# Patient Record
Sex: Male | Born: 1970 | Race: White | Hispanic: No | Marital: Married | Smoking: Former smoker
Health system: Southern US, Community
[De-identification: ages and names within clinical notes are randomized; demographics above are authoritative.]

## PROBLEM LIST (undated history)

## (undated) DIAGNOSIS — K219 Gastro-esophageal reflux disease without esophagitis: Secondary | ICD-10-CM

## (undated) DIAGNOSIS — G4733 Obstructive sleep apnea (adult) (pediatric): Secondary | ICD-10-CM

## (undated) DIAGNOSIS — M459 Ankylosing spondylitis of unspecified sites in spine: Secondary | ICD-10-CM

## (undated) DIAGNOSIS — M1990 Unspecified osteoarthritis, unspecified site: Secondary | ICD-10-CM

## (undated) DIAGNOSIS — F1910 Other psychoactive substance abuse, uncomplicated: Secondary | ICD-10-CM

## (undated) DIAGNOSIS — M542 Cervicalgia: Secondary | ICD-10-CM

## (undated) DIAGNOSIS — K7030 Alcoholic cirrhosis of liver without ascites: Secondary | ICD-10-CM

## (undated) DIAGNOSIS — M4810 Ankylosing hyperostosis [Forestier], site unspecified: Secondary | ICD-10-CM

## (undated) DIAGNOSIS — N200 Calculus of kidney: Secondary | ICD-10-CM

## (undated) DIAGNOSIS — E785 Hyperlipidemia, unspecified: Secondary | ICD-10-CM

## (undated) DIAGNOSIS — R011 Cardiac murmur, unspecified: Secondary | ICD-10-CM

## (undated) DIAGNOSIS — G4730 Sleep apnea, unspecified: Secondary | ICD-10-CM

## (undated) DIAGNOSIS — F329 Major depressive disorder, single episode, unspecified: Secondary | ICD-10-CM

## (undated) DIAGNOSIS — Z87442 Personal history of urinary calculi: Secondary | ICD-10-CM

## (undated) DIAGNOSIS — J302 Other seasonal allergic rhinitis: Secondary | ICD-10-CM

## (undated) DIAGNOSIS — U071 COVID-19: Secondary | ICD-10-CM

## (undated) DIAGNOSIS — R51 Headache: Secondary | ICD-10-CM

## (undated) DIAGNOSIS — G8929 Other chronic pain: Secondary | ICD-10-CM

## (undated) DIAGNOSIS — F419 Anxiety disorder, unspecified: Secondary | ICD-10-CM

## (undated) DIAGNOSIS — K769 Liver disease, unspecified: Secondary | ICD-10-CM

## (undated) DIAGNOSIS — T7840XA Allergy, unspecified, initial encounter: Secondary | ICD-10-CM

## (undated) DIAGNOSIS — M549 Dorsalgia, unspecified: Secondary | ICD-10-CM

## (undated) HISTORY — DX: Hyperlipidemia, unspecified: E78.5

## (undated) HISTORY — DX: Personal history of urinary calculi: Z87.442

## (undated) HISTORY — DX: Unspecified osteoarthritis, unspecified site: M19.90

## (undated) HISTORY — DX: Other seasonal allergic rhinitis: J30.2

## (undated) HISTORY — DX: COVID-19: U07.1

## (undated) HISTORY — DX: Liver disease, unspecified: K76.9

## (undated) HISTORY — DX: Dorsalgia, unspecified: M54.9

## (undated) HISTORY — PX: UPPER GASTROINTESTINAL ENDOSCOPY: SHX188

## (undated) HISTORY — DX: Alcoholic cirrhosis of liver without ascites: K70.30

## (undated) HISTORY — PX: ROTATOR CUFF REPAIR: SHX139

## (undated) HISTORY — DX: Headache: R51

## (undated) HISTORY — DX: Gastro-esophageal reflux disease without esophagitis: K21.9

## (undated) HISTORY — DX: Allergy, unspecified, initial encounter: T78.40XA

## (undated) HISTORY — DX: Obstructive sleep apnea (adult) (pediatric): G47.33

## (undated) HISTORY — DX: Calculus of kidney: N20.0

## (undated) HISTORY — DX: Cervicalgia: M54.2

## (undated) HISTORY — PX: COLONOSCOPY: SHX174

## (undated) HISTORY — DX: Other chronic pain: G89.29

## (undated) HISTORY — DX: Cardiac murmur, unspecified: R01.1

## (undated) HISTORY — DX: Other psychoactive substance abuse, uncomplicated: F19.10

## (undated) HISTORY — DX: Anxiety disorder, unspecified: F41.9

## (undated) HISTORY — DX: Major depressive disorder, single episode, unspecified: F32.9

## (undated) HISTORY — DX: Ankylosing hyperostosis (forestier), site unspecified: M48.10

## (undated) HISTORY — DX: Sleep apnea, unspecified: G47.30

## (undated) HISTORY — DX: Ankylosing spondylitis of unspecified sites in spine: M45.9

---

## 2001-02-26 ENCOUNTER — Encounter: Admission: RE | Admit: 2001-02-26 | Discharge: 2001-02-26 | Payer: Self-pay | Admitting: Family Medicine

## 2001-02-26 ENCOUNTER — Encounter: Payer: Self-pay | Admitting: Family Medicine

## 2004-10-09 ENCOUNTER — Emergency Department (HOSPITAL_COMMUNITY): Admission: EM | Admit: 2004-10-09 | Discharge: 2004-10-09 | Payer: Self-pay | Admitting: Emergency Medicine

## 2007-04-06 ENCOUNTER — Ambulatory Visit: Payer: Self-pay | Admitting: Internal Medicine

## 2007-09-21 ENCOUNTER — Ambulatory Visit: Payer: Self-pay | Admitting: Internal Medicine

## 2008-05-30 ENCOUNTER — Encounter: Payer: Self-pay | Admitting: Family Medicine

## 2008-06-22 ENCOUNTER — Ambulatory Visit: Payer: Self-pay | Admitting: Family Medicine

## 2008-06-25 DIAGNOSIS — Z87442 Personal history of urinary calculi: Secondary | ICD-10-CM | POA: Insufficient documentation

## 2008-06-25 DIAGNOSIS — J309 Allergic rhinitis, unspecified: Secondary | ICD-10-CM | POA: Insufficient documentation

## 2008-06-25 DIAGNOSIS — F411 Generalized anxiety disorder: Secondary | ICD-10-CM | POA: Insufficient documentation

## 2008-06-25 DIAGNOSIS — F329 Major depressive disorder, single episode, unspecified: Secondary | ICD-10-CM | POA: Insufficient documentation

## 2008-06-25 DIAGNOSIS — R519 Headache, unspecified: Secondary | ICD-10-CM | POA: Insufficient documentation

## 2008-06-25 DIAGNOSIS — R51 Headache: Secondary | ICD-10-CM | POA: Insufficient documentation

## 2008-06-25 DIAGNOSIS — F3289 Other specified depressive episodes: Secondary | ICD-10-CM | POA: Insufficient documentation

## 2008-06-25 DIAGNOSIS — K219 Gastro-esophageal reflux disease without esophagitis: Secondary | ICD-10-CM | POA: Insufficient documentation

## 2008-06-28 ENCOUNTER — Ambulatory Visit: Payer: Self-pay | Admitting: Family Medicine

## 2008-06-28 DIAGNOSIS — R5381 Other malaise: Secondary | ICD-10-CM | POA: Insufficient documentation

## 2008-06-28 DIAGNOSIS — R5383 Other fatigue: Secondary | ICD-10-CM

## 2008-07-03 ENCOUNTER — Encounter (INDEPENDENT_AMBULATORY_CARE_PROVIDER_SITE_OTHER): Payer: Self-pay | Admitting: *Deleted

## 2008-07-03 LAB — CONVERTED CEMR LAB
ALT: 55 units/L — ABNORMAL HIGH (ref 0–53)
AST: 34 units/L (ref 0–37)
Albumin: 4.7 g/dL (ref 3.5–5.2)
Alkaline Phosphatase: 33 units/L — ABNORMAL LOW (ref 39–117)
BUN: 11 mg/dL (ref 6–23)
Basophils Absolute: 0 10*3/uL (ref 0.0–0.1)
Basophils Relative: 0.4 % (ref 0.0–3.0)
Bilirubin, Direct: 0.1 mg/dL (ref 0.0–0.3)
CO2: 31 meq/L (ref 19–32)
Calcium: 9.5 mg/dL (ref 8.4–10.5)
Chloride: 107 meq/L (ref 96–112)
Cholesterol: 227 mg/dL — ABNORMAL HIGH (ref 0–200)
Creatinine, Ser: 0.9 mg/dL (ref 0.4–1.5)
Direct LDL: 171.6 mg/dL
Eosinophils Absolute: 0.3 10*3/uL (ref 0.0–0.7)
Eosinophils Relative: 5.2 % — ABNORMAL HIGH (ref 0.0–5.0)
GFR calc non Af Amer: 100.33 mL/min (ref >60)
Glucose, Bld: 95 mg/dL (ref 70–99)
HCT: 43.6 % (ref 39.0–52.0)
HDL: 45.2 mg/dL (ref >39.00)
Hemoglobin: 15.2 g/dL (ref 13.0–17.0)
Lymphocytes Relative: 30.4 % (ref 12.0–46.0)
Lymphs Abs: 1.5 10*3/uL (ref 0.7–4.0)
MCHC: 34.8 g/dL (ref 30.0–36.0)
MCV: 92.4 fL (ref 78.0–100.0)
Monocytes Absolute: 0.5 10*3/uL (ref 0.1–1.0)
Monocytes Relative: 10.3 % (ref 3.0–12.0)
Neutro Abs: 2.7 10*3/uL (ref 1.4–7.7)
Neutrophils Relative %: 53.7 % (ref 43.0–77.0)
Platelets: 208 10*3/uL (ref 150.0–400.0)
Potassium: 4.3 meq/L (ref 3.5–5.1)
RBC: 4.72 M/uL (ref 4.22–5.81)
RDW: 11.7 % (ref 11.5–14.6)
Sodium: 141 meq/L (ref 135–145)
Total Bilirubin: 0.9 mg/dL (ref 0.3–1.2)
Total CHOL/HDL Ratio: 5
Total Protein: 7.3 g/dL (ref 6.0–8.3)
Triglycerides: 92 mg/dL (ref 0.0–149.0)
VLDL: 18.4 mg/dL (ref 0.0–40.0)
WBC: 5 10*3/uL (ref 4.5–10.5)

## 2008-08-09 ENCOUNTER — Telehealth: Payer: Self-pay | Admitting: Family Medicine

## 2008-08-09 DIAGNOSIS — M79609 Pain in unspecified limb: Secondary | ICD-10-CM | POA: Insufficient documentation

## 2008-08-22 ENCOUNTER — Encounter: Payer: Self-pay | Admitting: Family Medicine

## 2008-09-05 ENCOUNTER — Ambulatory Visit: Payer: Self-pay | Admitting: Family Medicine

## 2008-09-05 DIAGNOSIS — J069 Acute upper respiratory infection, unspecified: Secondary | ICD-10-CM | POA: Insufficient documentation

## 2008-09-05 DIAGNOSIS — M542 Cervicalgia: Secondary | ICD-10-CM | POA: Insufficient documentation

## 2008-09-05 DIAGNOSIS — R21 Rash and other nonspecific skin eruption: Secondary | ICD-10-CM | POA: Insufficient documentation

## 2009-02-02 ENCOUNTER — Ambulatory Visit: Payer: Self-pay | Admitting: Family Medicine

## 2009-05-23 ENCOUNTER — Ambulatory Visit: Payer: Self-pay | Admitting: Family Medicine

## 2009-06-25 ENCOUNTER — Ambulatory Visit: Payer: Self-pay | Admitting: Family Medicine

## 2009-06-25 DIAGNOSIS — G4730 Sleep apnea, unspecified: Secondary | ICD-10-CM | POA: Insufficient documentation

## 2009-11-21 ENCOUNTER — Telehealth: Payer: Self-pay | Admitting: Family Medicine

## 2009-12-18 ENCOUNTER — Encounter: Payer: Self-pay | Admitting: Family Medicine

## 2009-12-28 ENCOUNTER — Telehealth: Payer: Self-pay | Admitting: Family Medicine

## 2010-01-17 ENCOUNTER — Ambulatory Visit: Payer: Self-pay | Admitting: Family Medicine

## 2010-03-19 NOTE — Assessment & Plan Note (Signed)
Summary: CHEST CONGESTION, COUGHING, SNEEZING, RUNNY NOSE / LFW   Vital Signs:  Patient profile:   40 year old male Height:      70.5 inches Weight:      208.25 pounds BMI:     29.56 Temp:     98.4 degrees F oral Pulse rate:   76 / minute Pulse rhythm:   regular BP sitting:   128 / 82  (left arm) Cuff size:   large  Vitals Entered By: Lewanda Rife LPN (February 02, 2009 2:12 PM)  CC:  chest congestion, sneezing, productive cough, and drainage in throat.  History of Present Illness: Here for chest and head congestion--onset x 5d--increased nasal congestion yesterday, no fever or chills, cough started keeping awake last night --has been using mucinex, IBP 800 two times a day as needed   Allergies: 1)  ! Omnicef  Review of Systems      See HPI  Physical Exam  General:  alert, well-developed, well-nourished, and well-hydrated.   Ears:  TMs retracted with increased fluid Nose:  no airflow obstruction, mucosal erythema, and mucosal edema.  sinuses neg Mouth:  no exudates and pharyngeal erythema.   Lungs:  moist cough only, no crackles and no wheezes.   Neurologic:  alert & oriented X3 and gait normal.   Cervical Nodes:  no anterior cervical adenopathy and no posterior cervical adenopathy.   Psych:  normally interactive and subdued.     Impression & Recommendations:  Problem # 1:  URI (ICD-465.9) continue comfort care measures: increase po fluids, rest, tylenol or IBP as needed delsym in daytime hycodan in evening see back if worsens His updated medication list for this problem includes:    Mucinex Dm Maximum Strength 60-1200 Mg Xr12h-tab (Dextromethorphan-guaifenesin) ..... Otc as directed.    Ibuprofen 200 Mg Tabs (Ibuprofen) ..... Otc as directed.  Complete Medication List: 1)  Flonase 50 Mcg/act Susp (Fluticasone propionate) .... Two sprays each nostril daily 2)  Cyclobenzaprine Hcl 10 Mg Tabs (Cyclobenzaprine hcl) .... 1/2 - 1  by mouth 3 times daily as needed for  back pain 3)  Hycodan Syrup  .Marland Kitchen.. 1 - 2 tsp by mouth at bedtime as needed cough 4)  Sertraline Hcl 50 Mg Tabs (Sertraline hcl) .... One by mouth daily 5)  Mucinex Dm Maximum Strength 60-1200 Mg Xr12h-tab (Dextromethorphan-guaifenesin) .... Otc as directed. 6)  Ibuprofen 200 Mg Tabs (Ibuprofen) .... Otc as directed. 7)  Hycodan  .Marland Kitchen.. 1-2 tsp at bedtime for cough Prescriptions: HYCODAN 1-2 tsp at bedtime for cough  #136ml x -   Entered and Authorized by:   Gildardo Griffes FNP   Signed by:   Gildardo Griffes FNP on 02/02/2009   Method used:   Print then Give to Patient   RxID:   [id number]   Current Allergies (reviewed today): ! OMNICEF

## 2010-03-19 NOTE — Progress Notes (Signed)
Summary: diclofenac  Phone Note Refill Request Message from:  [employer] on December 28, 2009 10:45 AM  Refills Requested: Medication #1:  Duckifebac Sodium EC 75   Supply Requested: 1 month   Last Refilled: 03/04/2009   Notes: Not [facility] list [facility] [phone]   Method Requested: Electronic Initial call taken by: Benny Lennert CMA Duncan Dull),  December 28, 2009 10:47 AM  Follow-up for Phone Call        voltaren 75 mg, 1 by mouth two times a day, #60, 1 refill  send in [facility] MD  December 28, 2009 11:16 AM   Additional Follow-up for Phone Call Additional follow up Details #1::        Rx called to pharmacy Additional Follow-up by: Benny Lennert CMA Duncan Dull),  December 28, 2009 11:24 AM     Prior Medications: FLONASE 50 MCG/ACT SUSP (FLUTICASONE PROPIONATE) two sprays each nostril daily CYCLOBENZAPRINE HCL 10 MG  TABS (CYCLOBENZAPRINE HCL) 1/2 - 1  by mouth 3 times daily as needed for back pain SERTRALINE HCL 50 MG TABS (SERTRALINE HCL) 2 tabs by mouth daily Current Allergies: ! OMNICEF

## 2010-03-19 NOTE — Assessment & Plan Note (Signed)
Summary: CONGESTION,ST/CLE   Vital Signs:  Patient profile:   40 year old male Weight:      203.50 pounds O2 Sat:      99 % on Room air Temp:     98.9 degrees F oral Pulse rate:   100 / minute Pulse rhythm:   regular Resp:     24 per minute BP sitting:   140 / 100  (left arm) Cuff size:   large  Vitals Entered By: Sydell Axon LPN (January 17, 2010 3:46 PM)  O2 Flow:  Room air CC: Top of mouth is sore, productive cough, head congestion and feels bad   History of Present Illness: Sxs began Sun evening. He then developed pain in the left side nof the roof of hius mouth yesterday. He has not had fever but has had chills. He is either burning up or ice cold. he has headache begiining Tues bitemporally/frontally, better today (last dose of Aleve yest afternoon), rhinitis that is clear, no ear pain, ST which is now gone except for the roof of his mouth, cough productive of cloudy material. He denies V, some mild nausea. He has taken Aleve for two days, Mucinex since Tues, last night got Mucinex DM for dry cough.  Problems Prior to Update: 1)  Sleep Apnea  (ICD-780.57) 2)  Neck Pain  (ICD-723.1) 3)  Rash-nonvesicular  (ICD-782.1) 4)  Uri  (ICD-465.9) 5)  Foot Pain  (ICD-729.5) 6)  Other Malaise and Fatigue  (ICD-780.79) 7)  Screening For Diabetes Mellitus  (ICD-V77.1) 8)  Screening For Lipoid Disorders  (ICD-V77.91) 9)  Nephrolithiasis, Hx of  (ICD-V13.01) 10)  Headache  (ICD-784.0) 11)  Gerd  (ICD-530.81) 12)  Depression  (ICD-311) 13)  Anxiety  (ICD-300.00) 14)  Allergic Rhinitis  (ICD-477.9) 15)  Health Maintenance Exam  (ICD-V70.0)  Medications Prior to Update: 1)  Flonase 50 Mcg/act Susp (Fluticasone Propionate) .... Two Sprays Each Nostril Daily 2)  Cyclobenzaprine Hcl 10 Mg  Tabs (Cyclobenzaprine Hcl) .... 1/2 - 1  By Mouth 3 Times Daily As Needed For Back Pain 3)  Sertraline Hcl 50 Mg Tabs (Sertraline Hcl) .... 2 Tabs By Mouth Daily  Current Medications  (verified): 1)  Flonase 50 Mcg/act Susp (Fluticasone Propionate) .... Two Sprays Each Nostril Daily 2)  Cyclobenzaprine Hcl 10 Mg  Tabs (Cyclobenzaprine Hcl) .... 1/2 - 1  By Mouth 3 Times Daily As Needed For Back Pain 3)  Sertraline Hcl 50 Mg Tabs (Sertraline Hcl) .... 2 Tabs By Mouth Daily 4)  C-Pap Machine .... Use At Nighjt 5)  Norco 5-325 Mg Tabs (Hydrocodone-Acetaminophen) .... Take One By Mouth Three Times A Day As Needed 6)  Kenalog 40 Mg/ml Susp (Triamcinolone Acetonide)  Allergies: 1)  ! Omnicef  Physical Exam  General:  Well-developed,well-nourished,in no acute distress; alert,appropriate and cooperative throughout examination Head:  Normocephalic and atraumatic without obvious abnormalities. No apparent alopecia or balding. Sinuses NT. Eyes:  Conjunctiva clear bilaterally.  Ears:  External ear exam shows no significant lesions or deformities.  Otoscopic examination reveals clear canals, tympanic membranes are intact bilaterally without bulging, retraction, inflammation or discharge. Hearing is grossly normal bilaterally. Nose:  External nasal examination shows no deformity or inflammation. Nasal mucosa are pink and moist without lesions or exudates. Mouth:  No PND or pharyngeal exudates. Left side of posterioir portion of hard palate eryhtem with early formation of apthous ulcer. Neck:  No deformities, masses, or tenderness noted. Lungs:  Normal respiratory effort, chest expands symmetrically. Lungs are clear  to auscultation, no crackles or wheezes. Heart:  Normal rate and regular rhythm. S1 and S2 normal without gallop, murmur, click, rub or other extra sounds. Cervical Nodes:  No lymphadenopathy noted   Impression & Recommendations:  Problem # 1:  URI (ICD-465.9) Assessment Deteriorated Recuurent, actually looks like developing flu.  See instructions. He has had this long enough that I do not think antiviral would help.   Complete Medication List: 1)  Flonase 50  Mcg/act Susp (Fluticasone propionate) .... Two sprays each nostril daily 2)  Cyclobenzaprine Hcl 10 Mg Tabs (Cyclobenzaprine hcl) .... 1/2 - 1  by mouth 3 times daily as needed for back pain 3)  Sertraline Hcl 50 Mg Tabs (Sertraline hcl) .... 2 tabs by mouth daily 4)  C-pap Machine  .... Use at nighjt 5)  Norco 5-325 Mg Tabs (Hydrocodone-acetaminophen) .... Take one by mouth three times a day as needed 6)  Kenalog 40 Mg/ml Susp (Triamcinolone acetonide)  Patient Instructions: 1)  Take Guaifenesin by [facility] or [facility] and getting MUCOUS RELIEF EXPECTORANT (400mg ), take 11/2 tabs by mouth AM and NOON. 2)  Drink lots of fluids anytime taking Guaifenesin.  3)  Aleve 2 tabs by mouth after brfst and supper. 4)  Kenalog in orabase to roof of mouth. 5)  Delsym for cough at night as needed.   Orders Added: 1)  Est. Patient Level III [65784]    Current Allergies (reviewed today): ! OMNICEF

## 2010-03-19 NOTE — Progress Notes (Signed)
Summary: cyclobenzaprine  Phone Note Refill Request Message from:  Fax from Pharmacy on November 21, 2009 10:00 AM  Refills Requested: Medication #1:  CYCLOBENZAPRINE HCL 10 MG  TABS 1/2 - 1  by mouth 3 times daily as needed for back pain   Last Refilled: 07/02/2009 Refill request from [facility] [address] [phone]  Initial call taken by: Melody Comas,  November 21, 2009 10:01 AM    Prescriptions: CYCLOBENZAPRINE HCL 10 MG  TABS (CYCLOBENZAPRINE HCL) 1/2 - 1  by mouth 3 times daily as needed for back pain  #40 x 2   Entered and Authorized by:   Hannah Beat MD   Signed by:   Hannah Beat MD on 11/21/2009   Method used:   [facility]  [address] [phone]       [address]       [place], [place]  [address]       Ph: [phone] or [phone]       Fax: [phone]   RxID:   [id number]

## 2010-03-19 NOTE — Letter (Signed)
Summary: Generic Letter  [facility] at [facility]  [address] [place], [place] [address]   Phone: [phone]  Fax: [phone]    07/03/2008     HENDRY SPEAS [address] [place], [place]  [address]     Dear Mr. WAMPOLE,     [place] labs look OK, cholesterol panel is high.  Cholesterol Panel Total Cholesterol: 227 LDL (Bad cholesterol): 171 HDL (Good cholesterol): 45 Triglyerides: 92  With your age and risk factors, reasonable to start with aggressive diet and exercise changes. Add Fish oil 2 grams a day to diet. Work on your diet: Try to eat minimal fatty foods, plenty of fiber, fruit, and vegetables. Exercise is incredibly important to heart health: Try to exercise for at least 30 minutes 5-6 times a week.   No diabetes, liver, or kidney problems, no anemia.  Take care.       Sincerely,   Hannah Beat MD

## 2010-03-19 NOTE — Consult Note (Signed)
Summary: [facility]   Imported By: Lanelle Bal 09/05/2008 09:06:20  _____________________________________________________________________  External Attachment:    Type:   Image     Comment:   External Document

## 2010-03-19 NOTE — Letter (Signed)
Summary: [facility]/Notes from 07/09/06-05/30/08/Immunizat  [facility]/Notes from 07/09/06-05/30/08/Immunizations/Dr. Raupp   Imported By: Eleonore Chiquito 07/05/2008 16:28:02  _____________________________________________________________________  External Attachment:    Type:   Image     Comment:   External Document

## 2010-03-19 NOTE — Assessment & Plan Note (Signed)
Summary: NEW PT TO EST/CLE   Vital Signs:  Patient profile:   40 year old male Height:      70.5 inches Weight:      187.25 pounds BMI:     26.58 Temp:     98.5 degrees F oral Pulse rate:   80 / minute Pulse rhythm:   regular BP sitting:   128 / 80  (left arm) Cuff size:   large  Vitals Entered By: Lewanda Rife (Jun 22, 2008 11:01 AM)  History of Present Illness: chief complaint: new patient  40 year old male: the patient is here for a complete physical examination. And well maintenance exam.  Anxiety, has been on Zoloft, for a lont time and it changed his life. Was off for four or four years, tried cymbalta, lexapro. All had bad side effects.  now, he is able to tolerate taking Zoloft 100 mg daily, he is having some problems with prolonged intercourse and occasional lengthening of time to organism. To him the benefit has outweighed the risk.  Otherwise we're here to do a full complete examination, and we'll review his preventative services needed, and reviewed his past medical history, family history, social history surgical history, and doing a complete physical examination.  He thinks he has had Td in the past 2-3 years from an accident, but not sure of date. We will obtain records from the patient's prior physicians.   Preventive Screening-Counseling & Management     Alcohol drinks/day: <1     Alcohol type: beer     >5/day in last 3 mos: no     Alcohol Counseling: not indicated; use of alcohol is not excessive or problematic     Smoking Status: quit     Smoking Cessation Counseling: no     Year Quit: 2002     Pack years: 7     Cans of tobacco/week: 0     Tobacco Counseling: to remain off tobacco products     Diet Comments: suboptimal     Diet Counseling: to improve diet; diet is suboptimal     Does Patient Exercise: no     Exercise Counseling: to improve exercise regimen     Hepatitis Risk: no risk noted     Hepatitis Risk Counseling: not indicated-no hepatitis risk  noted     HIV Risk: no risk noted     HIV Risk Counseling: not indicated-no HIV risk noted     STD Risk: no risk noted     STD Risk Counseling: not indicated-no STD risk noted     Contraception Counseling: not indicated; no questions/concerns expressed     Violence in the Home: no risk noted     Sexual Abuse: no     Fall Risk Counseling: not indicated; no significant falls noted      Sexual History:  currently monogamous.        Drug Use:  never and no.        Blood Transfusions:  no.    Allergies (verified): 1)  ! Omnicef (Cefdinir)  Past History:  Past Medical History:    Allergic rhinitis    Anxiety    Depression    GERD    Headache    Nephrolithiasis, hx of    Chronic neck and back pain  Past Surgical History:    no  Family History:    Alcoholism, Drug Addiction: 0    Colon CA: 0    Ovarian/Uterine CA: 0  Breast CA: 0    Lung CA: 0    Prostate CA: 0    CAD: P    CVA: GP    Sudden death < 50: 0    DM: P    Mental Illness: 0     HTN: +  Social History:    Psychiatric nurse, [employer]    Quit smoking 2002, about 7 pack years    Alcohol use-yes    Drug use-no    Regular exercise-no    Drug Use:  never, no    Does Patient Exercise:  no    Smoking Status:  quit    Hepatitis Risk:  no risk noted    HIV Risk:  no risk noted    STD Risk:  no risk noted    Sexual History:  currently monogamous    Blood Transfusions:  no  Review of Systems  General: Denies fever, chills, sweats, anorexia, fatigue, weakness, malaise Eyes: Denies blurring, vision loss ENT: Denies earache, nasal congestion, nosebleeds, sore throat, and hoarseness.  Cardiovascular: Denies chest pains, palpitations, syncope, dyspnea on exertion,  Respiratory: Denies cough, dyspnea at rest, excessive sputum,wheeezing GI: Denies nausea, vomiting, diarrhea, constipation, change in bowel habits, abdominal pain, melena, hematochezia GU: Denies dysuria, hematuria, discharge, urinary  frequency, urinary hesitancy, nocturia, incontinence, genital sores, decreased libido - DOES HAVE SOME ANORGASMIA WITH THE ANTIDEPRESSANT USE Musculoskeletal: Denies back pain, joint pain Derm: Denies rash, itching Neuro: Denies  paresthesias, frequent falls, frequent headaches, and difficulty walking.  Psych: Denies depression, anxiety Endocrine: Denies cold intolerance, heat intolerance, polydipsia, polyphagia, polyuria, and unusual weight change.  Heme: Denies enlarged lymph nodes Allergy: No hayfever   Otherwise, the pertinent positives and negatives are listed above and in the HPI, otherwise a full review of systems has been reviewed and is negative unless noted positive.    Impression & Recommendations:  Problem # 1:  HEALTH MAINTENANCE EXAM (ICD-V70.0) Assessment New The patient's preventative maintenance and recommended screening tests for an annual wellness exam were reviewed in full today, and the patient was brought up to date and current.  Reviewed the [employer], Prevention tab. Up to Date.  Counselled on the importance of diet, exercise, and its role in overall health and mortality. The patient's FH and SH was reviewed, including their home life, tobacco status, and drug and alcohol status.  Also given refills on Flonase and Zoloft.  follow-up in 1 year.   Complete Medication List: 1)  Sertraline Hcl 100 Mg Tabs (Sertraline hcl) .Marland Kitchen.. 1 by mouth daily 2)  Flonase 50 Mcg/act Susp (Fluticasone propionate) .... Two sprays each nostril daily  Patient Instructions: 1)  Please make a future lab appointment: Stop at the front desk on your way out to set it up. You can come in that day directly and will not have to see the doctor. 2)  future fasting lipid panel 3)  v77.91  4)  BMP: v77.1 5)  Hepatic Function Panel: v58.69 6)  CBC with diff: 780.79 7)  TSH: 780.79  8)  follow-up as needed Prescriptions: FLONASE 50 MCG/ACT SUSP (FLUTICASONE PROPIONATE) two sprays each  nostril daily  #3 x 3   Entered and Authorized by:   Hannah Beat MD   Signed by:   Hannah Beat MD on 06/22/2008   Method used:   Print then Give to Patient   RxID:   [id number] SERTRALINE HCL 100 MG TABS (SERTRALINE HCL) 1 by mouth daily  #90 x 3   Entered  and Authorized by:   Hannah Beat MD   Signed by:   Hannah Beat MD on 06/22/2008   Method used:   Print then Give to Patient   RxID:   [id number]     Current Allergies (reviewed today): ! OMNICEF (CEFDINIR)  Review of  Systems General: denies fatigue, malaise, fever, weight loss Eyes: denies blurring, diplopia, irritation, discharge Ear/Nose/Throat: denies ear pain or discharge, nasal obstruction or discharge, sore throat Cardiovascular: denies chest pain, palpitations, paroxysmal nocturnal dyspnea, orthopnea, edema Respiratory: denies coughing, wheezing, dyspnea, hemoptysis Gastrointestinal: denies abdominal pain, dysphagia, nausea, vomiting, diarrhea, constipation Genitourinary: denies hematuria, frequency, urgency, dysuria, discharge, O/W ANORGASMIA AS PER HPI Musculoskeletal: denies back pain, joint swelling, joint stiffness, joint pain Skin: denies rashes, itching, lumps, sores, lesions, color change Neurologic: denies syncope, seizures, transient paralysis, weakness, paresthesias Psychiatric: denies depression, anxiety, mental disturbance, difficulty sleeping, suicidal ideation, hallucinations, paranoia Endocrine: denies polyuria, polydipsia, polyphagia, weight change, heat or cold intolerance Heme/Lymphatic: denies easy or excessive bruising, history of blood transfusions, anemia, bleeding disorders, adenopathy, chills, sweats Allergic/Immunologic: HAS SOME ALLERGIES, RELATIVELY STABLE WITH FLONASE AND OTC MEDS, denies frequent UTIs; denies HIV high risk behaviors   Physical Exam General Appearance: well developed, well nourished, no acute distress Eyes: conjunctiva and lids normal, PERRLA,  EOMI, fundi WNL Ears, Nose, Mouth, Throat: TM clear, nares clear, oral exam WNL Neck: supple, no lymphadenopathy, no thyromegaly, no JVD Respiratory: clear to auscultation and percussion, respiratory effort normal Cardiovascular: regular rate and rhythm, S1-S2, no murmur, rub or gallop, no bruits, peripheral pulses normal and symmetric, no cyanosis, clubbing, edema or varicosities Chest: no scars, masses, tenderness; no asymmetry, skin changes, nipple discharge, no gynecomastia   Gastrointestinal: soft, non-tender; no hepatosplenomegaly, masses; active bowel sounds all quadrants, guaiac negative stool; no masses, tenderness, hemorrhoids  Genitourinary: deferred Lymphatic: no cervical, axillary or inguinal adenopathy Musculoskeletal: gait normal, muscle tone and strength WNL, no joint swelling, effusions, discoloration, crepitus  Skin: clear, good turgor, color WNL, no rashes, lesions, or ulcerations Neurologic: normal mental status, normal reflexes, normal strength, sensation, and motion Psychiatric: alert; oriented to person, place and time Other Exam:

## 2010-03-19 NOTE — Assessment & Plan Note (Signed)
Summary: FEVER   Vital Signs:  Patient profile:   40 year old male Height:      70.5 inches Weight:      201.6 pounds BMI:     29.56 Temp:     98.0 degrees F oral Pulse rate:   76 / minute Pulse rhythm:   regular BP sitting:   110 / 70  (left arm) Cuff size:   large  Vitals Entered By: Benny Lennert CMA Duncan Dull) (May 23, 2009 3:38 PM)  History of Present Illness: Chief complaint fever,extreme fatigue and vomited once  40 year old male:  Feeling some extreme fatigue, aches, pains.  Threw up on saturday night.   Temperature has been as high as 100. Woke up this morning, felt like there was something in his left ear.   No congestion. A little cough, but has had some allergies.   Temperatire and felt fatigued.  Came on and has been feeling really tired.   has had a lot of muscle aches on the left side.    Acute Visit History:      The patient complains of earache, fever, headache, musculoskeletal symptoms, nausea, and vomiting.  These symptoms began 5 days ago.  He denies chest pain, cough, diarrhea, eye symptoms, genitourinary symptoms, nasal discharge, rash, sinus problems, and sore throat.        The earache is located on the left side.        Urine output has been normal.  He is tolerating clear liquids.        Allergies: 1)  ! Omnicef  Past History:  Past medical, surgical, family and social histories (including risk factors) reviewed, and no changes noted (except as noted below).  Past Medical History: Reviewed history from 06/22/2008 and no changes required. Allergic rhinitis Anxiety Depression GERD Headache Nephrolithiasis, hx of Chronic neck and back pain  Past Surgical History: Reviewed history from 06/22/2008 and no changes required. no  Family History: Reviewed history from 06/22/2008 and no changes required. Alcoholism, Drug Addiction: 0 Colon CA: 0 Ovarian/Uterine CA: 0 Breast CA: 0 Lung CA: 0 Prostate CA: 0 CAD: P CVA: GP Sudden death  < 50: 0 DM: P Mental Illness: 0  HTN: +  Social History: Reviewed history from 06/22/2008 and no changes required. Psychiatric nurse, [employer] Quit smoking 2002, about 7 pack years Alcohol use-yes Drug use-no Regular exercise-no  Review of Systems       REVIEW OF SYSTEMS GEN: Acute illness details above. CV: No chest pain or SOB GI: as above Otherwise, pertinent positives and negatives are noted in the HPI.   Physical Exam  Additional Exam:  Gen: WDWN, NAD; A & O x3, cooperative. Pleasant.Globally Non-toxic HEENT: Normocephalic and atraumatic. Throat clear, w/o exudate, R TM clear, L TM - good landmarks, No fluid present. no rhinnorhea. No frontal or maxillary sinus T. MMM NECK: Anterior cervical  LAD is present CV: RRR, No M/G/R, cap refill <2 sec PULM: Breathing comfortably in no respiratory distress. no wheezing, crackles, rhonchi ABD: S,NT,ND,+BS. No HSM. No rebound. EXT: No c/c/e PSYCH: Friendly, good eye contact MSK: Nml gait    Impression & Recommendations:  Problem # 1:  VIRAL INFECTION-UNSPEC (ICD-079.99) Assessment New H1N1 flu? arthralgias predominant, some fever, nausea will treat for tick borne illness with doxy - spring in [place]  The following medications were removed from the medication list:    Mucinex Dm Maximum Strength 60-1200 Mg Xr12h-tab (Dextromethorphan-guaifenesin) ..... Otc as directed.  Ibuprofen 200 Mg Tabs (Ibuprofen) ..... Otc as directed.  Problem # 2:  FEVER (ICD-780.60) Assessment: New  Complete Medication List: 1)  Flonase 50 Mcg/act Susp (Fluticasone propionate) .... Two sprays each nostril daily 2)  Cyclobenzaprine Hcl 10 Mg Tabs (Cyclobenzaprine hcl) .... 1/2 - 1  by mouth 3 times daily as needed for back pain 3)  Sertraline Hcl 50 Mg Tabs (Sertraline hcl) .... 2 tabs by mouth daily 4)  Doxycycline Hyclate 100 Mg Caps (Doxycycline hyclate) .... Take 1 tab twice a day Prescriptions: SERTRALINE HCL 50 MG TABS  (SERTRALINE HCL) 2 tabs by mouth daily  #60 x 3   Entered and Authorized by:   Hannah Beat MD   Signed by:   Hannah Beat MD on 05/23/2009   Method used:   Electronically to        [facility]  [address] [phone]* (retail)       [address]       [place], [place]  [address]       Ph: [phone] or [phone]       Fax: [phone]   RxID:   [id number] DOXYCYCLINE HYCLATE 100 MG CAPS (DOXYCYCLINE HYCLATE) Take 1 tab twice a day  #20 x 0   Entered and Authorized by:   Hannah Beat MD   Signed by:   Hannah Beat MD on 05/23/2009   Method used:   Electronically to        [facility]  [address] [phone]* (retail)       [address]       [place], [place]  [address]       Ph: [phone] or [phone]       Fax: [phone]   RxID:   [id number]   Current Allergies (reviewed today): ! OMNICEF

## 2010-03-19 NOTE — Assessment & Plan Note (Signed)
Summary: NECK PAIN,ST,COUGH,BITES ON LEG/CLE   Vital Signs:  Patient profile:   40 year old male Height:      70.5 inches Weight:      195.4 pounds BMI:     27.74 Temp:     98.0 degrees F oral Pulse rate:   80 / minute Pulse rhythm:   regular BP sitting:   140 / 90  (left arm) Cuff size:   regular  Vitals Entered By: Benny Lennert CMA (September 05, 2008 12:21 PM)  History of Present Illness: Chief complaint neck pain times 2 weeks/sorethroat and cough since saturday/ bites on legs for 1 and half weeks  40 year old male:  Started to have a sore throat on saturday. Cough and mucinex DM also has some nasal congestion. He does not have any known sick exposure. He is able to eat and drink, though diminished somewhat. No nausea, vomiting, diarrhea, and rashes are detailed below. Has had some headaches and back pain and some pain   rash: Intermittent very small papules will ask, subsequently these become highly pruritic and the patient will scratch them to the point of bleeding. No known exposure, though the patient is frequently outside gardening.  Neck pain: Patient does have intermittent neck pain 3-4 times a year, he has been trying some heat, some stretching and modalities he has a home. No radiculopathy or weakness.  Allergies: 1)  ! Omnicef (Cefdinir)  Past History:  Past medical, surgical, family and social histories (including risk factors) reviewed, and no changes noted (except as noted below).  Past Medical History: Reviewed history from 06/22/2008 and no changes required. Allergic rhinitis Anxiety Depression GERD Headache Nephrolithiasis, hx of Chronic neck and back pain  Past Surgical History: Reviewed history from 06/22/2008 and no changes required. no  Family History: Reviewed history from 06/22/2008 and no changes required. Alcoholism, Drug Addiction: 0 Colon CA: 0 Ovarian/Uterine CA: 0 Breast CA: 0 Lung CA: 0 Prostate CA: 0 CAD: P CVA:  GP Sudden death < 50: 0 DM: P Mental Illness: 0  HTN: +  Social History: Reviewed history from 06/22/2008 and no changes required. 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 ENT:  Complains of nasal congestion. CV:  Denies chest pain or discomfort. Resp:  Complains of cough; denies shortness of breath, sputum productive, and wheezing. GI:  Denies nausea and vomiting. GU:  Denies dysuria. MS:  Complains of stiffness. Derm:  Complains of rash.  Physical Exam  General:  Well-developed,well-nourished,in no acute distress; alert,appropriate and cooperative throughout examination Head:  Normocephalic and atraumatic without obvious abnormalities. No apparent alopecia or balding. Eyes:  vision grossly intact.   Ears:  External ear exam shows no significant lesions or deformities.  Otoscopic examination reveals clear canals, tympanic membranes are intact bilaterally without bulging, retraction, inflammation or discharge. Hearing is grossly normal bilaterally. Nose:  no external deformity.   Mouth:  Oral mucosa and oropharynx without lesions or exudates.  Teeth in good repair. Neck:  No deformities, masses, or tenderness noted. Lungs:  Normal respiratory effort, chest expands symmetrically. Lungs are clear to auscultation, no crackles or wheezes. Heart:  Normal rate and regular rhythm. S1 and S2 normal without gallop, murmur, click, rub or other extra sounds. Abdomen:  Bowel sounds positive,abdomen soft and non-tender without masses, organomegaly or hernias noted. Msk:  mild limitation of cervical neck range of motion with terminal lateral flexion, flexion, extension, and rotational motions. Spurling's test is negative. Nontender along cervical spine itself. Mildly tender along the paraspinous musculature in upper neck and  trapezius. Extremities:  No clubbing, cyanosis, edema, or deformity noted with normal full range of motion of all joints.   Neurologic:  alert & oriented X3, sensation intact to light touch, and gait normal.   Skin:  very small papules with evidence of excoriation present on the bilateral lower extremities including tops of feet, also present on the distal forearms. There is no evidence of burrowing or evidence in the interdigital spaces. Cervical Nodes:  No lymphadenopathy noted Psych:  Cognition and judgment appear intact. Alert and cooperative with normal attention span and concentration. No apparent delusions, illusions, hallucinations   Impression & Recommendations:  Problem # 1:  URI (ICD-465.9) Assessment New Supportive care Hycodan as needed at night Nontoxic and AF  His updated medication list for this problem includes:    Diclofenac Sodium 75 Mg Tbec (Diclofenac sodium) .Marland Kitchen... 1 by mouth bid. take with food  Problem # 2:  RASH-NONVESICULAR (ICD-782.1) Assessment: New appears most look like small insect bites, such as fleas or possibly chigger bites.  TAC to bites, [place] with going outside  His updated medication list for this problem includes:    Triamcinolone Acetonide 0.1 % Crea (Triamcinolone acetonide) .Marland Kitchen... Apply bid to affected area  Problem # 3:  NECK PAIN (ICD-723.1) Assessment: New Heat, ROM, NSAIDS, muscle relaxants  no concern for disk herniation  His updated medication list for this problem includes:    Diclofenac Sodium 75 Mg Tbec (Diclofenac sodium) .Marland Kitchen... 1 by mouth bid. take with food    Cyclobenzaprine Hcl 10 Mg Tabs (Cyclobenzaprine hcl) .Marland Kitchen... 1/2 - 1  by mouth 3 times daily as needed for back pain  Complete Medication List: 1)  Sertraline Hcl 100 Mg Tabs (Sertraline hcl) .Marland Kitchen.. 1 by mouth daily 2)  Flonase 50 Mcg/act Susp (Fluticasone propionate) .... Two sprays each nostril daily 3)  Triamcinolone Acetonide 0.1 % Crea (Triamcinolone acetonide) ....  Apply bid to affected area 4)  Diclofenac Sodium 75 Mg Tbec (Diclofenac sodium) .Marland Kitchen.. 1 by mouth bid. take with food 5)  Cyclobenzaprine Hcl 10 Mg Tabs (Cyclobenzaprine hcl) .... 1/2 - 1  by mouth 3 times daily as needed for back pain 6)  Hycodan Syrup  .Marland Kitchen.. 1 - 2 tsp by mouth at bedtime as needed cough Prescriptions: HYCODAN SYRUP 1 - 2 tsp by mouth at bedtime as needed cough  #8 oz x 0   Entered and Authorized by:   Hannah Beat MD   Signed by:   Hannah Beat MD on 09/05/2008   Method used:   Print then Give to Patient   RxID:   [id number] CYCLOBENZAPRINE HCL 10 MG  TABS (CYCLOBENZAPRINE HCL) 1/2 - 1  by mouth 3 times daily as needed for back pain  #40 x 2   Entered and Authorized by:   Hannah Beat MD   Signed by:   Hannah Beat MD on 09/05/2008   Method used:   Print then Give to Patient   RxID:   [id number] DICLOFENAC SODIUM 75 MG TBEC (DICLOFENAC SODIUM) 1 by mouth bid. take with food  #60 x 3   Entered and Authorized by:   Hannah Beat MD   Signed by:   Hannah Beat MD on 09/05/2008   Method used:   Print then Give to Patient  RxID:   [id number] TRIAMCINOLONE ACETONIDE 0.1 % CREA (TRIAMCINOLONE ACETONIDE) apply bid to affected area  #1 pound jar x 0   Entered and Authorized by:   Hannah Beat MD   Signed by:   Hannah Beat MD on 09/05/2008   Method used:   Print then Give to Patient   RxID:   [id number]   Current Allergies (reviewed today): ! OMNICEF (CEFDINIR)

## 2010-03-19 NOTE — Progress Notes (Signed)
Summary: wants podiatry referral  Phone Note Call from Patient Call back at [phone]   Caller: Patient Call For: Hannah Beat MD Summary of Call: pt c/o R foot pain he wants referral to podiatrist. Initial call taken by: Liane Comber,  August 09, 2008 1:09 PM  Follow-up for Phone Call        ok Follow-up by: Hannah Beat MD,  August 09, 2008 1:37 PM  New Problems: FOOT PAIN (ICD-729.5)   New Problems: FOOT PAIN (ICD-729.5)

## 2010-03-19 NOTE — Assessment & Plan Note (Signed)
Summary: snoring issues   Vital Signs:  Patient profile:   40 year old male Height:      70.5 inches Weight:      203.2 pounds BMI:     28.85 Temp:     98.1 degrees F oral Pulse rate:   76 / minute Pulse rhythm:   regular BP sitting:   110 / 80  (left arm) Cuff size:   regular  Vitals Entered By: Benny Lennert CMA Duncan Dull) (Jun 25, 2009 12:28 PM)  CC:  evaluation of snoring for initial evaluation.  History of Present Illness: Chief complaint snoring issues  39 year old male:  Sleeping 10 hours if he can on average sleeps 7 or 8         This is a 39 year old male who presents for consultation at the request of his referring provider for evaluation of snoring for initial evaluation.  The patient presents with snoring, sleep apnea, daytime fatigue, post nasal drainage, runny nose, and stuffiness, but has no history of night time gasping, choking at night, periods of breath holding, sore throat, hoarseness, voice change/problems, hearing loss, and dizziness.  The patient has a positive history of allergies.  Previous evaluation and treatment has included antihistamines:, decongestants:, OTC nasal sprays, prescription nasal sprays:, and nasal corticosteroids.  Complications and adverse impact from the snoring include excessive daytime fatigue and partner unable to sleep in same room.    Allergies: 1)  ! Omnicef  Past History:  Past medical, surgical, family and social histories (including risk factors) reviewed, and no changes noted (except as noted below).  Past Medical History: Reviewed history from 06/22/2008 and no changes required. Allergic rhinitis Anxiety Depression GERD Headache Nephrolithiasis, hx of Chronic neck and back pain  Past Surgical History: Reviewed history from 06/22/2008 and no changes required. no  Family History: Reviewed history from 06/22/2008 and no changes required. Alcoholism, Drug Addiction: 0 Colon CA: 0 Ovarian/Uterine CA: 0 Breast  CA: 0 Lung CA: 0 Prostate CA: 0 CAD: P CVA: GP Sudden death < 50: 0 DM: P Mental Illness: 0  HTN: +  Social History: Reviewed history from 06/22/2008 and no changes required. Psychiatric nurse, [employer] Quit smoking 2002, about 7 pack years Alcohol use-yes Drug use-no Regular exercise-no  Review of Systems      See HPI  Physical Exam  General:  GEN: Well-developed,well-nourished,in no acute distress; alert,appropriate and cooperative throughout examination HEENT: Normocephalic and atraumatic without obvious abnormalities. No apparent alopecia or balding. Ears, externally no deformities PULM: Breathing comfortably in no respiratory distress EXT: No clubbing, cyanosis, or edema PSYCH: Normally interactive. Cooperative during the interview. Pleasant. Friendly and conversant. Not anxious or depressed appearing. Normal, full affect.    Impression & Recommendations:  Problem # 1:  SLEEP APNEA (ICD-780.57) sleep med consult at [facility] per request  Orders: Sleep Disorder Referral (Sleep Disorder)  Complete Medication List: 1)  Flonase 50 Mcg/act Susp (Fluticasone propionate) .... Two sprays each nostril daily 2)  Cyclobenzaprine Hcl 10 Mg Tabs (Cyclobenzaprine hcl) .... 1/2 - 1  by mouth 3 times daily as needed for back pain 3)  Sertraline Hcl 50 Mg Tabs (Sertraline hcl) .... 2 tabs by mouth daily  Patient Instructions: 1)  Referral Appointment Information 2)  Day/Date: 3)  Time: 4)  Place/MD: 5)  Address: 6)  Phone/Fax: 7)  Patient given appointment information. Information/Orders faxed/mailed.   Current Allergies (reviewed today): ! OMNICEF

## 2010-03-19 NOTE — Letter (Signed)
Summary: [facility]  [facility]   Imported By: Lanelle Bal 12/31/2009 11:25:23  _____________________________________________________________________  External Attachment:    Type:   Image     Comment:   External Document

## 2010-06-24 ENCOUNTER — Encounter: Payer: Self-pay | Admitting: Family Medicine

## 2010-06-24 ENCOUNTER — Ambulatory Visit (INDEPENDENT_AMBULATORY_CARE_PROVIDER_SITE_OTHER): Payer: Managed Care, Other (non HMO) | Admitting: Family Medicine

## 2010-06-24 ENCOUNTER — Ambulatory Visit (INDEPENDENT_AMBULATORY_CARE_PROVIDER_SITE_OTHER)
Admission: RE | Admit: 2010-06-24 | Discharge: 2010-06-24 | Disposition: A | Discharge status: Home / Self Care | Payer: Managed Care, Other (non HMO) | Source: Ambulatory Visit | Attending: Family Medicine | Admitting: Family Medicine

## 2010-06-24 VITALS — BP 122/80 | HR 84 | Temp 98.2°F | Wt 202.1 lb

## 2010-06-24 DIAGNOSIS — M25519 Pain in unspecified shoulder: Secondary | ICD-10-CM

## 2010-06-24 NOTE — Patient Instructions (Signed)
Wear the sling, take the voltaren with food and I'll send a note to Dr. Patsy Lager about getting you a follow up appointment.  Take care.
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
## 2010-06-24 NOTE — Assessment & Plan Note (Signed)
Likely AC strain/mild shoulder separation.  Most of his pain is on [facility] testing and I don't think this is a cuff syndrome.  Sling, voltaren, and we'll arrange fu with his PMD with sports med. D/w pt.  He understood.

## 2010-07-04 ENCOUNTER — Encounter: Payer: Self-pay | Admitting: Family Medicine

## 2010-07-04 ENCOUNTER — Ambulatory Visit (INDEPENDENT_AMBULATORY_CARE_PROVIDER_SITE_OTHER): Payer: Managed Care, Other (non HMO) | Admitting: Family Medicine

## 2010-07-04 VITALS — BP 110/80 | HR 81 | Temp 98.2°F | Ht 71.0 in | Wt 204.8 lb

## 2010-07-04 DIAGNOSIS — S43429A Sprain of unspecified rotator cuff capsule, initial encounter: Secondary | ICD-10-CM

## 2010-07-04 DIAGNOSIS — M25519 Pain in unspecified shoulder: Secondary | ICD-10-CM

## 2010-07-04 DIAGNOSIS — IMO0002 Reserved for concepts with insufficient information to code with codable children: Secondary | ICD-10-CM

## 2010-07-04 DIAGNOSIS — S43109A Unspecified dislocation of unspecified acromioclavicular joint, initial encounter: Secondary | ICD-10-CM
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
## 2010-07-10 ENCOUNTER — Other Ambulatory Visit: Payer: Self-pay | Admitting: Family Medicine

## 2010-09-25 ENCOUNTER — Other Ambulatory Visit: Payer: Self-pay | Admitting: Family Medicine

## 2010-10-09 ENCOUNTER — Other Ambulatory Visit: Payer: Self-pay | Admitting: *Deleted

## 2010-10-09 MED ORDER — CYCLOBENZAPRINE HCL 10 MG PO TABS: 10.0000 mg | ORAL_TABLET | Freq: Three times a day (TID) | ORAL | Status: DC | PRN

## 2010-10-09 NOTE — Telephone Encounter (Signed)
Ok to refill. 0 refills

## 2010-10-10 MED ORDER — CYCLOBENZAPRINE HCL 10 MG PO TABS: 10.0000 mg | ORAL_TABLET | Freq: Three times a day (TID) | ORAL | Status: DC | PRN

## 2010-10-10 NOTE — Telephone Encounter (Signed)
rx printed on and faxed to pharmacy

## 2010-11-25 ENCOUNTER — Other Ambulatory Visit: Payer: Self-pay | Admitting: *Deleted

## 2010-11-25 MED ORDER — FLUTICASONE PROPIONATE 50 MCG/ACT NA SUSP: 2.0000 | Freq: Every day | NASAL | Status: DC

## 2010-12-03 ENCOUNTER — Ambulatory Visit (INDEPENDENT_AMBULATORY_CARE_PROVIDER_SITE_OTHER): Payer: Managed Care, Other (non HMO) | Admitting: Family Medicine

## 2010-12-03 ENCOUNTER — Encounter: Payer: Self-pay | Admitting: Family Medicine

## 2010-12-03 VITALS — BP 130/80 | HR 81 | Temp 98.0°F | Ht 70.0 in | Wt 208.8 lb

## 2010-12-03 DIAGNOSIS — N62 Hypertrophy of breast: Secondary | ICD-10-CM

## 2010-12-03 LAB — TSH: TSH: 2.15 u[IU]/mL (ref 0.35–5.50)

## 2010-12-03 NOTE — Patient Instructions (Signed)
Alleve 2 tabs by mouth two tmes a day over the counter: Take at least for 2 - 3 weeks. This is equal to a prescripton strength dose (GENERIC CHEAPER EQUIVALENT IS NAPROXEN SODIUM)

## 2010-12-03 NOTE — Progress Notes (Signed)
  Subjective:    Patient ID: Kyle Mercer, male    DOB: 1970-05-26, 40 y.o.   MRN: [id number]  HPI  ZAIDEN LUDLUM, a 40 y.o. male presents today in the office for the following:    Very nice gentleman who has had pain in his left breast directly behind his nipple and in the caudal aspect of the area alone. This has never happened before. He has no family history of breast cancer that he can remember including no male breast cancer and no close relatives with that history. Has not had anything come from his nipple. No clear history of trauma.   2 months --- pain in his left breast and right behind the nipple. Thought coul dhave maybe hit it, and now can feel a little lump.   The PMH, PSH, Social History, Family History, Medications, and allergies have been reviewed in [facility], and have been updated if relevant.   Review of Systems ROS: GEN: No acute illnesses, no fevers, chills. GI: No n/v/d, eating normally Pulm: No SOB Interactive and getting along well at home.  Otherwise, ROS is as per the HPI.     Objective:   Physical Exam   Physical Exam  Blood pressure 130/80, pulse 81, temperature 98 F (36.7 C), temperature source Oral, height 5\' 10"  (1.778 m), weight 208 lb 12.8 oz (94.711 kg), SpO2 98.00%.  GEN: WDWN, NAD, Non-toxic, A & O x 3 HEENT: Atraumatic, Normocephalic. Neck supple. No masses, No LAD. Ears and Nose: No external deformity. CV: RRR, No M/G/R. No JVD. No thrill. No extra heart sounds. Breast: Right breast exam normal. Left breast with a nodule approximately the size of the centimeter that is in the caudal aspect of the areola PULM: CTA B, no wheezes, crackles, rhonchi. No retractions. No resp. distress. No accessory muscle use. EXTR: No c/c/e NEURO Normal gait.  PSYCH: Normally interactive. Conversant. Not depressed or anxious appearing.  Calm demeanor.        Assessment & Plan:   1. Gynecomastia  TSH, LH, Testosterone, free, total, Estradiol    Basic  endocrine workup. If this is all normal, nothing that we can follow it. NSAIDs for the next 2-3 weeks. If it enlarges at all, then a basic radiological evaluation seemed appropriate.

## 2010-12-04 ENCOUNTER — Ambulatory Visit: Payer: Managed Care, Other (non HMO)

## 2010-12-04 DIAGNOSIS — N62 Hypertrophy of breast: Secondary | ICD-10-CM

## 2010-12-04 LAB — TESTOSTERONE, FREE, TOTAL, SHBG
Sex Hormone Binding: 34 nmol/L (ref 13–71)
Testosterone, Free: 87.2 pg/mL (ref 47.0–244.0)
Testosterone-% Free: 2 % (ref 1.6–2.9)
Testosterone: 432.15 ng/dL (ref 250–890)

## 2010-12-04 LAB — ESTRADIOL: Estradiol: 21.6 pg/mL

## 2010-12-06 LAB — LUTEINIZING HORMONE: LH: 1.9 m[IU]/mL (ref 1.5–9.3)

## 2010-12-17 ENCOUNTER — Telehealth: Payer: Self-pay | Admitting: Family Medicine

## 2010-12-17 NOTE — Telephone Encounter (Signed)
Noted order placed

## 2010-12-17 NOTE — Telephone Encounter (Signed)
Patient called back yesterday to follow up from a week and half ago. Patient had lump in chest and verbalized that he understands the results back indicate no issues; however, he is still worried and would like to proceed with next step and requesting an ultrasound or imaging at [facility]. Patient call back is [phone].

## 2010-12-17 NOTE — Telephone Encounter (Signed)
Patient advised.

## 2010-12-17 NOTE — Progress Notes (Signed)
Addended by: Hannah Beat on: 12/17/2010 12:11 PM   Modules accepted: Orders

## 2010-12-18 ENCOUNTER — Telehealth: Payer: Self-pay

## 2010-12-18 DIAGNOSIS — N6320 Unspecified lump in the left breast, unspecified quadrant: Secondary | ICD-10-CM

## 2010-12-18 NOTE — Telephone Encounter (Signed)
Cara with [facility] [place] [facility] said they have order for [place] of left breast but also needs order for diagnostic bilateral mammogram. Charlynne Pander can be reached at [phone].

## 2010-12-18 NOTE — Telephone Encounter (Signed)
done

## 2011-01-02 ENCOUNTER — Other Ambulatory Visit: Payer: Managed Care, Other (non HMO)

## 2011-01-06 ENCOUNTER — Encounter: Payer: Self-pay | Admitting: *Deleted

## 2011-01-06 ENCOUNTER — Ambulatory Visit
Admission: RE | Admit: 2011-01-06 | Discharge: 2011-01-06 | Disposition: A | Discharge status: Home / Self Care | Payer: Managed Care, Other (non HMO) | Source: Ambulatory Visit | Attending: Family Medicine | Admitting: Family Medicine

## 2011-01-06 DIAGNOSIS — N62 Hypertrophy of breast: Secondary | ICD-10-CM

## 2011-01-06 DIAGNOSIS — N6320 Unspecified lump in the left breast, unspecified quadrant: Secondary | ICD-10-CM

## 2011-04-07 ENCOUNTER — Other Ambulatory Visit: Payer: Self-pay | Admitting: Family Medicine

## 2011-04-26 ENCOUNTER — Ambulatory Visit: Payer: Self-pay | Admitting: Internal Medicine

## 2011-09-15 ENCOUNTER — Encounter: Payer: Self-pay | Admitting: Family Medicine

## 2011-09-15 ENCOUNTER — Ambulatory Visit (INDEPENDENT_AMBULATORY_CARE_PROVIDER_SITE_OTHER): Payer: Managed Care, Other (non HMO) | Admitting: Family Medicine

## 2011-09-15 VITALS — BP 118/78 | HR 92 | Temp 99.7°F | Wt 214.8 lb

## 2011-09-15 DIAGNOSIS — J111 Influenza due to unidentified influenza virus with other respiratory manifestations: Secondary | ICD-10-CM

## 2011-09-15 DIAGNOSIS — R6889 Other general symptoms and signs: Secondary | ICD-10-CM

## 2011-09-15 MED ORDER — HYDROCODONE-HOMATROPINE 5-1.5 MG/5ML PO SYRP: ORAL_SOLUTION | ORAL | Status: AC

## 2011-09-15 NOTE — Progress Notes (Signed)
   Nature conservation officer at [facility] [address] [place] [address] Phone: [phone] Fax: [phone]  Date:  09/15/2011   Name:  Kyle Mercer   DOB:  Aug 12, 1970   MRN:  [id number]  PCP:  Hannah Beat, MD    Chief Complaint: Cough   History of Present Illness:  Kyle Mercer is a 41 y.o. very pleasant male patient who presents with the following:  Woke up and temp was 102.1  Ibuprofen and robitussin. No diarrhea, no vomitting.  Hurts with breathing. About Thursday was feling weird and like scalded a little bit. Feels kind of weird. Taste buds are gone. Some headache.   Achy and hurting all over.  MAXIMUM TEMPERATURE is 102.1 Fahrenheit.  Is significant coughing, no rashes. No nausea vomiting or diarrhea. No recent tick bites. He did have a recent travel on a cruise ship that that he reasonably and recently got back  Past Medical History, Surgical History, Social History, Family History, Problem List, Medications, and Allergies have been reviewed and updated if relevant.  Current Outpatient Prescriptions on File Prior to Visit  Medication Sig Dispense Refill  . cyclobenzaprine (FLEXERIL) 10 MG tablet Take 1 tablet (10 mg total) by mouth 3 (three) times daily as needed for muscle spasms. Take 1/2- 1 by mouth 3 times daily as needed for back pain  40 tablet  0  . fluticasone (FLONASE) 50 MCG/ACT nasal spray PLACE 2 SPRAYS INTO THE NOSE DAILY.  48 g  3  . NON FORMULARY C-PAP machine, use at night       . DISCONTD: sertraline (ZOLOFT) 50 MG tablet TAKE 2 TABLETS BY MOUTH DAILY  60 tablet  3    Review of Systems: ROS: GEN: Acute illness details above GI: Tolerating PO intake GU: maintaining adequate hydration and urination Pulm: No SOB Interactive and getting along well at home.  Otherwise, ROS is as per the HPI.   Physical Examination: Filed Vitals:   09/15/11 1024  BP: 118/78  Pulse: 92  Temp: 99.7 F (37.6 C)   Filed Vitals:   09/15/11 1024    Weight: 214 lb 12 oz (97.41 kg)   There is no height on file to calculate BMI. Ideal Body Weight:     Gen: WDWN, NAD; A & O x3, cooperative. Pleasant.Globally Non-toxic HEENT: Normocephalic and atraumatic. Throat clear, w/o exudate, R TM clear, L TM - good landmarks, No fluid present. rhinnorhea. No frontal or maxillary sinus T. MMM NECK: Anterior cervical  LAD is present CV: RRR, No M/G/R, cap refill <2 sec PULM: Breathing comfortably in no respiratory distress. no wheezing, crackles, rhonchi ABD: S,NT,ND,+BS. No HSM. No rebound. EXT: No c/c/e PSYCH: Friendly, good eye contact MSK: Nml gait   Assessment and Plan: 1. Flu-like symptoms     Flulike illness. Supportive care. Hycodan when necessary at night. Continue with dextrohydromorphan or Robitussin during the day.  Hannah Beat, MD

## 2011-12-05 ENCOUNTER — Other Ambulatory Visit: Payer: Self-pay | Admitting: Family Medicine

## 2011-12-08 NOTE — Telephone Encounter (Signed)
Received refill request electronically. Request does not match med sheet. Is it okay to refill as requested?

## 2011-12-08 NOTE — Telephone Encounter (Signed)
This appears to be dose per chart. Reviewed.   Hannah Beat, MD 12/08/2011, 6:13 PM

## 2012-03-16 ENCOUNTER — Telehealth: Payer: Self-pay | Admitting: Family Medicine

## 2012-03-16 NOTE — Telephone Encounter (Signed)
Pt requesting diamox rx for altitude sickness for future skiing trip in [place].  He is leaving 03/25/12 and would like to start taking rx a few days before his trip.

## 2012-03-17 MED ORDER — ACETAZOLAMIDE 125 MG PO TABS: 125.0000 mg | ORAL_TABLET | Freq: Two times a day (BID) | ORAL | Status: DC

## 2012-03-17 NOTE — Telephone Encounter (Signed)
Done--let him know

## 2012-03-17 NOTE — Telephone Encounter (Signed)
Patient advised.

## 2012-04-10 ENCOUNTER — Other Ambulatory Visit: Payer: Self-pay | Admitting: Family Medicine

## 2012-04-12 NOTE — Telephone Encounter (Signed)
Not seen for cpx in over 1 year

## 2012-04-12 NOTE — Telephone Encounter (Signed)
Refilled, but set up 30 min cpx

## 2012-04-12 NOTE — Telephone Encounter (Signed)
Patient advised and appt schduled

## 2012-04-26 ENCOUNTER — Encounter: Payer: Managed Care, Other (non HMO) | Admitting: Family Medicine

## 2012-08-23 ENCOUNTER — Encounter: Payer: Self-pay | Admitting: Family Medicine

## 2012-08-23 ENCOUNTER — Ambulatory Visit (INDEPENDENT_AMBULATORY_CARE_PROVIDER_SITE_OTHER): Payer: Managed Care, Other (non HMO) | Admitting: Family Medicine

## 2012-08-23 VITALS — BP 130/90 | HR 92 | Temp 98.7°F | Ht 70.0 in | Wt 213.5 lb

## 2012-08-23 DIAGNOSIS — B349 Viral infection, unspecified: Secondary | ICD-10-CM

## 2012-08-23 DIAGNOSIS — H109 Unspecified conjunctivitis: Secondary | ICD-10-CM

## 2012-08-23 DIAGNOSIS — J029 Acute pharyngitis, unspecified: Secondary | ICD-10-CM

## 2012-08-23 DIAGNOSIS — B9789 Other viral agents as the cause of diseases classified elsewhere: Secondary | ICD-10-CM

## 2012-08-23 LAB — POCT RAPID STREP A (OFFICE): Rapid Strep A Screen: NEGATIVE

## 2012-08-23 MED ORDER — HYDROCODONE-HOMATROPINE 5-1.5 MG/5ML PO SYRP: ORAL_SOLUTION | ORAL | Status: DC

## 2012-08-23 MED ORDER — POLYMYXIN B-TRIMETHOPRIM 10000-0.1 UNIT/ML-% OP SOLN: 1.0000 [drp] | OPHTHALMIC | Status: DC

## 2012-08-23 NOTE — Progress Notes (Signed)
[facility] at [facility] [address] [place] [address] Phone: [phone] Fax: [phone]  Date:  08/23/2012   Name:  Kyle Mercer   DOB:  [birth year]   MRN:  [id number] Gender: male Age: 42 y.o.  Primary Physician:  Hannah Beat, MD  Evaluating MD: Hannah Beat, MD   Chief Complaint: Fever, Sore Throat and Nasal Congestion   History of Present Illness:  Kyle Mercer is a 42 y.o. pleasant patient who presents with the following:  Fever Saturday monring, and kept her granddaughter. GD has had fever, and will sometimes be burning up and then freezing to death. Most of head, shoulders, eyes are all blood shot and crusty. Drainage and throat sore. Was using some nyquil and advil in the morning. He generally is feeling terrible, and this morning his eyes have a calm quite reddened and crusty.  He feels very poorly overall and has had a temperature max of approximately 102.  B conj.    Patient Active Problem List   Diagnosis Date Noted  . SLEEP APNEA 06/25/2009  . OTHER MALAISE AND FATIGUE 06/28/2008  . ANXIETY 06/25/2008  . DEPRESSION 06/25/2008  . ALLERGIC RHINITIS 06/25/2008  . GERD 06/25/2008  . NEPHROLITHIASIS, HX OF 06/25/2008    Past Medical History  Diagnosis Date  . Allergic rhinitis   . Anxiety   . Depression   . GERD (gastroesophageal reflux disease)   . Headache(784.0)   . History of nephrolithiasis   . Chronic neck pain   . Chronic back pain     No past surgical history on file.  History   Social History  . Marital Status: Married    Spouse Name: N/A    Number of Children: N/A  . Years of Education: N/A   Occupational History  . SOFTWARE DEVELOPER     [employer]   Social History Main Topics  . Smoking status: Former Smoker -- 7 years    Quit date: 02/18/2000  . Smokeless tobacco: Former Neurosurgeon  . Alcohol Use: Yes  . Drug Use: No  . Sexually Active: Not on file   Other Topics Concern  . Not on  file   Social History Narrative   Regular exercise- no    Family History  Problem Relation Age of Onset  . Heart disease Other     CAD    Allergies  Allergen Reactions  . Cefdinir     REACTION: cramping in stomach, vomiting and diarrhea    Medication list has been reviewed and updated.  Outpatient Prescriptions Prior to Visit  Medication Sig Dispense Refill  . acetaZOLAMIDE (DIAMOX) 125 MG tablet Take 1 tablet (125 mg total) by mouth 2 (two) times daily. Start 1 day before altitude gain  60 tablet  0  . cyclobenzaprine (FLEXERIL) 10 MG tablet Take 1 tablet (10 mg total) by mouth 3 (three) times daily as needed for muscle spasms. Take 1/2- 1 by mouth 3 times daily as needed for back pain  40 tablet  0  . fluticasone (FLONASE) 50 MCG/ACT nasal spray PLACE 2 SPRAYS INTO THE NOSE DAILY.  16 g  2  . NON FORMULARY C-PAP machine, use at night       . sertraline (ZOLOFT) 50 MG tablet TAKE 2 TABLETS BY MOUTH DAILY  60 tablet  3  . dextromethorphan 15 MG/5ML syrup Take 10 mLs by mouth 4 (four) times daily as needed.      Marland Kitchen guaifenesin (HUMIBID E)  400 MG TABS Take 400 mg by mouth as needed.      Marland Kitchen ibuprofen (ADVIL,MOTRIN) 200 MG tablet Take 200 mg by mouth as needed.      . sertraline (ZOLOFT) 50 MG tablet        No facility-administered medications prior to visit.    Review of Systems:  ROS: GEN: Acute illness details above GI: Tolerating PO intake GU: maintaining adequate hydration and urination Pulm: No SOB Interactive and getting along well at home.  Otherwise, ROS is as per the HPI.   Physical Examination: BP 130/90  Pulse 92  Temp(Src) 98.7 F (37.1 C) (Oral)  Ht 5\' 10"  (1.778 m)  Wt 213 lb 8 oz (96.843 kg)  BMI 30.63 kg/m2  SpO2 97%  Ideal Body Weight: Weight in (lb) to have BMI = 25: 173.9   Gen: WDWN, NAD; A & O x3, cooperative. Pleasant.Globally Non-toxic HEENT: Normocephalic and atraumatic. Eyes are very reddened and have some evidence of crusting. Throat  clear, w/o exudate, R TM clear, L TM - good landmarks, No fluid present. rhinnorhea. No frontal or maxillary sinus T. MMM NECK: Anterior cervical  LAD is present CV: RRR, No M/G/R, cap refill <2 sec PULM: Breathing comfortably in no respiratory distress. no wheezing, crackles, rhonchi ABD: S,NT,ND,+BS. No HSM. No rebound. EXT: No c/c/e PSYCH: Friendly, good eye contact MSK: Nml gait   Assessment and Plan:  Viral syndrome  Sore throat - Plan: POCT rapid strep A  Conjunctivitis unspecified  Most likely viral syndrome with associated conjunctivitis. We will go ahead and treat the patient's conjunctivitis given how severe it looks. He is going to call if it continues to worsen over the next 2 or 3 days.    Results for orders placed in visit on 08/23/12  POCT RAPID STREP A (OFFICE)      Result Value Range   Rapid Strep A Screen Negative  Negative     Orders Today:  Orders Placed This Encounter  Procedures  . POCT rapid strep A    Updated Medication List: (Includes new medications, updates to list, dose adjustments) Meds ordered this encounter  Medications  . HYDROcodone-homatropine (HYCODAN) 5-1.5 MG/5ML syrup    Sig: 1 tsp po at night before bed prn cough    Dispense:  240 mL    Refill:  0  . trimethoprim-polymyxin b (POLYTRIM) ophthalmic solution    Sig: Place 1 drop into both eyes every 4 (four) hours.    Dispense:  10 mL    Refill:  0    Medications Discontinued: Medications Discontinued During This Encounter  Medication Reason  . dextromethorphan 15 MG/5ML syrup Error  . guaifenesin (HUMIBID E) 400 MG TABS Error  . ibuprofen (ADVIL,MOTRIN) 200 MG tablet Error  . sertraline (ZOLOFT) 50 MG tablet Error      Signed, Monish Haliburton T. Donyea Beverlin, MD 08/23/2012 11:16 AM

## 2012-10-23 ENCOUNTER — Other Ambulatory Visit: Payer: Self-pay | Admitting: Family Medicine

## 2012-11-17 ENCOUNTER — Telehealth: Payer: Self-pay | Admitting: Family Medicine

## 2012-11-17 NOTE — Telephone Encounter (Signed)
Yes, 30 min

## 2012-11-17 NOTE — Telephone Encounter (Signed)
Patient wants to know if he can come in sooner for his physical.  Your first available is 01/10/13.  Patient needs the physical before 12/01/12 for his insurance.  Can patient be seen before 12/01/12?

## 2012-11-18 ENCOUNTER — Other Ambulatory Visit (INDEPENDENT_AMBULATORY_CARE_PROVIDER_SITE_OTHER): Payer: Managed Care, Other (non HMO)

## 2012-11-18 DIAGNOSIS — Z13220 Encounter for screening for lipoid disorders: Secondary | ICD-10-CM

## 2012-11-18 DIAGNOSIS — R5381 Other malaise: Secondary | ICD-10-CM

## 2012-11-18 LAB — HEPATIC FUNCTION PANEL
ALT: 52 U/L (ref 0–53)
AST: 37 U/L (ref 0–37)
Albumin: 4.4 g/dL (ref 3.5–5.2)
Alkaline Phosphatase: 39 U/L (ref 39–117)
Bilirubin, Direct: 0.1 mg/dL (ref 0.0–0.3)
Total Bilirubin: 0.9 mg/dL (ref 0.3–1.2)
Total Protein: 7.3 g/dL (ref 6.0–8.3)

## 2012-11-18 LAB — CBC WITH DIFFERENTIAL/PLATELET
Basophils Absolute: 0 10*3/uL (ref 0.0–0.1)
Basophils Relative: 0.7 % (ref 0.0–3.0)
Eosinophils Absolute: 0.1 10*3/uL (ref 0.0–0.7)
Eosinophils Relative: 2.3 % (ref 0.0–5.0)
HCT: 42.7 % (ref 39.0–52.0)
Hemoglobin: 14.9 g/dL (ref 13.0–17.0)
Lymphocytes Relative: 33.2 % (ref 12.0–46.0)
Lymphs Abs: 1.6 10*3/uL (ref 0.7–4.0)
MCHC: 35 g/dL (ref 30.0–36.0)
MCV: 91.4 fl (ref 78.0–100.0)
Monocytes Absolute: 0.4 10*3/uL (ref 0.1–1.0)
Monocytes Relative: 8.8 % (ref 3.0–12.0)
Neutro Abs: 2.7 10*3/uL (ref 1.4–7.7)
Neutrophils Relative %: 55 % (ref 43.0–77.0)
Platelets: 205 10*3/uL (ref 150.0–400.0)
RBC: 4.67 Mil/uL (ref 4.22–5.81)
RDW: 12.4 % (ref 11.5–14.6)
WBC: 4.9 10*3/uL (ref 4.5–10.5)

## 2012-11-18 LAB — BASIC METABOLIC PANEL
BUN: 16 mg/dL (ref 6–23)
CO2: 26 mEq/L (ref 19–32)
Calcium: 9.5 mg/dL (ref 8.4–10.5)
Chloride: 102 mEq/L (ref 96–112)
Creatinine, Ser: 1.1 mg/dL (ref 0.4–1.5)
GFR: 77.84 mL/min (ref >60.00)
Glucose, Bld: 93 mg/dL (ref 70–99)
Potassium: 4.2 mEq/L (ref 3.5–5.1)
Sodium: 135 mEq/L (ref 135–145)

## 2012-11-18 LAB — LIPID PANEL
Cholesterol: 212 mg/dL — ABNORMAL HIGH (ref 0–200)
HDL: 40.5 mg/dL (ref >39.00)
Total CHOL/HDL Ratio: 5
Triglycerides: 96 mg/dL (ref 0.0–149.0)
VLDL: 19.2 mg/dL (ref 0.0–40.0)

## 2012-11-18 LAB — LDL CHOLESTEROL, DIRECT: Direct LDL: 163.7 mg/dL

## 2012-11-22 ENCOUNTER — Encounter: Payer: Self-pay | Admitting: Family Medicine

## 2012-11-22 ENCOUNTER — Ambulatory Visit (INDEPENDENT_AMBULATORY_CARE_PROVIDER_SITE_OTHER): Payer: Managed Care, Other (non HMO) | Admitting: Family Medicine

## 2012-11-22 VITALS — BP 120/80 | HR 69 | Temp 98.2°F | Ht 70.0 in | Wt 201.8 lb

## 2012-11-22 DIAGNOSIS — Z23 Encounter for immunization: Secondary | ICD-10-CM

## 2012-11-22 DIAGNOSIS — Z0000 Encounter for general adult medical examination without abnormal findings: Secondary | ICD-10-CM

## 2012-11-22 NOTE — Progress Notes (Signed)
[facility] at [facility] [address] [place] [address] Phone: [phone] Fax: [phone]  Date:  11/22/2012   Name:  Kyle Mercer   DOB:  06-17-1970   MRN:  [id number] Gender: male Age: 42 y.o.  Primary Physician:  Hannah Beat, MD   Chief Complaint: Annual Exam   History of Present Illness:  HANZ WINTERHALTER is a 42 y.o. pleasant patient who presents with the following:  CPX:  Doing a weight loss challenge for work and has dropped about 14 pounds and exercising three days a week.   Preventative Health Maintenance Visit:  Health Maintenance Summary Reviewed and updated, unless pt declines services.  Tobacco History Reviewed. Alcohol: No concerns, no excessive use Exercise Habits: Some activity, rec at least 30 mins 5 times a week STD concerns: no risk or activity to increase risk Drug Use: None Encouraged self-testicular check  Health Maintenance  Topic Date Due  . Influenza Vaccine  09/17/2013  . Tetanus/tdap  11/23/2022    Labs reviewed with the patient.  Results for orders placed in visit on 11/18/12  LIPID PANEL      Result Value Range   Cholesterol 212 (*) 0 - 200 mg/dL   Triglycerides 86.5  0.0 - 149.0 mg/dL   HDL 78.46  >96.29 mg/dL   VLDL 52.8  0.0 - 41.3 mg/dL   Total CHOL/HDL Ratio 5    HEPATIC FUNCTION PANEL      Result Value Range   Total Bilirubin 0.9  0.3 - 1.2 mg/dL   Bilirubin, Direct 0.1  0.0 - 0.3 mg/dL   Alkaline Phosphatase 39  39 - 117 U/L   AST 37  0 - 37 U/L   ALT 52  0 - 53 U/L   Total Protein 7.3  6.0 - 8.3 g/dL   Albumin 4.4  3.5 - 5.2 g/dL  BASIC METABOLIC PANEL      Result Value Range   Sodium 135  135 - 145 mEq/L   Potassium 4.2  3.5 - 5.1 mEq/L   Chloride 102  96 - 112 mEq/L   CO2 26  19 - 32 mEq/L   Glucose, Bld 93  70 - 99 mg/dL   BUN 16  6 - 23 mg/dL   Creatinine, Ser 1.1  0.4 - 1.5 mg/dL   Calcium 9.5  8.4 - 24.4 mg/dL   GFR 01.02  >72.53 mL/min  CBC WITH DIFFERENTIAL      Result Value  Range   WBC 4.9  4.5 - 10.5 K/uL   RBC 4.67  4.22 - 5.81 Mil/uL   Hemoglobin 14.9  13.0 - 17.0 g/dL   HCT 66.4  40.3 - 47.4 %   MCV 91.4  78.0 - 100.0 fl   MCHC 35.0  30.0 - 36.0 g/dL   RDW 25.9  56.3 - 87.5 %   Platelets 205.0  150.0 - 400.0 K/uL   Neutrophils Relative % 55.0  43.0 - 77.0 %   Lymphocytes Relative 33.2  12.0 - 46.0 %   Monocytes Relative 8.8  3.0 - 12.0 %   Eosinophils Relative 2.3  0.0 - 5.0 %   Basophils Relative 0.7  0.0 - 3.0 %   Neutro Abs 2.7  1.4 - 7.7 K/uL   Lymphs Abs 1.6  0.7 - 4.0 K/uL   Monocytes Absolute 0.4  0.1 - 1.0 K/uL   Eosinophils Absolute 0.1  0.0 - 0.7 K/uL   Basophils Absolute 0.0  0.0 - 0.1  K/uL  LDL CHOLESTEROL, DIRECT      Result Value Range   Direct LDL 163.7       Patient Active Problem List   Diagnosis Date Noted  . SLEEP APNEA 06/25/2009  . OTHER MALAISE AND FATIGUE 06/28/2008  . ANXIETY 06/25/2008  . DEPRESSION 06/25/2008  . ALLERGIC RHINITIS 06/25/2008  . GERD 06/25/2008  . NEPHROLITHIASIS, HX OF 06/25/2008    Past Medical History  Diagnosis Date  . Allergic rhinitis   . Anxiety   . Depression   . GERD (gastroesophageal reflux disease)   . Headache(784.0)   . History of nephrolithiasis   . Chronic neck pain   . Chronic back pain     No past surgical history on file.  History   Social History  . Marital Status: Married    Spouse Name: N/A    Number of Children: N/A  . Years of Education: N/A   Occupational History  . SOFTWARE DEVELOPER     [employer]   Social History Main Topics  . Smoking status: Former Smoker -- 7 years    Quit date: 02/18/2000  . Smokeless tobacco: Former Neurosurgeon  . Alcohol Use: Yes  . Drug Use: No  . Sexual Activity: Not on file   Other Topics Concern  . Not on file   Social History Narrative   Regular exercise- no    Family History  Problem Relation Age of Onset  . Heart disease Other     CAD    Allergies  Allergen Reactions  . Cefdinir     REACTION:  cramping in stomach, vomiting and diarrhea    Medication list has been reviewed and updated.  Outpatient Prescriptions Prior to Visit  Medication Sig Dispense Refill  . acetaZOLAMIDE (DIAMOX) 125 MG tablet Take 1 tablet (125 mg total) by mouth 2 (two) times daily. Start 1 day before altitude gain  60 tablet  0  . cyclobenzaprine (FLEXERIL) 10 MG tablet Take 1 tablet (10 mg total) by mouth 3 (three) times daily as needed for muscle spasms. Take 1/2- 1 by mouth 3 times daily as needed for back pain  40 tablet  0  . fluticasone (FLONASE) 50 MCG/ACT nasal spray PLACE 2 SPRAYS INTO THE NOSE ONCE DAILY.  16 g  11  . NON FORMULARY C-PAP machine, use at night       . sertraline (ZOLOFT) 50 MG tablet TAKE 2 TABLETS BY MOUTH DAILY  60 tablet  3  . HYDROcodone-homatropine (HYCODAN) 5-1.5 MG/5ML syrup 1 tsp po at night before bed prn cough  240 mL  0  . trimethoprim-polymyxin b (POLYTRIM) ophthalmic solution Place 1 drop into both eyes every 4 (four) hours.  10 mL  0   No facility-administered medications prior to visit.    Review of Systems:   General: Denies fever, chills, sweats. No significant weight loss. Eyes: Denies blurring,significant itching ENT: Denies earache, sore throat, and hoarseness. Cardiovascular: Denies chest pains, palpitations, dyspnea on exertion Respiratory: Denies cough, dyspnea at rest,wheeezing Breast: no concerns about lumps GI: Denies nausea, vomiting, diarrhea, constipation, change in bowel habits, abdominal pain, melena, hematochezia GU: Denies penile discharge, ED, urinary flow / outflow problems. No STD concerns. Musculoskeletal: Denies back pain, joint pain Derm: Denies rash, itching Neuro: Denies  paresthesias, frequent falls, frequent headaches Psych: Denies depression, anxiety Endocrine: Denies cold intolerance, heat intolerance, polydipsia Heme: Denies enlarged lymph nodes Allergy: No hayfever  Physical Examination: BP 120/80  Pulse 69  Temp(Src) 98.2  F (36.8 C) (Oral)  Ht 5\' 10"  (1.778 m)  Wt 201 lb 12 oz (91.513 kg)  BMI 28.95 kg/m2  Ideal Body Weight: Weight in (lb) to have BMI = 25: 173.9   Wt Readings from Last 3 Encounters:  11/22/12 201 lb 12 oz (91.513 kg)  08/23/12 213 lb 8 oz (96.843 kg)  09/15/11 214 lb 12 oz (97.41 kg)    GEN: well developed, well nourished, no acute distress Eyes: conjunctiva and lids normal, PERRLA, EOMI ENT: TM clear, nares clear, oral exam WNL Neck: supple, no lymphadenopathy, no thyromegaly, no JVD Pulm: clear to auscultation and percussion, respiratory effort normal CV: regular rate and rhythm, S1-S2, no murmur, rub or gallop, no bruits, peripheral pulses normal and symmetric, no cyanosis, clubbing, edema or varicosities Chest: no scars, masses GI: soft, non-tender; no hepatosplenomegaly, masses; active bowel sounds all quadrants GU: no hernia, testicular mass, penile discharge, or prostate enlargement Lymph: no cervical, axillary or inguinal adenopathy MSK: gait normal, muscle tone and strength WNL, no joint swelling, effusions, discoloration, crepitus  SKIN: clear, good turgor, color WNL, no rashes, lesions, or ulcerations Neuro: normal mental status, normal strength, sensation, and motion Psych: alert; oriented to person, place and time, normally interactive and not anxious or depressed in appearance.  Assessment and Plan:  Routine general medical examination at a health care facility  Need for prophylactic vaccination and inoculation against influenza - Plan: Flu Vaccine QUAD 36+ mos PF IM (Fluarix)  Need for prophylactic vaccination with combined diphtheria-tetanus-pertussis (DTP) vaccine - Plan: Tdap vaccine greater than or equal to 7yo IM  The patient's preventative maintenance and recommended screening tests for an annual wellness exam were reviewed in full today. Brought up to date unless services declined.  Counselled on the importance of diet, exercise, and its role in overall  health and mortality. The patient's FH and SH was reviewed, including their home life, tobacco status, and drug and alcohol status.   Doing well  Orders Today:  Orders Placed This Encounter  Procedures  . Flu Vaccine QUAD 36+ mos PF IM (Fluarix)  . Tdap vaccine greater than or equal to 7yo IM    Updated Medication List: (Includes new medications, updates to list, dose adjustments) No orders of the defined types were placed in this encounter.    Medications Discontinued: Medications Discontinued During This Encounter  Medication Reason  . HYDROcodone-homatropine (HYCODAN) 5-1.5 MG/5ML syrup Completed Course  . trimethoprim-polymyxin b (POLYTRIM) ophthalmic solution Completed Course      Signed,  Karleen Hampshire T. Roc Streett, MD

## 2013-03-06 ENCOUNTER — Other Ambulatory Visit: Payer: Self-pay | Admitting: Family Medicine

## 2013-03-06 NOTE — Telephone Encounter (Signed)
Last office visit 11/22/2012. Ok to refill?

## 2013-12-02 ENCOUNTER — Other Ambulatory Visit: Payer: Self-pay

## 2013-12-04 ENCOUNTER — Other Ambulatory Visit: Payer: Self-pay | Admitting: Family Medicine

## 2014-02-07 ENCOUNTER — Other Ambulatory Visit: Payer: Self-pay | Admitting: Family Medicine

## 2014-03-20 ENCOUNTER — Other Ambulatory Visit: Payer: Self-pay | Admitting: Family Medicine

## 2014-03-21 NOTE — Telephone Encounter (Signed)
Last office visit 11/22/2012.  Not on current medication list.  Refill?

## 2014-03-21 NOTE — Telephone Encounter (Signed)
He has not been on this in >> 1 year. Can you see what is going on with him?

## 2014-03-21 NOTE — Telephone Encounter (Signed)
Pt left v/m returning call and pt requesting cb from [facility] about zoloft not on pts med list.

## 2014-03-21 NOTE — Telephone Encounter (Signed)
Spoke with [employer].  He states nothing is going on. He states this is just for maintenance.   He has been taking this medication for 5 years since Dr. Lorelei Pont started him on it.   Ok to refill?

## 2014-03-21 NOTE — Telephone Encounter (Signed)
Left message for Mr. Schrader to call the office and schedule office visit with Dr. Lorelei Pont.

## 2014-04-09 ENCOUNTER — Other Ambulatory Visit: Payer: Self-pay | Admitting: Family Medicine

## 2014-04-09 NOTE — Telephone Encounter (Signed)
Last office visit 11/22/2012.  Ok to refill?

## 2014-04-17 ENCOUNTER — Other Ambulatory Visit: Payer: Self-pay | Admitting: Family Medicine

## 2014-04-17 NOTE — Telephone Encounter (Signed)
Ok to refill 30, 2 refills

## 2014-04-17 NOTE — Telephone Encounter (Signed)
Last office visit 11/22/2012.  Last refilled 03/06/2013 for #30 with no refills.  Ok to refill?

## 2015-03-28 ENCOUNTER — Other Ambulatory Visit: Payer: Self-pay | Admitting: Family Medicine

## 2015-04-01 ENCOUNTER — Other Ambulatory Visit: Payer: Self-pay | Admitting: Family Medicine

## 2015-04-05 ENCOUNTER — Other Ambulatory Visit: Payer: Self-pay

## 2015-04-05 MED ORDER — FLUTICASONE PROPIONATE 50 MCG/ACT NA SUSP: NASAL | Status: DC

## 2015-04-05 NOTE — Telephone Encounter (Signed)
Pt left v/m requesting refill flonase to [facility]; pt has scheduled appt 04/23/15. Notified pt refill done until seen by v/m per DPR.last annual exam 11/22/2012.

## 2015-04-23 ENCOUNTER — Ambulatory Visit (INDEPENDENT_AMBULATORY_CARE_PROVIDER_SITE_OTHER): Payer: Managed Care, Other (non HMO) | Admitting: Family Medicine

## 2015-04-23 ENCOUNTER — Encounter: Payer: Self-pay | Admitting: Family Medicine

## 2015-04-23 VITALS — BP 120/84 | HR 91 | Temp 98.5°F | Ht 69.75 in | Wt 217.8 lb

## 2015-04-23 DIAGNOSIS — F411 Generalized anxiety disorder: Secondary | ICD-10-CM | POA: Diagnosis not present

## 2015-04-23 DIAGNOSIS — J301 Allergic rhinitis due to pollen: Secondary | ICD-10-CM

## 2015-04-23 MED ORDER — FLUTICASONE PROPIONATE 50 MCG/ACT NA SUSP: NASAL | Status: DC

## 2015-04-23 MED ORDER — SERTRALINE HCL 50 MG PO TABS: 100.0000 mg | ORAL_TABLET | Freq: Every day | ORAL | Status: DC

## 2015-04-23 NOTE — Progress Notes (Addendum)
Dr. Frederico Hamman T. Christyan Reger, MD, [facility] [place], [address] Phone: [phone] Fax: [phone]  04/23/2015  Patient: Kyle Mercer, MRN: [id number], DOB: 15-Mar-1970, 45 y.o.  Primary Physician:  Owens Loffler, MD   Chief Complaint  Patient presents with  . Medication Refill   Subjective:   Kyle Mercer is a 45 y.o. very pleasant male patient who presents with the following:  Tore R RTC, end of March, surgery. [facility].   Med refill  Refill SSRI and flonase for allergies, increased recently  Past Medical History, Surgical History, Social History, Family History, Problem List, Medications, and Allergies have been reviewed and updated if relevant.  Patient Active Problem List   Diagnosis Date Noted  . SLEEP APNEA 06/25/2009  . OTHER MALAISE AND FATIGUE 06/28/2008  . ANXIETY 06/25/2008  . DEPRESSION 06/25/2008  . ALLERGIC RHINITIS 06/25/2008  . GERD 06/25/2008  . NEPHROLITHIASIS, HX OF 06/25/2008    Past Medical History  Diagnosis Date  . Allergic rhinitis   . Anxiety   . Depression   . GERD (gastroesophageal reflux disease)   . Headache(784.0)   . History of nephrolithiasis   . Chronic neck pain   . Chronic back pain     No past surgical history on file.  Social History   Social History  . Marital Status: Married    Spouse Name: N/A  . Number of Children: N/A  . Years of Education: N/A   Occupational History  . [employer]   Social History Main Topics  . Smoking status: Former Smoker -- 7 years    Quit date: 02/18/2000  . Smokeless tobacco: Former Systems developer  . Alcohol Use: Yes  . Drug Use: No  . Sexual Activity: Not on file   Other Topics Concern  . Not on file   Social History Narrative   Regular exercise- no    Family History  Problem Relation Age of Onset  . Heart disease Other     CAD    Allergies  Allergen Reactions  . Cefdinir      REACTION: cramping in stomach, vomiting and diarrhea    Medication list reviewed and updated in full in [place].  GEN: No fevers, chills. Nontoxic. Primarily MSK c/o today. MSK: Detailed in the HPI GI: tolerating PO intake without difficulty Neuro: No numbness, parasthesias, or tingling associated. Otherwise the pertinent positives of the ROS are noted above.   Objective:   BP 120/84 mmHg  Pulse 91  Temp(Src) 98.5 F (36.9 C) (Oral)  Ht 5' 9.75" (1.772 m)  Wt 217 lb 12 oz (98.771 kg)  BMI 31.46 kg/m2   GEN: WDWN, NAD, Non-toxic, A & O x 3 HEENT: Atraumatic, Normocephalic. Neck supple. No masses, No LAD. Ears and Nose: No external deformity. CV: RRR, No M/G/R. No JVD. No thrill. No extra heart sounds. PULM: CTA B, no wheezes, crackles, rhonchi. No retractions. No resp. distress. No accessory muscle use. EXTR: No c/c/e NEURO Normal gait.  PSYCH: Normally interactive. Conversant. Not depressed or anxious appearing.  Calm demeanor.    Radiology: No results found.  Assessment and Plan:    Allergic rhinitis due to pollen  Anxiety state  stable  Follow-up: prn  New Prescriptions   No medications on file   Modified Medications   Modified Medication Previous Medication   FLUTICASONE (FLONASE) 50 MCG/ACT NASAL SPRAY fluticasone (FLONASE) 50 MCG/ACT  nasal spray      PLACE 2 SPRAYS INTO THE NOSE ONCE DAILY.    PLACE 2 SPRAYS INTO THE NOSE ONCE DAILY.   SERTRALINE (ZOLOFT) 50 MG TABLET sertraline (ZOLOFT) 50 MG tablet      Take 2 tablets (100 mg total) by mouth daily.    TAKE 2 TABLETS BY MOUTH DAILY   No orders of the defined types were placed in this encounter.    Signed,  Maud Deed. Grove Defina, MD   Patient's Medications  New Prescriptions   No medications on file  Previous Medications   CYCLOBENZAPRINE (FLEXERIL) 10 MG TABLET    Take 1 tablet (10 mg total) by mouth 3 (three) times daily as needed for muscle spasms. Take 1/2- 1 by mouth 3 times  daily as needed for back pain   NAPROXEN (NAPROSYN) 500 MG TABLET    TAKE 1 TABLET(S) TWICE A DAY BY ORAL ROUTE AFTER MEALS FOR 30 DAYS.   NON FORMULARY    C-PAP machine, use at night    TRAMADOL (ULTRAM) 50 MG TABLET    TAKE 1 TABLET BY MOUTH AT BEDTIME AS NEEDED FOR PAIN  Modified Medications   Modified Medication Previous Medication   FLUTICASONE (FLONASE) 50 MCG/ACT NASAL SPRAY fluticasone (FLONASE) 50 MCG/ACT nasal spray      PLACE 2 SPRAYS INTO THE NOSE ONCE DAILY.    PLACE 2 SPRAYS INTO THE NOSE ONCE DAILY.   SERTRALINE (ZOLOFT) 50 MG TABLET sertraline (ZOLOFT) 50 MG tablet      Take 2 tablets (100 mg total) by mouth daily.    TAKE 2 TABLETS BY MOUTH DAILY  Discontinued Medications   ACETAZOLAMIDE (DIAMOX) 250 MG TABLET    TAKE 1/2 TABLET BY MOUTH 2 TIMES DAILY. START 1 DAY BEFORE ALTITUDE GAIN

## 2015-04-23 NOTE — Progress Notes (Signed)
Pre visit review using our clinic review tool, if applicable. No additional management support is needed unless otherwise documented below in the visit note. 

## 2016-01-29 ENCOUNTER — Encounter: Payer: Self-pay | Admitting: Internal Medicine

## 2016-01-29 ENCOUNTER — Ambulatory Visit (INDEPENDENT_AMBULATORY_CARE_PROVIDER_SITE_OTHER): Payer: Managed Care, Other (non HMO) | Admitting: Internal Medicine

## 2016-01-29 VITALS — BP 134/90 | HR 76 | Temp 98.2°F | Wt 225.0 lb

## 2016-01-29 DIAGNOSIS — M546 Pain in thoracic spine: Secondary | ICD-10-CM

## 2016-01-29 DIAGNOSIS — H9191 Unspecified hearing loss, right ear: Secondary | ICD-10-CM | POA: Diagnosis not present

## 2016-01-29 DIAGNOSIS — F5102 Adjustment insomnia: Secondary | ICD-10-CM

## 2016-01-29 MED ORDER — CYCLOBENZAPRINE HCL 10 MG PO TABS: 10.0000 mg | ORAL_TABLET | Freq: Three times a day (TID) | ORAL | 0 refills | Status: DC | PRN

## 2016-01-29 NOTE — Patient Instructions (Signed)
Back Exercises Introduction If you have pain in your back, do these exercises 2-3 times each day or as told by your doctor. When the pain goes away, do the exercises once each day, but repeat the steps more times for each exercise (do more repetitions). If you do not have pain in your back, do these exercises once each day or as told by your doctor. Exercises Single Knee to Chest  Do these steps 3-5 times in a row for each leg: 1. Lie on your back on a firm bed or the floor with your legs stretched out. 2. Bring one knee to your chest. 3. Hold your knee to your chest by grabbing your knee or thigh. 4. Pull on your knee until you feel a gentle stretch in your lower back. 5. Keep doing the stretch for 10-30 seconds. 6. Slowly let go of your leg and straighten it. Pelvic Tilt  Do these steps 5-10 times in a row: 1. Lie on your back on a firm bed or the floor with your legs stretched out. 2. Bend your knees so they point up to the ceiling. Your feet should be flat on the floor. 3. Tighten your lower belly (abdomen) muscles to press your lower back against the floor. This will make your tailbone point up to the ceiling instead of pointing down to your feet or the floor. 4. Stay in this position for 5-10 seconds while you gently tighten your muscles and breathe evenly. Cat-Cow  Do these steps until your lower back bends more easily: 1. Get on your hands and knees on a firm surface. Keep your hands under your shoulders, and keep your knees under your hips. You may put padding under your knees. 2. Let your head hang down, and make your tailbone point down to the floor so your lower back is round like the back of a cat. 3. Stay in this position for 5 seconds. 4. Slowly lift your head and make your tailbone point up to the ceiling so your back hangs low (sags) like the back of a cow. 5. Stay in this position for 5 seconds. Press-Ups  Do these steps 5-10 times in a row: 1. Lie on your belly  (face-down) on the floor. 2. Place your hands near your head, about shoulder-width apart. 3. While you keep your back relaxed and keep your hips on the floor, slowly straighten your arms to raise the top half of your body and lift your shoulders. Do not use your back muscles. To make yourself more comfortable, you may change where you place your hands. 4. Stay in this position for 5 seconds. 5. Slowly return to lying flat on the floor. Bridges  Do these steps 10 times in a row: 1. Lie on your back on a firm surface. 2. Bend your knees so they point up to the ceiling. Your feet should be flat on the floor. 3. Tighten your butt muscles and lift your butt off of the floor until your waist is almost as high as your knees. If you do not feel the muscles working in your butt and the back of your thighs, slide your feet 1-2 inches farther away from your butt. 4. Stay in this position for 3-5 seconds. 5. Slowly lower your butt to the floor, and let your butt muscles relax. If this exercise is too easy, try doing it with your arms crossed over your chest. Belly Crunches  Do these steps 5-10 times in a row: 1. Lie   on your back on a firm bed or the floor with your legs stretched out. 2. Bend your knees so they point up to the ceiling. Your feet should be flat on the floor. 3. Cross your arms over your chest. 4. Tip your chin a little bit toward your chest but do not bend your neck. 5. Tighten your belly muscles and slowly raise your chest just enough to lift your shoulder blades a tiny bit off of the floor. 6. Slowly lower your chest and your head to the floor. Back Lifts  Do these steps 5-10 times in a row: 1. Lie on your belly (face-down) with your arms at your sides, and rest your forehead on the floor. 2. Tighten the muscles in your legs and your butt. 3. Slowly lift your chest off of the floor while you keep your hips on the floor. Keep the back of your head in line with the curve in your back.  Look at the floor while you do this. 4. Stay in this position for 3-5 seconds. 5. Slowly lower your chest and your face to the floor. Contact a doctor if:  Your back pain gets a lot worse when you do an exercise.  Your back pain does not lessen 2 hours after you exercise. If you have any of these problems, stop doing the exercises. Do not do them again unless your doctor says it is okay. Get help right away if:  You have sudden, very bad back pain. If this happens, stop doing the exercises. Do not do them again unless your doctor says it is okay. This information is not intended to replace advice given to you by your health care provider. Make sure you discuss any questions you have with your health care provider. Document Released: 03/08/2010 Document Revised: 07/12/2015 Document Reviewed: 03/30/2014  2017 Elsevier  

## 2016-01-29 NOTE — Progress Notes (Signed)
Subjective:    Patient ID: Kyle Mercer, male    DOB: [birth year], 45 y.o.   MRN: [id number]  HPI  Pt presents to the clinic today with c/o back pain. This started 3 days ago. The pain is located beneath his shoulder blades on both sides. He describes the pain as sharp and stabbing. The pain does not radiate. It is worse with certain movements. It also hurts if he takes a deep breath but he denies cough or shortness of breath. He denies any injury to the area, but reports he was travelling most of the day Friday. He has tried Ibuprofen, Aleve, ice and heat with minimal relief.  He also reports decreased hearing out of his right ear. He noticed this over the last few months. He denies any ringing in the ear. He is requesting referral to an audiologist today.  He also reports difficulty falling asleep and staying asleep. This has been going on for a number of years, but seems worse in the last few months. He thinks it is because he travels with work all the time. He does not snore that he is aware of and he does not feel fatigued during the day time (although he does have a history of OSA). He has tried Benadryl OTC with good relief, but does not want to become dependent on Benadryl.  Review of Systems      Past Medical History:  Diagnosis Date  . Allergic rhinitis   . Anxiety   . Chronic back pain   . Chronic neck pain   . Depression   . GERD (gastroesophageal reflux disease)   . Headache(784.0)   . History of nephrolithiasis     Current Outpatient Prescriptions  Medication Sig Dispense Refill  . fluticasone (FLONASE) 50 MCG/ACT nasal spray PLACE 2 SPRAYS INTO THE NOSE ONCE DAILY. 16 g 11  . naproxen (NAPROSYN) 500 MG tablet TAKE 1 TABLET(S) TWICE A DAY BY ORAL ROUTE AFTER MEALS FOR 30 DAYS.  2  . NON FORMULARY C-PAP machine, use at night     . sertraline (ZOLOFT) 50 MG tablet Take 2 tablets (100 mg total) by mouth daily. 60 tablet 3   No current facility-administered medications  for this visit.     Allergies  Allergen Reactions  . Cefdinir     REACTION: cramping in stomach, vomiting and diarrhea    Family History  Problem Relation Age of Onset  . Heart disease Other     CAD    Social History   Social History  . Marital status: Married    Spouse name: N/A  . Number of children: N/A  . Years of education: N/A   Occupational History  . [employer]   Social History Main Topics  . Smoking status: Former Smoker    Years: 7.00    Quit date: 02/18/2000  . Smokeless tobacco: Former Systems developer  . Alcohol use Yes  . Drug use: No  . Sexual activity: Not on file   Other Topics Concern  . Not on file   Social History Narrative   Regular exercise- no     Constitutional: Denies fever, malaise, fatigue, headache or abrupt weight changes.  HEENT: Pt reports decreased hearing. Denies eye pain, eye redness, ear pain, ringing in the ears, wax buildup, runny nose, nasal congestion, bloody nose, or sore throat. Respiratory: Denies difficulty breathing, shortness of breath, cough or sputum production.   Cardiovascular: Denies chest pain,  chest tightness, palpitations or swelling in the hands or feet.  Gastrointestinal: Denies abdominal pain, bloating, constipation, diarrhea or blood in the stool.  GU: Denies urgency, frequency, pain with urination, burning sensation, blood in urine, odor or discharge. Musculoskeletal: Pt reports back pain. Denies decrease in range of motion, difficulty with gait or joint pain and swelling.  Skin: Denies redness, rashes, lesions or ulcercations.  Neurological: Pt reports insomnia. Denies dizziness, difficulty with memory, difficulty with speech or problems with balance and coordination.   No other specific complaints in a complete review of systems (except as listed in HPI above).  Objective:   Physical Exam  BP 134/90   Pulse 76   Temp 98.2 F (36.8 C) (Oral)   Wt 225 lb  (102.1 kg)   SpO2 98%   BMI 32.52 kg/m  Wt Readings from Last 3 Encounters:  01/29/16 225 lb (102.1 kg)  04/23/15 217 lb 12 oz (98.8 kg)  11/22/12 201 lb 12 oz (91.5 kg)    General: Appears his stated age, well developed, well nourished in NAD. Skin: Warm, dry and intact. No rashesnoted. HEENT: Ears: Tm's gray and intact, normal light reflex;  Musculoskeletal: Pain with flexion, extension and lateral bending. Normal rotation. No bony tenderness noted over the spine. Pain with palpation in the subscapular region, R>L.  Neurological: Alert and oriented.    BMET    Component Value Date/Time   NA 135 11/18/2012 0929   K 4.2 11/18/2012 0929   CL 102 11/18/2012 0929   CO2 26 11/18/2012 0929   GLUCOSE 93 11/18/2012 0929   BUN 16 11/18/2012 0929   CREATININE 1.1 11/18/2012 0929   CALCIUM 9.5 11/18/2012 0929   GFRNONAA 100.33 06/28/2008 0917    Lipid Panel     Component Value Date/Time   CHOL 212 (H) 11/18/2012 0929   TRIG 96.0 11/18/2012 0929   HDL 40.50 11/18/2012 0929   CHOLHDL 5 11/18/2012 0929   VLDL 19.2 11/18/2012 0929    CBC    Component Value Date/Time   WBC 4.9 11/18/2012 0929   RBC 4.67 11/18/2012 0929   HGB 14.9 11/18/2012 0929   HCT 42.7 11/18/2012 0929   PLT 205.0 11/18/2012 0929   MCV 91.4 11/18/2012 0929   MCHC 35.0 11/18/2012 0929   RDW 12.4 11/18/2012 0929   LYMPHSABS 1.6 11/18/2012 0929   MONOABS 0.4 11/18/2012 0929   EOSABS 0.1 11/18/2012 0929   BASOSABS 0.0 11/18/2012 0929    Hgb A1C No results found for: HGBA1C          Assessment & Plan:   Back pain:  Seems muscular in origin Continue Ibuprofen/Aleve for inflammation Stretching exercises given eRx for Flexeril 10 mg TID, sedation caution given  Decreased hearing, right ear:  Exam benign Make nurse visit for hearing screen  Insomnia:  Try Melatonin OTC Start with 3 mg, go up to 5 mg after 2 weeks if no improvement  RTC as needed or if symptoms persist or worsen Talar Fraley,  [place], NP

## 2016-02-05 ENCOUNTER — Ambulatory Visit (INDEPENDENT_AMBULATORY_CARE_PROVIDER_SITE_OTHER): Payer: Managed Care, Other (non HMO) | Admitting: *Deleted

## 2016-02-05 DIAGNOSIS — H9191 Unspecified hearing loss, right ear: Secondary | ICD-10-CM | POA: Diagnosis not present

## 2016-02-05 NOTE — Progress Notes (Signed)
Patient in for hearing screen due to decreased hearing in right ear. Results in chart for review.

## 2016-06-29 ENCOUNTER — Other Ambulatory Visit: Payer: Self-pay | Admitting: Family Medicine

## 2016-07-02 ENCOUNTER — Telehealth: Payer: Self-pay | Admitting: General Practice

## 2016-07-03 NOTE — Telephone Encounter (Signed)
error 

## 2016-07-26 ENCOUNTER — Other Ambulatory Visit: Payer: Self-pay | Admitting: Family Medicine

## 2016-09-26 ENCOUNTER — Other Ambulatory Visit: Payer: Self-pay | Admitting: Family Medicine

## 2016-10-25 ENCOUNTER — Other Ambulatory Visit: Payer: Self-pay | Admitting: Family Medicine

## 2016-11-03 ENCOUNTER — Other Ambulatory Visit: Payer: Self-pay | Admitting: Family Medicine

## 2016-11-03 ENCOUNTER — Other Ambulatory Visit (INDEPENDENT_AMBULATORY_CARE_PROVIDER_SITE_OTHER): Payer: Managed Care, Other (non HMO)

## 2016-11-03 DIAGNOSIS — Z125 Encounter for screening for malignant neoplasm of prostate: Secondary | ICD-10-CM

## 2016-11-03 DIAGNOSIS — Z0000 Encounter for general adult medical examination without abnormal findings: Secondary | ICD-10-CM | POA: Diagnosis not present

## 2016-11-03 LAB — CBC WITH DIFFERENTIAL/PLATELET
Basophils Absolute: 0.1 10*3/uL (ref 0.0–0.1)
Basophils Relative: 1.2 % (ref 0.0–3.0)
Eosinophils Absolute: 0.2 10*3/uL (ref 0.0–0.7)
Eosinophils Relative: 2.9 % (ref 0.0–5.0)
HCT: 49.9 % (ref 39.0–52.0)
Hemoglobin: 16.9 g/dL (ref 13.0–17.0)
Lymphocytes Relative: 40.7 % (ref 12.0–46.0)
Lymphs Abs: 2.3 10*3/uL (ref 0.7–4.0)
MCHC: 33.8 g/dL (ref 30.0–36.0)
MCV: 98.5 fl (ref 78.0–100.0)
Monocytes Absolute: 0.6 10*3/uL (ref 0.1–1.0)
Monocytes Relative: 10.2 % (ref 3.0–12.0)
Neutro Abs: 2.6 10*3/uL (ref 1.4–7.7)
Neutrophils Relative %: 45 % (ref 43.0–77.0)
Platelets: 188 10*3/uL (ref 150.0–400.0)
RBC: 5.07 Mil/uL (ref 4.22–5.81)
RDW: 13.3 % (ref 11.5–15.5)
WBC: 5.7 10*3/uL (ref 4.0–10.5)

## 2016-11-03 LAB — BASIC METABOLIC PANEL
BUN: 11 mg/dL (ref 6–23)
CO2: 28 mEq/L (ref 19–32)
Calcium: 9.4 mg/dL (ref 8.4–10.5)
Chloride: 103 mEq/L (ref 96–112)
Creatinine, Ser: 0.82 mg/dL (ref 0.40–1.50)
GFR: 107.3 mL/min (ref >60.00)
Glucose, Bld: 107 mg/dL — ABNORMAL HIGH (ref 70–99)
Potassium: 4.3 mEq/L (ref 3.5–5.1)
Sodium: 139 mEq/L (ref 135–145)

## 2016-11-03 LAB — LIPID PANEL
Cholesterol: 275 mg/dL — ABNORMAL HIGH (ref 0–200)
HDL: 60.2 mg/dL (ref >39.00)
LDL Cholesterol: 179 mg/dL — ABNORMAL HIGH (ref 0–99)
NonHDL: 214.66
Total CHOL/HDL Ratio: 5
Triglycerides: 176 mg/dL — ABNORMAL HIGH (ref 0.0–149.0)
VLDL: 35.2 mg/dL (ref 0.0–40.0)

## 2016-11-03 LAB — HEPATIC FUNCTION PANEL
ALT: 69 U/L — ABNORMAL HIGH (ref 0–53)
AST: 67 U/L — ABNORMAL HIGH (ref 0–37)
Albumin: 4.3 g/dL (ref 3.5–5.2)
Alkaline Phosphatase: 65 U/L (ref 39–117)
Bilirubin, Direct: 0.1 mg/dL (ref 0.0–0.3)
Total Bilirubin: 0.5 mg/dL (ref 0.2–1.2)
Total Protein: 7.5 g/dL (ref 6.0–8.3)

## 2016-11-03 LAB — PSA: PSA: 0.51 ng/mL (ref 0.10–4.00)

## 2016-11-10 ENCOUNTER — Encounter: Payer: Self-pay | Admitting: Family Medicine

## 2016-11-10 ENCOUNTER — Ambulatory Visit (INDEPENDENT_AMBULATORY_CARE_PROVIDER_SITE_OTHER): Payer: Managed Care, Other (non HMO) | Admitting: Family Medicine

## 2016-11-10 VITALS — BP 124/86 | HR 84 | Temp 98.6°F | Ht 69.75 in | Wt 214.8 lb

## 2016-11-10 DIAGNOSIS — Z23 Encounter for immunization: Secondary | ICD-10-CM

## 2016-11-10 DIAGNOSIS — R7989 Other specified abnormal findings of blood chemistry: Secondary | ICD-10-CM

## 2016-11-10 DIAGNOSIS — R945 Abnormal results of liver function studies: Secondary | ICD-10-CM

## 2016-11-10 DIAGNOSIS — Z0000 Encounter for general adult medical examination without abnormal findings: Secondary | ICD-10-CM

## 2016-11-10 NOTE — Patient Instructions (Signed)
Insomnia:  Melatonin up to 10 mg can be taken 1 hour before sleep very safely every day  Antihistamines: 2 tabs of Benadryl is OK Or can also take 2 Dramamine (get the older version that can cause drowsiness) Or Unisom (doxylamine)

## 2016-11-10 NOTE — Progress Notes (Signed)
Dr. Frederico Hamman T. Piedad Standiford, MD, [facility] [place], [address] Phone: [phone] Fax: [phone]  11/10/2016  Patient: Kyle Mercer, MRN: [id number], DOB: [birth year], 46 y.o.  Primary Physician:  Owens Loffler, MD   Chief Complaint  Patient presents with  . Annual Exam   Subjective:   Kyle Mercer is a 46 y.o. pleasant patient who presents with the following:  Preventative Health Maintenance Visit:  Health Maintenance Summary Reviewed and updated, unless pt declines services.  Tobacco History Reviewed. Alcohol: 4 glasses of wine a night Exercise Habits: Some activity, rec at least 30 mins 5 times a week STD concerns: no risk or activity to increase risk Drug Use: None Encouraged self-testicular check  Health Maintenance  Topic Date Due  . HIV Screening  05/26/1985  . TETANUS/TDAP  11/23/2022  . INFLUENZA VACCINE  Completed   Immunization History  Administered Date(s) Administered  . Influenza,inj,Quad PF,6+ Mos 11/22/2012, 11/10/2016  . Influenza-Unspecified 02/18/2015  . Tdap 11/22/2012   Patient Active Problem List   Diagnosis Date Noted  . SLEEP APNEA 06/25/2009  . OTHER MALAISE AND FATIGUE 06/28/2008  . Anxiety state 06/25/2008  . DEPRESSION 06/25/2008  . Allergic rhinitis 06/25/2008  . GERD 06/25/2008  . NEPHROLITHIASIS, HX OF 06/25/2008   Past Medical History:  Diagnosis Date  . Allergic rhinitis   . Anxiety   . Chronic back pain   . Chronic neck pain   . Depression   . GERD (gastroesophageal reflux disease)   . Headache(784.0)   . History of nephrolithiasis    No past surgical history on file. Social History   Social History  . Marital status: Married    Spouse name: N/A  . Number of children: N/A  . Years of education: N/A   Occupational History  . [employer]   Social History Main Topics  . Smoking status: Former  Smoker    Years: 7.00    Quit date: 02/18/2000  . Smokeless tobacco: Former Systems developer  . Alcohol use Yes  . Drug use: No  . Sexual activity: Not on file   Other Topics Concern  . Not on file   Social History Narrative   Regular exercise- no   Family History  Problem Relation Age of Onset  . Heart disease Other        CAD   Allergies  Allergen Reactions  . Cefdinir     REACTION: cramping in stomach, vomiting and diarrhea    Medication list has been reviewed and updated.   General: Denies fever, chills, sweats. No significant weight loss. Eyes: Denies blurring,significant itching ENT: Denies earache, sore throat, and hoarseness. Cardiovascular: Denies chest pains, palpitations, dyspnea on exertion Respiratory: Denies cough, dyspnea at rest,wheeezing Breast: no concerns about lumps GI: Denies nausea, vomiting, diarrhea, constipation, change in bowel habits, abdominal pain, melena, hematochezia GU: Denies penile discharge, ED, urinary flow / outflow problems. No STD concerns. Musculoskeletal: Denies back pain, joint pain Derm: Denies rash, itching Neuro: Denies  paresthesias, frequent falls, frequent headaches Psych: Denies depression, anxiety Endocrine: Denies cold intolerance, heat intolerance, polydipsia Heme: Denies enlarged lymph nodes Allergy: No hayfever  Objective:   BP 124/86   Pulse 84   Temp 98.6 F (37 C) (Oral)   Ht 5' 9.75" (1.772 m)   Wt 214 lb 12 oz (97.4 kg)   BMI 31.03 kg/m  Ideal Body Weight: Weight in (lb)  to have BMI = 25: 172.6  No exam data present  GEN: well developed, well nourished, no acute distress Eyes: conjunctiva and lids normal, PERRLA, EOMI ENT: TM clear, nares clear, oral exam WNL Neck: supple, no lymphadenopathy, no thyromegaly, no JVD Pulm: clear to auscultation and percussion, respiratory effort normal CV: regular rate and rhythm, S1-S2, no murmur, rub or gallop, no bruits, peripheral pulses normal and symmetric, no cyanosis,  clubbing, edema or varicosities GI: soft, non-tender; no hepatosplenomegaly, masses; active bowel sounds all quadrants GU: no hernia, testicular mass, penile discharge Lymph: no cervical, axillary or inguinal adenopathy MSK: gait normal, muscle tone and strength WNL, no joint swelling, effusions, discoloration, crepitus  SKIN: clear, good turgor, color WNL, no rashes, lesions, or ulcerations Neuro: normal mental status, normal strength, sensation, and motion Psych: alert; oriented to person, place and time, normally interactive and not anxious or depressed in appearance. All labs reviewed with patient.  Lipids:    Component Value Date/Time   CHOL 275 (H) 11/03/2016 1001   TRIG 176.0 (H) 11/03/2016 1001   HDL 60.20 11/03/2016 1001   LDLDIRECT 163.7 11/18/2012 0929   VLDL 35.2 11/03/2016 1001   CHOLHDL 5 11/03/2016 1001   CBC: CBC Latest Ref Rng & Units 11/03/2016 11/18/2012 06/28/2008  WBC 4.0 - 10.5 K/uL 5.7 4.9 5.0  Hemoglobin 13.0 - 17.0 g/dL 16.9 14.9 15.2  Hematocrit 39.0 - 52.0 % 49.9 42.7 43.6  Platelets 150.0 - 400.0 K/uL 188.0 205.0 329.9    Basic Metabolic Panel:    Component Value Date/Time   NA 139 11/03/2016 1001   K 4.3 11/03/2016 1001   CL 103 11/03/2016 1001   CO2 28 11/03/2016 1001   BUN 11 11/03/2016 1001   CREATININE 0.82 11/03/2016 1001   GLUCOSE 107 (H) 11/03/2016 1001   CALCIUM 9.4 11/03/2016 1001   Hepatic Function Latest Ref Rng & Units 11/03/2016 11/18/2012 06/28/2008  Total Protein 6.0 - 8.3 g/dL 7.5 7.3 7.3  Albumin 3.5 - 5.2 g/dL 4.3 4.4 4.7  AST 0 - 37 U/L 67(H) 37 34  ALT 0 - 53 U/L 69(H) 52 55(H)  Alk Phosphatase 39 - 117 U/L 65 39 33(L)  Total Bilirubin 0.2 - 1.2 mg/dL 0.5 0.9 0.9  Bilirubin, Direct 0.0 - 0.3 mg/dL 0.1 0.1 0.1    Lab Results  Component Value Date   TSH 2.15 12/03/2010   Lab Results  Component Value Date   PSA 0.51 11/03/2016    Assessment and Plan:   Healthcare maintenance  Need for influenza vaccination - Plan:  Flu Vaccine QUAD 36+ mos IM  Elevated LFTs  His cholesterol aside, he is not eating well, he is not exercising, and he is drinking too much.  He understands that these are not healthy behaviors, and he is going to try to curb much of his excesses and exercise better.  Recheck in 6 months.  Health Maintenance Exam: The patient's preventative maintenance and recommended screening tests for an annual wellness exam were reviewed in full today. Brought up to date unless services declined.  Counselled on the importance of diet, exercise, and its role in overall health and mortality. The patient's FH and SH was reviewed, including their home life, tobacco status, and drug and alcohol status.  Follow-up in 1 year for physical exam or additional follow-up below.  Follow-up: Return in about 6 months (around 05/10/2017) for labs before. Or follow-up in 1 year if not noted.  Meds ordered this encounter  Medications  . naproxen  sodium (ALEVE) 220 MG tablet    Sig: Take 220 mg by mouth daily as needed.  . sertraline (ZOLOFT) 50 MG tablet    Sig: Take 50 mg by mouth daily.   Medications Discontinued During This Encounter  Medication Reason  . naproxen (NAPROSYN) 500 MG tablet Completed Course  . sertraline (ZOLOFT) 50 MG tablet Change in therapy   Orders Placed This Encounter  Procedures  . Flu Vaccine QUAD 36+ mos IM    Signed,  Itzelle Gains T. Haylin Camilli, MD   Allergies as of 11/10/2016      Reactions   Cefdinir    REACTION: cramping in stomach, vomiting and diarrhea      Medication List       Accurate as of 11/10/16 11:59 PM. Always use your most recent med list.          ALEVE 220 MG tablet Generic drug:  naproxen sodium Take 220 mg by mouth daily as needed.   cyclobenzaprine 10 MG tablet Commonly known as:  FLEXERIL Take 1 tablet (10 mg total) by mouth 3 (three) times daily as needed for muscle spasms.   fluticasone 50 MCG/ACT nasal spray Commonly known as:  FLONASE PLACE 2  SPRAYS INTO THE NOSE ONCE DAILY.   NON FORMULARY C-PAP machine, use at night   sertraline 50 MG tablet Commonly known as:  ZOLOFT Take 50 mg by mouth daily.            Discharge Care Instructions        Start     Ordered   11/10/16 0000  Flu Vaccine QUAD 36+ mos IM     11/10/16 1528

## 2016-11-11 ENCOUNTER — Encounter: Payer: Self-pay | Admitting: Family Medicine

## 2016-12-12 ENCOUNTER — Telehealth: Payer: Self-pay | Admitting: Family Medicine

## 2016-12-12 MED ORDER — SERTRALINE HCL 50 MG PO TABS: 50.0000 mg | ORAL_TABLET | Freq: Every day | ORAL | 1 refills | Status: DC

## 2016-12-12 NOTE — Telephone Encounter (Signed)
Copied from [facility] #[id number]. Topic: Quick Communication - See Telephone Encounter >> Dec 12, 2016 12:32 PM Neva Seat wrote: Reason for CRM:   Needs Sertraline called in to [facility] in [place].  Rx was changed to 50 mg

## 2016-12-12 NOTE — Telephone Encounter (Signed)
Sertraline was increased to 50mg .  Requesting it be called in to [facility] in [place].   Thanks.

## 2016-12-12 NOTE — Telephone Encounter (Signed)
Sertraline 50 mg sent into [facility] in [place] as requested.

## 2017-02-27 ENCOUNTER — Ambulatory Visit (INDEPENDENT_AMBULATORY_CARE_PROVIDER_SITE_OTHER): Payer: Managed Care, Other (non HMO) | Admitting: Primary Care

## 2017-02-27 ENCOUNTER — Encounter: Payer: Self-pay | Admitting: Primary Care

## 2017-02-27 VITALS — BP 128/82 | HR 85 | Temp 98.5°F | Ht 69.75 in | Wt 219.4 lb

## 2017-02-27 DIAGNOSIS — J069 Acute upper respiratory infection, unspecified: Secondary | ICD-10-CM

## 2017-02-27 MED ORDER — ALBUTEROL SULFATE HFA 108 (90 BASE) MCG/ACT IN AERS: 2.0000 | INHALATION_SPRAY | Freq: Four times a day (QID) | RESPIRATORY_TRACT | 0 refills | Status: DC | PRN

## 2017-02-27 MED ORDER — GUAIFENESIN-CODEINE 100-10 MG/5ML PO SYRP: 5.0000 mL | ORAL_SOLUTION | Freq: Three times a day (TID) | ORAL | 0 refills | Status: DC | PRN

## 2017-02-27 NOTE — Patient Instructions (Signed)
Your symptoms are representative of a viral illness which will resolve on its own over time. Our goal is to treat your symptoms in order to aid your body in the healing process and to make you more comfortable.   You may take the cough suppressant three times daily as needed for cough and rest. Caution this medication contains codeine and will make you feel drowsy.  Shortness of Breath/Wheezing/Cough: Use the albuterol inhaler. Inhale 2 puffs into the lungs every 4 to 6 hours as needed for wheezing, cough, and/or shortness of breath.   Please notify me if you develop persistent fevers of 101, start coughing up green mucous, notice increased fatigue or weakness, or feel worse after 1 week of onset of symptoms.   Increase consumption of water intake and rest.  It was a pleasure meeting you!   Upper Respiratory Infection, Adult Most upper respiratory infections (URIs) are caused by a virus. A URI affects the nose, throat, and upper air passages. The most common type of URI is often called "the common cold." Follow these instructions at home:  Take medicines only as told by your doctor.  Gargle warm saltwater or take cough drops to comfort your throat as told by your doctor.  Use a warm mist humidifier or inhale steam from a shower to increase air moisture. This may make it easier to breathe.  Drink enough fluid to keep your pee (urine) clear or pale yellow.  Eat soups and other clear broths.  Have a healthy diet.  Rest as needed.  Go back to work when your fever is gone or your doctor says it is okay. ? You may need to stay home longer to avoid giving your URI to others. ? You can also wear a face mask and wash your hands often to prevent spread of the virus.  Use your inhaler more if you have asthma.  Do not use any tobacco products, including cigarettes, chewing tobacco, or electronic cigarettes. If you need help quitting, ask your doctor. Contact a doctor if:  You are getting  worse, not better.  Your symptoms are not helped by medicine.  You have chills.  You are getting more short of breath.  You have brown or red mucus.  You have yellow or brown discharge from your nose.  You have pain in your face, especially when you bend forward.  You have a fever.  You have puffy (swollen) neck glands.  You have pain while swallowing.  You have white areas in the back of your throat. Get help right away if:  You have very bad or constant: ? Headache. ? Ear pain. ? Pain in your forehead, behind your eyes, and over your cheekbones (sinus pain). ? Chest pain.  You have long-lasting (chronic) lung disease and any of the following: ? Wheezing. ? Long-lasting cough. ? Coughing up blood. ? A change in your usual mucus.  You have a stiff neck.  You have changes in your: ? Vision. ? Hearing. ? Thinking. ? Mood. This information is not intended to replace advice given to you by your health care provider. Make sure you discuss any questions you have with your health care provider. Document Released: 07/23/2007 Document Revised: 10/07/2015 Document Reviewed: 05/11/2013 Elsevier Interactive Patient Education  2018 [employer].

## 2017-02-27 NOTE — Progress Notes (Signed)
Subjective:    Patient ID: Kyle Mercer, male    DOB: Aug 05, 1970, 47 y.o.   MRN: [id number]  HPI  Kyle Mercer is a 47 year old male with a history of allergic rhinitis, GERD, sleep apnea who presents today with a chief complaint of cough. He also reports sinus pressure, post nasal drip, nasal congestion, sore throat from coughing. His cough is productive with a darker color than usual. His symptoms began 5 days ago. He's taken guaifenesin twice daily, Aleve once daily, Ibuprofen once to twice daily, and Flonase. His cough seems better today. He denies fevers.   Review of Systems  Constitutional: Positive for chills and fatigue. Negative for fever.  HENT: Positive for congestion, postnasal drip, sinus pressure and sore throat.   Respiratory: Positive for cough. Negative for shortness of breath.        Past Medical History:  Diagnosis Date  . Allergic rhinitis   . Anxiety   . Chronic back pain   . Chronic neck pain   . Depression   . GERD (gastroesophageal reflux disease)   . Headache(784.0)   . History of nephrolithiasis      Social History   Socioeconomic History  . Marital status: Married    Spouse name: Not on file  . Number of children: Not on file  . Years of education: Not on file  . Highest education level: Not on file  Social Needs  . Financial resource strain: Not on file  . Food insecurity - worry: Not on file  . Food insecurity - inability: Not on file  . Transportation needs - medical: Not on file  . Transportation needs - non-medical: Not on file  Occupational History  . Occupation: Dentist: [employer]    Comment: [employer]  Tobacco Use  . Smoking status: Former Smoker    Years: 7.00    Last attempt to quit: 02/18/2000    Years since quitting: 17.0  . Smokeless tobacco: Former Network engineer and Sexual Activity  . Alcohol use: Yes    Alcohol/week: 16.8 oz    Types: 28 Glasses of wine per week  . Drug use:  No  . Sexual activity: Yes    Partners: Female  Other Topics Concern  . Not on file  Social History Narrative   Regular exercise- no    No past surgical history on file.  Family History  Problem Relation Age of Onset  . Heart disease Other        CAD    Allergies  Allergen Reactions  . Cefdinir     REACTION: cramping in stomach, vomiting and diarrhea    Current Outpatient Medications on File Prior to Visit  Medication Sig Dispense Refill  . fluticasone (FLONASE) 50 MCG/ACT nasal spray PLACE 2 SPRAYS INTO THE NOSE ONCE DAILY. 16 g 11  . naproxen sodium (ALEVE) 220 MG tablet Take 220 mg by mouth daily as needed.    . NON FORMULARY C-PAP machine, use at night     . sertraline (ZOLOFT) 50 MG tablet Take 1 tablet (50 mg total) by mouth daily. 90 tablet 1  . cyclobenzaprine (FLEXERIL) 10 MG tablet Take 1 tablet (10 mg total) by mouth 3 (three) times daily as needed for muscle spasms. (Patient not taking: Reported on 02/27/2017) 30 tablet 0   No current facility-administered medications on file prior to visit.     BP 128/82   Pulse 85   Temp  98.5 F (36.9 C) (Oral)   Ht 5' 9.75" (1.772 m)   Wt 219 lb 6.4 oz (99.5 kg)   SpO2 97%   BMI 31.71 kg/m    Objective:   Physical Exam  Constitutional: He appears well-nourished.  HENT:  Right Ear: Tympanic membrane and ear canal normal.  Left Ear: Tympanic membrane and ear canal normal.  Nose: Mucosal edema present. Right sinus exhibits no maxillary sinus tenderness and no frontal sinus tenderness. Left sinus exhibits no maxillary sinus tenderness and no frontal sinus tenderness.  Mouth/Throat: Oropharynx is clear and moist.  Eyes: Conjunctivae are normal.  Neck: Neck supple.  Cardiovascular: Normal rate and regular rhythm.  Pulmonary/Chest: Effort normal. He has wheezes in the right upper field and the left upper field. He has no rales.  Mild wheezing  Skin: Skin is warm and dry.          Assessment & Plan:   URI:  Cough, congestion, fatigue x 5 days. Cough slightly better today. Exam with mild wheezing to upper fields, lungs otherwise unremarkable. Does appear sick but stable. Suspect viral involvement at this point, will treat with conservative measures. Rx for Cheratussin printed to use PRN, drowsiness precautions provided. Rx for albuterol sent to pharmacy to use PRN wheezing, cough. Return precautions provided.  Sheral Flow, NP

## 2017-05-11 ENCOUNTER — Other Ambulatory Visit: Payer: Self-pay

## 2017-05-11 ENCOUNTER — Encounter: Payer: Self-pay | Admitting: Family Medicine

## 2017-05-11 ENCOUNTER — Ambulatory Visit (INDEPENDENT_AMBULATORY_CARE_PROVIDER_SITE_OTHER): Payer: Managed Care, Other (non HMO) | Admitting: Family Medicine

## 2017-05-11 VITALS — BP 118/80 | HR 94 | Temp 98.6°F | Ht 69.75 in | Wt 207.2 lb

## 2017-05-11 DIAGNOSIS — E785 Hyperlipidemia, unspecified: Secondary | ICD-10-CM | POA: Diagnosis not present

## 2017-05-11 DIAGNOSIS — R7989 Other specified abnormal findings of blood chemistry: Secondary | ICD-10-CM

## 2017-05-11 DIAGNOSIS — R945 Abnormal results of liver function studies: Secondary | ICD-10-CM | POA: Diagnosis not present

## 2017-05-11 NOTE — Progress Notes (Signed)
Dr. Frederico Hamman T. Nolberto Cheuvront, MD, [facility] [place], [address] Phone: [phone] Fax: [phone]  05/11/2017  Patient: CHENG DEC, MRN: [id number], DOB: [birth year], 47 y.o.  Primary Physician:  Owens Loffler, MD   Chief Complaint  Patient presents with  . Follow-up    6 months-cholesterol  . Throat Issue    Feels like something stuck in back of throat   Subjective:   KORRY DALGLEISH is a 47 y.o. very pleasant male patient who presents with the following:  12 pound weight loss.   Cut back on the ETOH. Now only about 2 on Friday and sat only.  Has been good with diet and had some gain back due to holidays with loss after.  Just ate some bojangles.   Past Medical History, Surgical History, Social History, Family History, Problem List, Medications, and Allergies have been reviewed and updated if relevant.  Patient Active Problem List   Diagnosis Date Noted  . SLEEP APNEA 06/25/2009  . OTHER MALAISE AND FATIGUE 06/28/2008  . Anxiety state 06/25/2008  . Allergic rhinitis 06/25/2008  . GERD 06/25/2008  . NEPHROLITHIASIS, HX OF 06/25/2008    Past Medical History:  Diagnosis Date  . Allergic rhinitis   . Anxiety   . Chronic back pain   . Chronic neck pain   . Depression   . GERD (gastroesophageal reflux disease)   . Headache(784.0)   . History of nephrolithiasis     History reviewed. No pertinent surgical history.  Social History   Socioeconomic History  . Marital status: Married    Spouse name: Not on file  . Number of children: Not on file  . Years of education: Not on file  . Highest education level: Not on file  Occupational History  . 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 Financial resource strain: Not on file  . Food insecurity:    Worry: Not on file    Inability: Not on file  . Transportation needs:   Medical: Not on file    Non-medical: Not on file  Tobacco Use  . Smoking status: Former Smoker    Years: 7.00    Last attempt to quit: 02/18/2000    Years since quitting: 17.2  . Smokeless tobacco: Former Network engineer and Sexual Activity  . Alcohol use: Yes    Alcohol/week: 16.8 oz    Types: 28 Glasses of wine per week  . Drug use: No  . Sexual activity: Yes    Partners: Female  Lifestyle  . Physical activity:    Days per week: Not on file    Minutes per session: Not on file  . Stress: Not on file  Relationships  . Social connections:    Talks on phone: Not on file    Gets together: Not on file    Attends religious service: Not on file    Active member of club or organization: Not on file    Attends meetings of clubs or organizations: Not on file    Relationship status: Not on file  . Intimate partner violence:    Fear of current or ex partner: Not on file    Emotionally abused: Not on file    Physically abused: Not on file    Forced sexual activity: Not on file  Other Topics Concern  . Not on file  Social  History Narrative   Regular exercise- no    Family History  Problem Relation Age of Onset  . Heart disease Other        CAD    Allergies  Allergen Reactions  . Cefdinir     REACTION: cramping in stomach, vomiting and diarrhea    Medication list reviewed and updated in full in [place].   GEN: No acute illnesses, no fevers, chills. GI: No n/v/d, eating normally Pulm: No SOB Interactive and getting along well at home.  Otherwise, ROS is as per the HPI.  Objective:   BP 118/80   Pulse 94   Temp 98.6 F (37 C) (Oral)   Ht 5' 9.75" (1.772 m)   Wt 207 lb 4 oz (94 kg)   BMI 29.95 kg/m   GEN: WDWN, NAD, Non-toxic, A & O x 3 HEENT: Atraumatic, Normocephalic. Neck supple. No masses, No LAD. Ears and Nose: No external deformity. CV: RRR, No M/G/R. No JVD. No thrill. No extra heart sounds. PULM: CTA B, no wheezes, crackles, rhonchi. No  retractions. No resp. distress. No accessory muscle use. EXTR: No c/c/e NEURO Normal gait.  PSYCH: Normally interactive. Conversant. Not depressed or anxious appearing.  Calm demeanor.   Laboratory and Imaging Data:  Assessment and Plan:   Hyperlipidemia LDL goal <100 - Plan: Lipid panel  Elevated LFTs - Plan: Hepatic function panel  Check labs in a few labs in a fasting state  Follow-up: Return in about 6 months (around 11/11/2017) for complete physical.  Orders Placed This Encounter  Procedures  . Hepatic function panel  . Lipid panel    Signed,  Frederico Hamman T. Webber Michiels, MD   Allergies as of 05/11/2017      Reactions   Cefdinir    REACTION: cramping in stomach, vomiting and diarrhea      Medication List        Accurate as of 05/11/17 11:59 PM. Always use your most recent med list.          ALEVE 220 MG tablet Generic drug:  naproxen sodium Take 220 mg by mouth daily as needed.   cyclobenzaprine 10 MG tablet Commonly known as:  FLEXERIL Take 1 tablet (10 mg total) by mouth 3 (three) times daily as needed for muscle spasms.   fluticasone 50 MCG/ACT nasal spray Commonly known as:  FLONASE PLACE 2 SPRAYS INTO THE NOSE ONCE DAILY.   NON FORMULARY C-PAP machine, use at night   sertraline 50 MG tablet Commonly known as:  ZOLOFT Take 1 tablet (50 mg total) by mouth daily.

## 2017-05-21 NOTE — Addendum Note (Signed)
Addended by: Ellamae Sia on: 05/21/2017 10:53 AM   Modules accepted: Orders

## 2017-05-22 LAB — HEPATIC FUNCTION PANEL
ALT: 75 IU/L — ABNORMAL HIGH (ref 0–44)
AST: 61 IU/L — ABNORMAL HIGH (ref 0–40)
Albumin: 4.7 g/dL (ref 3.5–5.5)
Alkaline Phosphatase: 61 IU/L (ref 39–117)
Bilirubin Total: 0.4 mg/dL (ref 0.0–1.2)
Bilirubin, Direct: 0.17 mg/dL (ref 0.00–0.40)
Total Protein: 7.4 g/dL (ref 6.0–8.5)

## 2017-05-22 LAB — LIPID PANEL
Chol/HDL Ratio: 4.8 ratio (ref 0.0–5.0)
Cholesterol, Total: 242 mg/dL — ABNORMAL HIGH (ref 100–199)
HDL: 50 mg/dL (ref >39)
LDL Calculated: 162 mg/dL — ABNORMAL HIGH (ref 0–99)
Triglycerides: 149 mg/dL (ref 0–149)
VLDL Cholesterol Cal: 30 mg/dL (ref 5–40)

## 2017-11-09 ENCOUNTER — Other Ambulatory Visit: Payer: Self-pay | Admitting: Family Medicine

## 2017-11-11 ENCOUNTER — Ambulatory Visit (INDEPENDENT_AMBULATORY_CARE_PROVIDER_SITE_OTHER): Payer: Managed Care, Other (non HMO) | Admitting: Family Medicine

## 2017-11-11 ENCOUNTER — Encounter: Payer: Self-pay | Admitting: Family Medicine

## 2017-11-11 VITALS — BP 120/80 | HR 67 | Temp 98.3°F | Ht 69.75 in | Wt 216.5 lb

## 2017-11-11 DIAGNOSIS — Z23 Encounter for immunization: Secondary | ICD-10-CM

## 2017-11-11 DIAGNOSIS — Z114 Encounter for screening for human immunodeficiency virus [HIV]: Secondary | ICD-10-CM

## 2017-11-11 DIAGNOSIS — Z0000 Encounter for general adult medical examination without abnormal findings: Secondary | ICD-10-CM

## 2017-11-11 LAB — LIPID PANEL
Cholesterol: 242 mg/dL — ABNORMAL HIGH (ref 0–200)
HDL: 47.8 mg/dL (ref >39.00)
LDL Cholesterol: 161 mg/dL — ABNORMAL HIGH (ref 0–99)
NonHDL: 194.24
Total CHOL/HDL Ratio: 5
Triglycerides: 166 mg/dL — ABNORMAL HIGH (ref 0.0–149.0)
VLDL: 33.2 mg/dL (ref 0.0–40.0)

## 2017-11-11 LAB — HEPATIC FUNCTION PANEL
ALT: 43 U/L (ref 0–53)
AST: 39 U/L — ABNORMAL HIGH (ref 0–37)
Albumin: 4.3 g/dL (ref 3.5–5.2)
Alkaline Phosphatase: 59 U/L (ref 39–117)
Bilirubin, Direct: 0.1 mg/dL (ref 0.0–0.3)
Total Bilirubin: 0.6 mg/dL (ref 0.2–1.2)
Total Protein: 7.3 g/dL (ref 6.0–8.3)

## 2017-11-11 LAB — BASIC METABOLIC PANEL
BUN: 12 mg/dL (ref 6–23)
CO2: 28 mEq/L (ref 19–32)
Calcium: 9.1 mg/dL (ref 8.4–10.5)
Chloride: 99 mEq/L (ref 96–112)
Creatinine, Ser: 0.85 mg/dL (ref 0.40–1.50)
GFR: 102.49 mL/min (ref >60.00)
Glucose, Bld: 103 mg/dL — ABNORMAL HIGH (ref 70–99)
Potassium: 4.4 mEq/L (ref 3.5–5.1)
Sodium: 135 mEq/L (ref 135–145)

## 2017-11-11 LAB — CBC WITH DIFFERENTIAL/PLATELET
Basophils Absolute: 0.1 10*3/uL (ref 0.0–0.1)
Basophils Relative: 1.4 % (ref 0.0–3.0)
Eosinophils Absolute: 0.2 10*3/uL (ref 0.0–0.7)
Eosinophils Relative: 3.5 % (ref 0.0–5.0)
HCT: 46.9 % (ref 39.0–52.0)
Hemoglobin: 16.4 g/dL (ref 13.0–17.0)
Lymphocytes Relative: 38.5 % (ref 12.0–46.0)
Lymphs Abs: 2.2 10*3/uL (ref 0.7–4.0)
MCHC: 35 g/dL (ref 30.0–36.0)
MCV: 95 fl (ref 78.0–100.0)
Monocytes Absolute: 0.5 10*3/uL (ref 0.1–1.0)
Monocytes Relative: 9.6 % (ref 3.0–12.0)
Neutro Abs: 2.6 10*3/uL (ref 1.4–7.7)
Neutrophils Relative %: 47 % (ref 43.0–77.0)
Platelets: 157 10*3/uL (ref 150.0–400.0)
RBC: 4.93 Mil/uL (ref 4.22–5.81)
RDW: 12.5 % (ref 11.5–15.5)
WBC: 5.6 10*3/uL (ref 4.0–10.5)

## 2017-11-11 NOTE — Progress Notes (Signed)
Dr. Frederico Hamman T. Carsyn Taubman, MD, [facility] [place], [address] Phone: [phone] Fax: [phone]  11/11/2017  Patient: Kyle Mercer, MRN: [id number], DOB: [birth year], 47 y.o.  Primary Physician:  Owens Loffler, MD   Chief Complaint  Patient presents with  . Annual Exam   Subjective:   Kyle Mercer is a 47 y.o. pleasant patient who presents with the following:  Preventative Health Maintenance Visit:  Health Maintenance Summary Reviewed and updated, unless pt declines services.  Tobacco History Reviewed. Alcohol: about 2-3 drinks nightly now Exercise Habits: Some activity, decreased recently over the summer STD concerns: no risk or activity to increase risk Drug Use: None Encouraged self-testicular check  Wt Readings from Last 3 Encounters:  11/11/17 216 lb 8 oz (98.2 kg)  05/11/17 207 lb 4 oz (94 kg)  02/27/17 219 lb 6.4 oz (99.5 kg)    Swimming in the pool.  Walking in the afternoons + elliptical  2-3 glasses a night of alcohol now each night.  Increased stress with wife's father with bad MG and hosp x 7 mo  Health Maintenance  Topic Date Due  . HIV Screening  05/26/1985  . TETANUS/TDAP  11/23/2022  . INFLUENZA VACCINE  Completed   Immunization History  Administered Date(s) Administered  . Influenza,inj,Quad PF,6+ Mos 11/22/2012, 11/10/2016, 11/11/2017  . Influenza,inj,quad, With Preservative 03/05/2015  . Tdap 11/22/2012   Patient Active Problem List   Diagnosis Date Noted  . SLEEP APNEA 06/25/2009  . OTHER MALAISE AND FATIGUE 06/28/2008  . Anxiety state 06/25/2008  . Allergic rhinitis 06/25/2008  . GERD 06/25/2008  . NEPHROLITHIASIS, HX OF 06/25/2008   Past Medical History:  Diagnosis Date  . Allergic rhinitis   . Anxiety   . Chronic back pain   . Chronic neck pain   . Depression   . GERD (gastroesophageal reflux disease)   . Headache(784.0)   . History of nephrolithiasis     History reviewed. No pertinent surgical history. Social History   Socioeconomic History  . Marital status: Married    Spouse name: Not on file  . Number of children: Not on file  . Years of education: Not on file  . Highest education level: Not on file  Occupational History  . 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 Financial resource strain: Not on file  . Food insecurity:    Worry: Not on file    Inability: Not on file  . Transportation needs:    Medical: Not on file    Non-medical: Not on file  Tobacco Use  . Smoking status: Former Smoker    Years: 7.00    Last attempt to quit: 02/18/2000    Years since quitting: 17.7  . Smokeless tobacco: Former Network engineer and Sexual Activity  . Alcohol use: Yes    Alcohol/week: 28.0 standard drinks    Types: 28 Glasses of wine per week  . Drug use: No  . Sexual activity: Yes    Partners: Female  Lifestyle  . Physical activity:    Days per week: Not on file    Minutes per session: Not on file  . Stress: Not on file  Relationships  . Social connections:    Talks on phone: Not on file    Gets together: Not on file    Attends religious service: Not on file  Active member of club or organization: Not on file    Attends meetings of clubs or organizations: Not on file    Relationship status: Not on file  . Intimate partner violence:    Fear of current or ex partner: Not on file    Emotionally abused: Not on file    Physically abused: Not on file    Forced sexual activity: Not on file  Other Topics Concern  . Not on file  Social History Narrative   Regular exercise- no   Family History  Problem Relation Age of Onset  . Heart disease Other        CAD   Allergies  Allergen Reactions  . Cefdinir     REACTION: cramping in stomach, vomiting and diarrhea   Medication list has been reviewed and updated.  General: Denies fever, chills, sweats.  No significant weight loss. Eyes: Denies blurring,significant itching ENT: Denies earache, sore throat, and hoarseness. Cardiovascular: Denies chest pains, palpitations, dyspnea on exertion Respiratory: Denies cough, dyspnea at rest,wheeezing Breast: no concerns about lumps GI: Denies nausea, vomiting, diarrhea, constipation, change in bowel habits, abdominal pain, melena, hematochezia GU: Denies penile discharge, ED, urinary flow / outflow problems. No STD concerns. Musculoskeletal: Denies back pain, joint pain Derm: Denies rash, itching Neuro: Denies  paresthesias, frequent falls, frequent headaches Psych: Denies depression, anxiety Endocrine: Denies cold intolerance, heat intolerance, polydipsia Heme: Denies enlarged lymph nodes Allergy: No hayfever  Objective:   BP 120/80   Pulse 67   Temp 98.3 F (36.8 C) (Oral)   Ht 5' 9.75" (1.772 m)   Wt 216 lb 8 oz (98.2 kg)   BMI 31.29 kg/m  Ideal Body Weight: Weight in (lb) to have BMI = 25: 172.6  No exam data present  GEN: well developed, well nourished, no acute distress Eyes: conjunctiva and lids normal, PERRLA, EOMI ENT: TM clear, nares clear, oral exam WNL Neck: supple, no lymphadenopathy, no thyromegaly, no JVD Pulm: clear to auscultation and percussion, respiratory effort normal CV: regular rate and rhythm, S1-S2, no murmur, rub or gallop, no bruits, peripheral pulses normal and symmetric, no cyanosis, clubbing, edema or varicosities GI: soft, non-tender; no hepatosplenomegaly, masses; active bowel sounds all quadrants GU: no hernia, testicular mass, penile discharge Lymph: no cervical, axillary or inguinal adenopathy MSK: gait normal, muscle tone and strength WNL, no joint swelling, effusions, discoloration, crepitus  SKIN: clear, good turgor, color WNL, no rashes, lesions, or ulcerations Neuro: normal mental status, normal strength, sensation, and motion Psych: alert; oriented to person, place and time, normally  interactive and not anxious or depressed in appearance.  All labs reviewed with patient.  Lipids:    Component Value Date/Time   CHOL 242 (H) 05/21/2017 1108   TRIG 149 05/21/2017 1108   HDL 50 05/21/2017 1108   LDLDIRECT 163.7 11/18/2012 0929   VLDL 35.2 11/03/2016 1001   CHOLHDL 4.8 05/21/2017 1108   CHOLHDL 5 11/03/2016 1001   CBC: CBC Latest Ref Rng & Units 11/03/2016 11/18/2012 06/28/2008  WBC 4.0 - 10.5 K/uL 5.7 4.9 5.0  Hemoglobin 13.0 - 17.0 g/dL 16.9 14.9 15.2  Hematocrit 39.0 - 52.0 % 49.9 42.7 43.6  Platelets 150.0 - 400.0 K/uL 188.0 205.0 703.5    Basic Metabolic Panel:    Component Value Date/Time   NA 139 11/03/2016 1001   K 4.3 11/03/2016 1001   CL 103 11/03/2016 1001   CO2 28 11/03/2016 1001   BUN 11 11/03/2016 1001   CREATININE 0.82 11/03/2016 1001  GLUCOSE 107 (H) 11/03/2016 1001   CALCIUM 9.4 11/03/2016 1001   Hepatic Function Latest Ref Rng & Units 05/21/2017 11/03/2016 11/18/2012  Total Protein 6.0 - 8.5 g/dL 7.4 7.5 7.3  Albumin 3.5 - 5.5 g/dL 4.7 4.3 4.4  AST 0 - 40 IU/L 61(H) 67(H) 37  ALT 0 - 44 IU/L 75(H) 69(H) 52  Alk Phosphatase 39 - 117 IU/L 61 65 39  Total Bilirubin 0.0 - 1.2 mg/dL 0.4 0.5 0.9  Bilirubin, Direct 0.00 - 0.40 mg/dL 0.17 0.1 0.1    Lab Results  Component Value Date   TSH 2.15 12/03/2010   Lab Results  Component Value Date   PSA 0.51 11/03/2016    Assessment and Plan:   Healthcare maintenance  Need for prophylactic vaccination and inoculation against influenza - Plan: Flu Vaccine QUAD 6+ mos PF IM (Fluarix Quad PF)  Health Maintenance Exam: The patient's preventative maintenance and recommended screening tests for an annual wellness exam were reviewed in full today. Brought up to date unless services declined.  Counselled on the importance of diet, exercise, and its role in overall health and mortality. The patient's FH and SH was reviewed, including their home life, tobacco status, and drug and alcohol  status.  Follow-up in 1 year for physical exam or additional follow-up below.  Follow-up: No follow-ups on file. Or follow-up in 1 year if not noted.  Orders Placed This Encounter  Procedures  . Flu Vaccine QUAD 6+ mos PF IM (Fluarix Quad PF)    Signed,  Benno Brensinger T. Toniann Dickerson, MD   Allergies as of 11/11/2017      Reactions   Cefdinir    REACTION: cramping in stomach, vomiting and diarrhea      Medication List        Accurate as of 11/11/17  9:22 AM. Always use your most recent med list.          ALEVE 220 MG tablet Generic drug:  naproxen sodium Take 220 mg by mouth daily as needed.   cyclobenzaprine 10 MG tablet Commonly known as:  FLEXERIL Take 1 tablet (10 mg total) by mouth 3 (three) times daily as needed for muscle spasms.   fluticasone 50 MCG/ACT nasal spray Commonly known as:  FLONASE PLACE 2 SPRAYS INTO THE NOSE ONCE DAILY.   NON FORMULARY C-PAP machine, use at night   sertraline 50 MG tablet Commonly known as:  ZOLOFT Take 1 tablet (50 mg total) by mouth daily.

## 2017-11-12 LAB — HIV ANTIBODY (ROUTINE TESTING W REFLEX): HIV 1&2 Ab, 4th Generation: NONREACTIVE

## 2017-11-16 ENCOUNTER — Telehealth: Payer: Self-pay | Admitting: Family Medicine

## 2017-11-16 ENCOUNTER — Encounter: Payer: Self-pay | Admitting: Family Medicine

## 2017-11-16 NOTE — Telephone Encounter (Signed)
Mr. Hosang has also sent a MyChart message which I have replied to.  Forms were faxed in today.

## 2017-11-16 NOTE — Telephone Encounter (Signed)
Copied from [place] [id number]. Topic: Quick Communication - Lab Results >> Nov 16, 2017 11:21 AM Kyle Mercer wrote: Pt called to ask pcp/nurse to fax over the paperwork left for pt's lab work; today is the last day to turn that in; pt states the pcp/nurse would know what paperwork was dropped off to record the test results; contact pt to inform this has been done

## 2018-02-05 ENCOUNTER — Encounter: Payer: Self-pay | Admitting: Family Medicine

## 2018-02-05 ENCOUNTER — Ambulatory Visit (INDEPENDENT_AMBULATORY_CARE_PROVIDER_SITE_OTHER): Payer: Managed Care, Other (non HMO) | Admitting: Family Medicine

## 2018-02-05 VITALS — BP 136/84 | HR 94 | Temp 98.7°F | Resp 24 | Ht 69.75 in | Wt 227.0 lb

## 2018-02-05 DIAGNOSIS — R05 Cough: Secondary | ICD-10-CM

## 2018-02-05 DIAGNOSIS — R059 Cough, unspecified: Secondary | ICD-10-CM | POA: Insufficient documentation

## 2018-02-05 MED ORDER — AZITHROMYCIN 250 MG PO TABS: ORAL_TABLET | ORAL | 0 refills | Status: DC

## 2018-02-05 MED ORDER — GUAIFENESIN-CODEINE 100-10 MG/5ML PO SYRP: 5.0000 mL | ORAL_SOLUTION | Freq: Every evening | ORAL | 0 refills | Status: DC | PRN

## 2018-02-05 NOTE — Assessment & Plan Note (Signed)
Given worsening in last 24 hours and new possible fever late in illness... treat with antibiotics. Not clearly flu and pt past treatment range.

## 2018-02-05 NOTE — Progress Notes (Signed)
Subjective:    Patient ID: MAXIMIANO LOTT, male    DOB: 07-Sep-1970, 47 y.o.   MRN: [id number]  URI   This is a new problem. The current episode started in the past 7 days. The problem has been gradually worsening. There has been no fever. Associated symptoms include congestion, coughing, ear pain, headaches, sinus pain and a sore throat. Pertinent negatives include no chest pain or wheezing. Associated symptoms comments: PND, ear congestion  chest tightness, cough worse at night   mucus has darkened in last 24 hours.  chills/sweats... no temp measured. He has tried NSAIDs (guafenesin) for the symptoms. The treatment provided moderate relief.   HX: no asthma, no COPD   Former smoker, remotely.   Blood pressure 136/84, pulse 94, temperature 98.7 F (37.1 C), temperature source Oral, resp. rate (!) 24, height 5' 9.75" (1.772 m), weight 227 lb (103 kg), SpO2 98 %. Social History /Family History/Past Medical History reviewed in detail and updated in EMR if needed.  Review of Systems  HENT: Positive for congestion, ear pain, sinus pain and sore throat.   Respiratory: Positive for cough. Negative for wheezing.   Cardiovascular: Negative for chest pain.  Neurological: Positive for headaches.       Objective:   Physical Exam Constitutional:      General: He is not in acute distress.    Appearance: Normal appearance. He is well-developed. He is not ill-appearing or toxic-appearing.  HENT:     Head: Normocephalic and atraumatic.     Right Ear: Hearing, tympanic membrane, ear canal and external ear normal. No tenderness. No foreign body. Tympanic membrane is not retracted or bulging.     Left Ear: Hearing, tympanic membrane, ear canal and external ear normal. No tenderness. No foreign body. Tympanic membrane is not retracted or bulging.     Nose: Nose normal. No mucosal edema or rhinorrhea.     Right Sinus: No maxillary sinus tenderness or frontal sinus tenderness.     Left Sinus: No  maxillary sinus tenderness or frontal sinus tenderness.     Mouth/Throat:     Dentition: Normal dentition. No dental caries.     Pharynx: Uvula midline. Posterior oropharyngeal erythema present. No oropharyngeal exudate.     Tonsils: No tonsillar abscesses.  Eyes:     General: Lids are normal. Lids are everted, no foreign bodies appreciated.     Conjunctiva/sclera: Conjunctivae normal.     Pupils: Pupils are equal, round, and reactive to light.  Neck:     Musculoskeletal: Normal range of motion and neck supple.     Thyroid: No thyroid mass or thyromegaly.     Vascular: No carotid bruit.     Trachea: Trachea and phonation normal.  Cardiovascular:     Rate and Rhythm: Normal rate and regular rhythm.     Pulses: Normal pulses.     Heart sounds: Normal heart sounds, S1 normal and S2 normal. No murmur. No gallop.   Pulmonary:     Effort: Pulmonary effort is normal. No respiratory distress.     Breath sounds: Normal breath sounds. No wheezing, rhonchi or rales.  Abdominal:     General: Bowel sounds are normal.     Palpations: Abdomen is soft.     Tenderness: There is no abdominal tenderness. There is no guarding or rebound.     Hernia: No hernia is present.  Skin:    General: Skin is warm and dry.     Findings: No rash.  Neurological:  Mental Status: He is alert.     Deep Tendon Reflexes: Reflexes are normal and symmetric.  Psychiatric:        Speech: Speech normal.        Behavior: Behavior normal.        Judgment: Judgment normal.           Assessment & Plan:

## 2018-02-05 NOTE — Patient Instructions (Signed)
Rest, fluids.  Start cough suppressant at night and antibiotics.  Call if shortness of breath, fever on antibiotics or not improving as expected.

## 2018-05-02 ENCOUNTER — Other Ambulatory Visit: Payer: Self-pay | Admitting: Family Medicine

## 2018-05-03 ENCOUNTER — Other Ambulatory Visit: Payer: Self-pay | Admitting: Family Medicine

## 2018-06-30 ENCOUNTER — Other Ambulatory Visit: Payer: Self-pay | Admitting: Internal Medicine

## 2018-07-01 MED ORDER — CYCLOBENZAPRINE HCL 10 MG PO TABS: 10.0000 mg | ORAL_TABLET | Freq: Three times a day (TID) | ORAL | 0 refills | Status: DC | PRN

## 2018-07-01 NOTE — Telephone Encounter (Signed)
Last office visit 02/05/2018 with Dr. Diona Browner for cough.  Last refilled [phone] for #[id number] with no refills.  No future appointments.

## 2018-08-17 NOTE — Progress Notes (Signed)
Kyle Espinoza T. Iona Stay, MD Primary Care and [place] at [facility] ([facility]) [place], [address] Phone: [phone]  FAX: [place] - 48 y.o. male  MRN [id number]  Date of Birth: 03-22-1970  Visit Date: 08/18/2018  PCP: Owens Loffler, MD  Referred by: Owens Loffler, MD Chief Complaint  Patient presents with  . Cough    constantly clearing throat  . Nasal Congestion   Virtual Visit via Video Note:  I connected with  Kyle Mercer on 08/18/2018  2:40 PM EDT by a video enabled telemedicine application and verified that I am speaking with the correct person using two identifiers.   Location patient: home computer, tablet, or smartphone Location provider: work or home office Consent: Verbal consent directly obtained from [employer]. Persons participating in the virtual visit: patient, provider  I discussed the limitations of evaluation and management by telemedicine and the availability of in person appointments. The patient expressed understanding and agreed to proceed.  History of Present Illness:  Clearing throat and nasal congestion:   Cough, worse.  Worse after eating a meal.  Worse when in the recumbent position.  Sometimes he will choke and throw up, throw up in his mouth or occasionally throw up in general.  Health Maintenance  Topic Date Due  . INFLUENZA VACCINE  09/18/2018  . TETANUS/TDAP  11/23/2022  . HIV Screening  Completed    Immunization History  Administered Date(s) Administered  . Influenza,inj,Quad PF,6+ Mos 11/22/2012, 11/10/2016, 11/11/2017  . Influenza,inj,quad, With Preservative 03/05/2015  . Tdap 11/22/2012     Review of Systems as above: See pertinent positives and pertinent negatives per HPI No acute distress verbally  Past Medical History, Surgical History, Social History, Family History, Problem List, Medications, and Allergies have been reviewed and updated if  relevant.   Observations/Objective/Exam:  An attempt was made to discern vital signs over the phone and per patient if applicable and possible.   General:    Alert, Oriented, appears well and in no acute distress HEENT:     Atraumatic, conjunctiva clear, no obvious abnormalities on inspection of external nose and ears.  Neck:    Normal movements of the head and neck Pulmonary:     On inspection no signs of respiratory distress, breathing rate appears normal, no obvious gross SOB, gasping or wheezing Cardiovascular:    No obvious cyanosis Musculoskeletal:    Moves all visible extremities without noticeable abnormality Psych / Neurological:     Pleasant and cooperative, no obvious depression or anxiety, speech and thought processing grossly intact  Assessment and Plan:    ICD-10-CM   1. Gastroesophageal reflux disease without esophagitis  K21.9   2. Nasal congestion  R09.81   3. Cough  R05    Sounds like a classic GERD cough.  Treat as such with Dexilant.  I discussed the assessment and treatment plan with the patient. The patient was provided an opportunity to ask questions and all were answered. The patient agreed with the plan and demonstrated an understanding of the instructions.   The patient was advised to call back or seek an in-person evaluation if the symptoms worsen or if the condition fails to improve as anticipated.  Follow-up: prn unless noted otherwise below No follow-ups on file.  Meds ordered this encounter  Medications  . dexlansoprazole (DEXILANT) 60 MG capsule    Sig: 1 tablet po 30 minutes before breakfast    Dispense:  30 capsule    Refill:  3   No orders of the defined types were placed in this encounter.   Signed,  Maud Deed. Giovanny Dugal, MD

## 2018-08-18 ENCOUNTER — Encounter: Payer: Self-pay | Admitting: Family Medicine

## 2018-08-18 ENCOUNTER — Ambulatory Visit (INDEPENDENT_AMBULATORY_CARE_PROVIDER_SITE_OTHER): Payer: Managed Care, Other (non HMO) | Admitting: Family Medicine

## 2018-08-18 VITALS — BP 122/77 | HR 72 | Temp 98.1°F | Ht 69.75 in | Wt 227.0 lb

## 2018-08-18 DIAGNOSIS — K219 Gastro-esophageal reflux disease without esophagitis: Secondary | ICD-10-CM

## 2018-08-18 DIAGNOSIS — R05 Cough: Secondary | ICD-10-CM

## 2018-08-18 DIAGNOSIS — R059 Cough, unspecified: Secondary | ICD-10-CM

## 2018-08-18 DIAGNOSIS — R0981 Nasal congestion: Secondary | ICD-10-CM

## 2018-08-18 MED ORDER — DEXLANSOPRAZOLE 60 MG PO CPDR: DELAYED_RELEASE_CAPSULE | ORAL | 3 refills | Status: DC

## 2018-08-19 MED ORDER — PANTOPRAZOLE SODIUM 40 MG PO TBEC: 40.0000 mg | DELAYED_RELEASE_TABLET | Freq: Every day | ORAL | 3 refills | Status: DC

## 2018-10-05 ENCOUNTER — Encounter: Payer: Self-pay | Admitting: Family Medicine

## 2018-10-19 HISTORY — PX: ESOPHAGOGASTRODUODENOSCOPY: SHX1529

## 2018-10-21 ENCOUNTER — Ambulatory Visit (INDEPENDENT_AMBULATORY_CARE_PROVIDER_SITE_OTHER): Payer: Managed Care, Other (non HMO) | Admitting: Internal Medicine

## 2018-10-21 ENCOUNTER — Encounter: Payer: Self-pay | Admitting: Internal Medicine

## 2018-10-21 VITALS — BP 106/74 | HR 80 | Temp 98.7°F | Ht 69.75 in | Wt 227.6 lb

## 2018-10-21 DIAGNOSIS — R05 Cough: Secondary | ICD-10-CM | POA: Diagnosis not present

## 2018-10-21 DIAGNOSIS — R748 Abnormal levels of other serum enzymes: Secondary | ICD-10-CM | POA: Diagnosis not present

## 2018-10-21 DIAGNOSIS — R112 Nausea with vomiting, unspecified: Secondary | ICD-10-CM

## 2018-10-21 DIAGNOSIS — R198 Other specified symptoms and signs involving the digestive system and abdomen: Secondary | ICD-10-CM | POA: Diagnosis not present

## 2018-10-21 DIAGNOSIS — R059 Cough, unspecified: Secondary | ICD-10-CM

## 2018-10-21 NOTE — Progress Notes (Signed)
JUELLZ PICARIELLO 48 y.o. [birth year] [id number] Referred by: Owens Loffler, MD  Assessment & Plan:   Encounter Diagnoses  Name Primary?  . Symptoms of gastroesophageal reflux Yes  . Cough   . Non-intractable vomiting with nausea, unspecified vomiting type   . Abnormal transaminases    Regarding the GERD symptoms, EGD is appropriate since he is not responding to PPI.  Look for a large hiatal hernia other anatomic abnormality signs of inflammation.   We did not discuss the abnormal transaminases today those are better I will discuss that with him at the EGD  I appreciate the opportunity to care for this patient. CC: Copland, Spencer, MD  Subjective:   Chief Complaint: Cough and vomiting  HPI The patient is a 48 year old white man who has been on PPI for several months but despite that he has coughing and regurgitation after eating.  Feels like he has mucus that builds up with postnasal drainage and will have to regurgitate and forcefully vomit at times.  There is no dysphagia.  There are no clear triggers.  If he leans over or bends over the similar thing will happen.  There is associated rhinorrhea as well.  He is never had this before.  Is been going on for several months in 2020.  He has been treated with PPI as mentioned and that has not helped.  It frequently occurs during or just after eating.  He is seen primary care for this and has been appropriately treated with PPI, Dexilant was prescribed but unaffordable due to coverage so he is now on pantoprazole and has been on that for more than a month.  1 caffeinated beverage a day not a smoker no other clear reflux triggers.  No change in medications.  No unintentional weight loss.  All other GI review of systems are negative.  No previous GI work-up He has had abnormal transaminases, AST ALT have been 1-1/2 times elevated over the years they were actually better last year with normal ALT and just slightly elevated AST No problems  with COVID-19, he is working from home no contacts no signs or symptoms consistent with that   Wt Readings from Last 3 Encounters:  10/21/18 227 lb 9.6 oz (103.2 kg)  08/18/18 227 lb (103 kg)  02/05/18 227 lb (103 kg)    Allergies  Allergen Reactions  . Cefdinir     REACTION: cramping in stomach, vomiting and diarrhea   Current Meds  Medication Sig  . cyclobenzaprine (FLEXERIL) 10 MG tablet Take 1 tablet (10 mg total) by mouth 3 (three) times daily as needed for muscle spasms.  . fluticasone (FLONASE) 50 MCG/ACT nasal spray PLACE 2 SPRAYS INTO THE NOSE ONCE DAILY.  . naproxen sodium (ALEVE) 220 MG tablet Take 220 mg by mouth daily as needed.  . NON FORMULARY C-PAP machine, use at night   . pantoprazole (PROTONIX) 40 MG tablet Take 1 tablet (40 mg total) by mouth daily.  . sertraline (ZOLOFT) 50 MG tablet TAKE 1 TABLET BY MOUTH EVERY DAY   Past Medical History:  Diagnosis Date  . Allergic rhinitis   . Anxiety   . Chronic back pain   . Chronic neck pain   . Depression   . GERD (gastroesophageal reflux disease)   . Headache(784.0)   . History of nephrolithiasis   . Hyperlipidemia   . Sleep apnea    Past Surgical History:  Procedure Laterality Date  . 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  No drug use.   regular exercise- no   family history includes Diabetes in his brother, father, and mother; Heart disease in his father and mother.   Review of Systems See HPI, he does had some myalgias back pain cough as mentioned in the HPI fatigue and allergy symptoms all other review systems are negative  Objective:   Physical Exam @BP  106/74   Pulse  80   Temp 98.7 F (37.1 C)   Ht 5' 9.75" (1.772 m)   Wt 227 lb 9.6 oz (103.2 kg)   BMI 32.89 kg/m @  General:  Well-developed, well-nourished and in no acute distress Eyes:  anicteric. ENT:   Mouth and posterior pharynx free of lesions.  Neck:   supple w/o thyromegaly or mass.  Lungs: Clear to auscultation bilaterally. Heart:  S1S2, no rubs, murmurs, gallops. Abdomen:  soft, non-tender, no hepatosplenomegaly, hernia, or mass and BS+.  Lymph:  no cervical or supraclavicular adenopathy. Extremities:   no edema, cyanosis or clubbing Skin   no rash. Neuro:  A&O x 3.  Psych:  appropriate mood and  Affect.   Data Reviewed:  Primary care notes labs in the EMR

## 2018-10-21 NOTE — Patient Instructions (Addendum)
You have been scheduled for an endoscopy. Please follow written instructions given to you at your visit today. If you use inhalers (even only as needed), please bring them with you on the day of your procedure. Your physician has requested that you go to www.startemmi.com and enter the access code given to you at your visit today. This web site gives a general overview about your procedure. However, you should still follow specific instructions given to you by our office regarding your preparation for the procedure.  If you are age 67 or younger, your body mass index should be between 19-25. Your Body mass index is 32.89 kg/m. If this is out of the aformentioned range listed, please consider follow up with your Primary Care Provider.    I appreciate the opportunity to care for you. Silvano Rusk, MD, [facility]

## 2018-10-22 ENCOUNTER — Encounter: Payer: Self-pay | Admitting: Internal Medicine

## 2018-10-26 ENCOUNTER — Telehealth: Payer: Self-pay

## 2018-10-26 NOTE — Telephone Encounter (Signed)
Covid-19 screening questions   Do you now or have you had a fever in the last 14 days?  Do you have any respiratory symptoms of shortness of breath or cough now or in the last 14 days?  Do you have any family members or close contacts with diagnosed or suspected Covid-19 in the past 14 days?  Have you been tested for Covid-19 and found to be positive?       

## 2018-10-26 NOTE — Telephone Encounter (Signed)
Pt responded "no" to all screening questions °

## 2018-10-27 ENCOUNTER — Ambulatory Visit (AMBULATORY_SURGERY_CENTER): Payer: Managed Care, Other (non HMO) | Admitting: Internal Medicine

## 2018-10-27 ENCOUNTER — Encounter: Payer: Self-pay | Admitting: Internal Medicine

## 2018-10-27 ENCOUNTER — Other Ambulatory Visit: Payer: Self-pay

## 2018-10-27 VITALS — BP 122/77 | HR 72 | Temp 98.1°F | Resp 18 | Ht 69.75 in | Wt 227.0 lb

## 2018-10-27 DIAGNOSIS — R198 Other specified symptoms and signs involving the digestive system and abdomen: Secondary | ICD-10-CM

## 2018-10-27 DIAGNOSIS — K2970 Gastritis, unspecified, without bleeding: Secondary | ICD-10-CM

## 2018-10-27 DIAGNOSIS — K210 Gastro-esophageal reflux disease with esophagitis: Secondary | ICD-10-CM | POA: Diagnosis not present

## 2018-10-27 DIAGNOSIS — K317 Polyp of stomach and duodenum: Secondary | ICD-10-CM | POA: Diagnosis not present

## 2018-10-27 DIAGNOSIS — K259 Gastric ulcer, unspecified as acute or chronic, without hemorrhage or perforation: Secondary | ICD-10-CM | POA: Diagnosis not present

## 2018-10-27 DIAGNOSIS — K3189 Other diseases of stomach and duodenum: Secondary | ICD-10-CM | POA: Diagnosis not present

## 2018-10-27 MED ORDER — SODIUM CHLORIDE 0.9 % IV SOLN: 500.0000 mL | Freq: Once | INTRAVENOUS | Status: DC

## 2018-10-27 NOTE — Progress Notes (Signed)
Pt's states no medical or surgical changes since previsit or office visit.  Temp J[place] VS C. [place]

## 2018-10-27 NOTE — Patient Instructions (Addendum)
A few findings - took biopsies.  Bottom of stomach inflamed - possibly from naproxen.  Small stomach polyps - usually not an issue.  ? Of esophageal lining changes called eosinophilic esophagitis.  Once I get the results I will call and regroup with you.  I appreciate the opportunity to care for you. Gatha Mayer, MD, FACG     YOU HAD AN ENDOSCOPIC PROCEDURE TODAY AT [facility]:   Refer to the procedure report that was given to you for any specific questions about what was found during the examination.  If the procedure report does not answer your questions, please call your gastroenterologist to clarify.  If you requested that your care partner not be given the details of your procedure findings, then the procedure report has been included in a sealed envelope for you to review at your convenience later.  YOU SHOULD EXPECT: Some feelings of bloating in the abdomen. Passage of more gas than usual.  Walking can help get rid of the air that was put into your GI tract during the procedure and reduce the bloating. If you had a lower endoscopy (such as a colonoscopy or flexible sigmoidoscopy) you may notice spotting of blood in your stool or on the toilet paper. If you underwent a bowel prep for your procedure, you may not have a normal bowel movement for a few days.  Please Note:  You might notice some irritation and congestion in your nose or some drainage.  This is from the oxygen used during your procedure.  There is no need for concern and it should clear up in a day or so.  SYMPTOMS TO REPORT IMMEDIATELY:     Following upper endoscopy (EGD)  Vomiting of blood or coffee ground material  New chest pain or pain under the shoulder blades  Painful or persistently difficult swallowing  New shortness of breath  Fever of 100F or higher  Black, tarry-looking stools  For urgent or emergent issues, a gastroenterologist can be reached at any hour by calling [phone]  [phone].   DIET:  We do recommend a small meal at first, but then you may proceed to your regular diet.  Drink plenty of fluids but you should avoid alcoholic beverages for 24 hours.  ACTIVITY:  You should plan to take it easy for the rest of today and you should NOT DRIVE or use heavy machinery until tomorrow (because of the sedation medicines used during the test).    FOLLOW UP: Our staff will call the number listed on your records 48-72 hours following your procedure to check on you and address any questions or concerns that you may have regarding the information given to you following your procedure. If we do not reach you, we will leave a message.  We will attempt to reach you two times.  During this call, we will ask if you have developed any symptoms of COVID 19. If you develop any symptoms (ie: fever, flu-like symptoms, shortness of breath, cough etc.) before then, please call [phone].  If you test positive for Covid 19 in the 2 weeks post procedure, please call and report this information to [place].    If any biopsies were taken you will be contacted by phone or by letter within the next 1-3 weeks.  Please call us at [phone] if you have not heard about the biopsies in 3 weeks.    SIGNATURES/CONFIDENTIALITY: You and/or your care partner have signed paperwork which will be entered into  your electronic medical record.  These signatures attest to the fact that that the information above on your After Visit Summary has been reviewed and is understood.  Full responsibility of the confidentiality of this discharge information lies with you and/or your care-partner.    Handout was given to you on Gastritis. You may resume your current medications today. Await biopsy results. Please call if any questions or concerns.

## 2018-10-27 NOTE — Progress Notes (Signed)
No problems noted in the recovery room. maw 

## 2018-10-27 NOTE — Progress Notes (Signed)
Report to PACU, RN, vss, BBS= Clear.  

## 2018-10-27 NOTE — Op Note (Signed)
[facility] Patient Name: Kyle Mercer Procedure Date: 10/27/2018 7:17 AM MRN: [id number] Endoscopist: Gatha Mayer , MD Age: 48 Referring MD:  Date of Birth: [birth year] Gender: Male Account #: [id number] Procedure:                Upper GI endoscopy Indications:              Heartburn, Esophageal reflux symptoms that persist                            despite appropriate therapy Medicines:                Propofol per Anesthesia, Monitored Anesthesia Care Procedure:                Pre-Anesthesia Assessment:                           - Prior to the procedure, a History and Physical                            was performed, and patient medications and                            allergies were reviewed. The patient's tolerance of                            previous anesthesia was also reviewed. The risks                            and benefits of the procedure and the sedation                            options and risks were discussed with the patient.                            All questions were answered, and informed consent                            was obtained. Prior Anticoagulants: The patient has                            taken no previous anticoagulant or antiplatelet                            agents. ASA Grade Assessment: II - A patient with                            mild systemic disease. After reviewing the risks                            and benefits, the patient was deemed in                            satisfactory condition to undergo the procedure.  After obtaining informed consent, the endoscope was                            passed under direct vision. Throughout the                            procedure, the patient's blood pressure, pulse, and                            oxygen saturations were monitored continuously. The                            Endoscope was introduced through the mouth, and                            advanced  to the second part of duodenum. The upper                            GI endoscopy was accomplished without difficulty.                            The patient tolerated the procedure well. Scope In: Scope Out: Findings:                 Mucosal changes including feline appearance and                            longitudinal markings were found in the entire                            esophagus. Biopsies were obtained from the proximal                            and distal esophagus with cold forceps for                            histology of suspected eosinophilic esophagitis.                            Verification of patient identification for the                            specimen was done. Estimated blood loss was minimal.                           Multiple diminutive sessile polyps with no stigmata                            of recent bleeding were found in the gastric body.                            Biopsies were taken with a cold forceps for  histology. Verification of patient identification                            for the specimen was done. Estimated blood loss was                            minimal.                           Moderate inflammation characterized by erosions,                            erythema and granularity was found in the                            prepyloric region of the stomach. Biopsies were                            taken with a cold forceps for histology.                            Verification of patient identification for the                            specimen was done. Estimated blood loss was minimal.                           The exam was otherwise without abnormality.                           The cardia and gastric fundus were normal on                            retroflexion. Complications:            No immediate complications. Estimated Blood Loss:     Estimated blood loss was minimal. Impression:               -  Esophageal mucosal changes suspicious for                            eosinophilic esophagitis. Biopsied.                           - Multiple gastric polyps. Biopsied.                           - Gastritis. Biopsied.                           - The examination was otherwise normal. Recommendation:           - Patient has a contact number available for                            emergencies. The signs and symptoms of potential  delayed complications were discussed with the                            patient. Return to normal activities tomorrow.                            Written discharge instructions were provided to the                            patient.                           - Resume previous diet.                           - Continue present medications.                           - Await pathology results.                           - Consdier gastric emptying, ? dc naproxen Gatha Mayer, MD 10/27/2018 8:22:48 AM This report has been signed electronically.

## 2018-10-29 ENCOUNTER — Telehealth: Payer: Self-pay | Admitting: *Deleted

## 2018-10-29 ENCOUNTER — Telehealth: Payer: Self-pay

## 2018-10-29 NOTE — Telephone Encounter (Signed)
1. Have you developed a fever since your procedure? no  2.   Have you had an respiratory symptoms (SOB or cough) since your procedure? no  3.   Have you tested positive for COVID 19 since your procedure no  4.   Have you had any family members/close contacts diagnosed with the COVID 19 since your procedure?  no   If yes to any of these questions please route to Joylene John, RN and Alphonsa Gin, Therapist, sports.     Follow up Call-  Call back number 10/27/2018  Post procedure Call Back phone  # [phone]  Permission to leave phone message Yes  Some recent data might be hidden     Patient questions:  Do you have a fever, pain , or abdominal swelling? C/o burning in his stomach Pain Score  0 *  Have you tolerated food without any problems? Yes.    Have you been able to return to your normal activities? Yes.    Do you have any questions about your discharge instructions: Diet   No. Medications  No. Follow up visit  No.  Do you have questions or concerns about your Care? Yes.     Pt called back saying he was having "burning in his stomach " post procedure. This is different from what he was having before the procedure. The burning is less then yesterday. I told the patient I would reach out to Dr. Carlean Purl and that we were waiting on biopsy results. Actions: * If pain score is 4 or above: No action needed, pain <4.

## 2018-10-29 NOTE — Telephone Encounter (Signed)
Give it time Once I see biopsy results will let him know and f/u

## 2018-10-29 NOTE — Telephone Encounter (Signed)
First post procedure follow up call, no answer 

## 2018-10-29 NOTE — Telephone Encounter (Signed)
Second attempt follow up to pt, left message for pt to call if any questions, problems or test positive for covid 19

## 2018-11-03 NOTE — Progress Notes (Signed)
No letter/recall from Kyle Mercer him biopsies show inflammation but doesn't tell me ehy he has his problems  Needs for dx nausea and vomiting, abnl LFT's  1) CBC, CMET, amylase and lipase 2) Complete abdominal ultrasound  Will call/My Chart message after those results are in

## 2018-11-04 ENCOUNTER — Other Ambulatory Visit: Payer: Self-pay

## 2018-11-04 DIAGNOSIS — R748 Abnormal levels of other serum enzymes: Secondary | ICD-10-CM

## 2018-11-04 DIAGNOSIS — R112 Nausea with vomiting, unspecified: Secondary | ICD-10-CM

## 2018-11-12 ENCOUNTER — Ambulatory Visit
Admission: RE | Admit: 2018-11-12 | Discharge: 2018-11-12 | Disposition: A | Discharge status: Home / Self Care | Payer: Managed Care, Other (non HMO) | Source: Ambulatory Visit | Attending: Internal Medicine | Admitting: Internal Medicine

## 2018-11-12 ENCOUNTER — Other Ambulatory Visit: Payer: Self-pay

## 2018-11-12 ENCOUNTER — Ambulatory Visit (HOSPITAL_COMMUNITY): Payer: Managed Care, Other (non HMO)

## 2018-11-12 DIAGNOSIS — R112 Nausea with vomiting, unspecified: Secondary | ICD-10-CM | POA: Diagnosis not present

## 2018-11-12 DIAGNOSIS — R748 Abnormal levels of other serum enzymes: Secondary | ICD-10-CM | POA: Diagnosis present

## 2018-11-17 NOTE — Progress Notes (Signed)
Please call him:  1) US shows changes that could be inflammation/infection or fatty liver which can be intrinsic or come from alcohol, also has an enlarged spleen. I think he may have had some sort of virus. 2) He still needs to do the CBC, CMET, amylase and lipase that he was going to do at [facility] - please ask him to do these and then will review and update

## 2018-11-29 ENCOUNTER — Other Ambulatory Visit (INDEPENDENT_AMBULATORY_CARE_PROVIDER_SITE_OTHER): Payer: Managed Care, Other (non HMO)

## 2018-11-29 DIAGNOSIS — R748 Abnormal levels of other serum enzymes: Secondary | ICD-10-CM | POA: Diagnosis not present

## 2018-11-29 DIAGNOSIS — R112 Nausea with vomiting, unspecified: Secondary | ICD-10-CM

## 2018-11-30 LAB — CBC
HCT: 45.3 % (ref 39.0–52.0)
Hemoglobin: 15.6 g/dL (ref 13.0–17.0)
MCHC: 34.4 g/dL (ref 30.0–36.0)
MCV: 100.3 fl — ABNORMAL HIGH (ref 78.0–100.0)
Platelets: 115 10*3/uL — ABNORMAL LOW (ref 150.0–400.0)
RBC: 4.52 Mil/uL (ref 4.22–5.81)
RDW: 13.3 % (ref 11.5–15.5)
WBC: 6.1 10*3/uL (ref 4.0–10.5)

## 2018-11-30 LAB — COMPREHENSIVE METABOLIC PANEL
ALT: 48 U/L (ref 0–53)
AST: 95 U/L — ABNORMAL HIGH (ref 0–37)
Albumin: 3.9 g/dL (ref 3.5–5.2)
Alkaline Phosphatase: 87 U/L (ref 39–117)
BUN: 9 mg/dL (ref 6–23)
CO2: 30 mEq/L (ref 19–32)
Calcium: 9 mg/dL (ref 8.4–10.5)
Chloride: 101 mEq/L (ref 96–112)
Creatinine, Ser: 0.83 mg/dL (ref 0.40–1.50)
GFR: 98.67 mL/min (ref >60.00)
Glucose, Bld: 120 mg/dL — ABNORMAL HIGH (ref 70–99)
Potassium: 3.9 mEq/L (ref 3.5–5.1)
Sodium: 138 mEq/L (ref 135–145)
Total Bilirubin: 1 mg/dL (ref 0.2–1.2)
Total Protein: 7.1 g/dL (ref 6.0–8.3)

## 2018-11-30 LAB — LIPASE: Lipase: 85 U/L — ABNORMAL HIGH (ref 11.0–59.0)

## 2018-11-30 LAB — AMYLASE: Amylase: 51 U/L (ref 27–131)

## 2018-12-06 NOTE — Progress Notes (Signed)
Kyle Mercer,  The labs show an abnorma  liver tests  and  pancreas test.  This is likely from excessive alcohol intake. I recommend that you greatly reduce and likely need  to stop alcohol intake completely (at least for a time). I have significant concern that your alcohol intake is harming your liver and pancreas and could result in serious harm.  Please contact my office and make a follow-up appointment.  I appreciate the opportunity to care for you. Gatha Mayer, MD, [facility]

## 2018-12-21 ENCOUNTER — Telehealth: Payer: Self-pay

## 2018-12-21 DIAGNOSIS — R748 Abnormal levels of other serum enzymes: Secondary | ICD-10-CM

## 2018-12-21 DIAGNOSIS — R634 Abnormal weight loss: Secondary | ICD-10-CM

## 2018-12-21 DIAGNOSIS — R112 Nausea with vomiting, unspecified: Secondary | ICD-10-CM

## 2018-12-21 NOTE — Telephone Encounter (Signed)
Patient has been scheduled for 12/30/18 3:00. He will come pick up the instructions and contrast prior to CT.

## 2018-12-21 NOTE — Telephone Encounter (Signed)
-----   Message from Gatha Mayer, MD sent at 12/20/2018  7:29 PM EST ----- Regarding: CT and labs Please arrange the following   CT abd/pelvis w/ contrast - dx: weight loss, elevated lipase, nausea and vomiting   LFT's dx: abnormal transaminases

## 2018-12-24 ENCOUNTER — Other Ambulatory Visit (INDEPENDENT_AMBULATORY_CARE_PROVIDER_SITE_OTHER): Payer: Managed Care, Other (non HMO)

## 2018-12-24 DIAGNOSIS — R748 Abnormal levels of other serum enzymes: Secondary | ICD-10-CM

## 2018-12-24 LAB — HEPATIC FUNCTION PANEL
ALT: 51 U/L (ref 0–53)
AST: 63 U/L — ABNORMAL HIGH (ref 0–37)
Albumin: 4.1 g/dL (ref 3.5–5.2)
Alkaline Phosphatase: 74 U/L (ref 39–117)
Bilirubin, Direct: 0.2 mg/dL (ref 0.0–0.3)
Total Bilirubin: 1 mg/dL (ref 0.2–1.2)
Total Protein: 7.5 g/dL (ref 6.0–8.3)

## 2018-12-27 NOTE — Progress Notes (Signed)
Liver tests are better but not yet normal.  Will communicate more after CT completed.  Gatha Mayer, MD, [facility]

## 2018-12-30 ENCOUNTER — Ambulatory Visit (INDEPENDENT_AMBULATORY_CARE_PROVIDER_SITE_OTHER)
Admission: RE | Admit: 2018-12-30 | Discharge: 2018-12-30 | Disposition: A | Discharge status: Home / Self Care | Payer: Managed Care, Other (non HMO) | Source: Ambulatory Visit | Attending: Internal Medicine | Admitting: Internal Medicine

## 2018-12-30 ENCOUNTER — Other Ambulatory Visit: Payer: Self-pay

## 2018-12-30 DIAGNOSIS — R634 Abnormal weight loss: Secondary | ICD-10-CM

## 2018-12-30 DIAGNOSIS — R112 Nausea with vomiting, unspecified: Secondary | ICD-10-CM

## 2018-12-30 DIAGNOSIS — R748 Abnormal levels of other serum enzymes: Secondary | ICD-10-CM

## 2018-12-30 MED ORDER — IOHEXOL 300 MG/ML  SOLN
100.0000 mL | Freq: Once | INTRAMUSCULAR | Status: AC | PRN
  Administered 2018-12-30: 100 mL via INTRAVENOUS

## 2018-12-31 ENCOUNTER — Other Ambulatory Visit: Payer: Self-pay

## 2018-12-31 DIAGNOSIS — K769 Liver disease, unspecified: Secondary | ICD-10-CM

## 2018-12-31 NOTE — Progress Notes (Signed)
I called the patient and explained the results.  He needs MR liver with and without contrast to evaluate right liver lesion

## 2019-01-05 ENCOUNTER — Other Ambulatory Visit: Payer: Self-pay

## 2019-01-05 ENCOUNTER — Ambulatory Visit (HOSPITAL_COMMUNITY)
Admission: RE | Admit: 2019-01-05 | Discharge: 2019-01-05 | Disposition: A | Discharge status: Home / Self Care | Payer: Managed Care, Other (non HMO) | Source: Ambulatory Visit | Attending: Internal Medicine | Admitting: Internal Medicine

## 2019-01-05 DIAGNOSIS — K769 Liver disease, unspecified: Secondary | ICD-10-CM

## 2019-01-05 MED ORDER — GADOBUTROL 1 MMOL/ML IV SOLN
10.0000 mL | Freq: Once | INTRAVENOUS | Status: AC | PRN
  Administered 2019-01-05: 14:00:00 10 mL via INTRAVENOUS

## 2019-01-05 NOTE — Progress Notes (Signed)
Daaron,  Good news here - the ? Of a lesion in liver was not so - it was changes in the amount of fat in the liver that created that effect. No signs of cancer. No definite cirrhosis but there are signs of liver scarring and possible cirrhosis.  Will explain more 12/8 but let me know any ? Now  Gatha Mayer, MD, [facility]

## 2019-01-06 ENCOUNTER — Encounter: Payer: Self-pay | Admitting: Internal Medicine

## 2019-01-06 ENCOUNTER — Telehealth: Payer: Self-pay

## 2019-01-06 NOTE — Telephone Encounter (Signed)
Called patient due to addendum on MR recommending repeat MR 3-6 mos due to area of differential enhancement  Will review and order LFT's and al;so consider other serologies before 12/4 visit

## 2019-01-06 NOTE — Telephone Encounter (Signed)
Received call report from radiology, see addendum to MR report.

## 2019-01-25 ENCOUNTER — Telehealth: Payer: Self-pay

## 2019-01-25 ENCOUNTER — Ambulatory Visit: Payer: Managed Care, Other (non HMO) | Admitting: Internal Medicine

## 2019-01-25 NOTE — Telephone Encounter (Signed)
-----   Message from Kyle Mercer sent at 01/25/2019  9:23 AM EST ----- This is the patient that is scheduled for this afternoon with Dr. Carlean Purl. He does not want to wait until January to be seen. He only wants to see Dr. Carlean Purl.  Thanks  [facility]

## 2019-01-25 NOTE — Telephone Encounter (Signed)
Spoke with pt and he states he has not had any calls from our office regarding rescheduling his appt and he was not happy about the situation. Pt rescheduled to see Dr. Carlean Purl 02/03/19@11 :30am, he is aware of new appt.

## 2019-02-03 ENCOUNTER — Ambulatory Visit (INDEPENDENT_AMBULATORY_CARE_PROVIDER_SITE_OTHER): Payer: Managed Care, Other (non HMO) | Admitting: Internal Medicine

## 2019-02-03 ENCOUNTER — Other Ambulatory Visit (INDEPENDENT_AMBULATORY_CARE_PROVIDER_SITE_OTHER): Payer: Managed Care, Other (non HMO)

## 2019-02-03 ENCOUNTER — Encounter: Payer: Self-pay | Admitting: Internal Medicine

## 2019-02-03 VITALS — BP 112/64 | HR 86 | Temp 98.3°F | Ht 70.0 in | Wt 216.0 lb

## 2019-02-03 DIAGNOSIS — R748 Abnormal levels of other serum enzymes: Secondary | ICD-10-CM

## 2019-02-03 DIAGNOSIS — D696 Thrombocytopenia, unspecified: Secondary | ICD-10-CM | POA: Insufficient documentation

## 2019-02-03 DIAGNOSIS — K769 Liver disease, unspecified: Secondary | ICD-10-CM | POA: Diagnosis not present

## 2019-02-03 DIAGNOSIS — R0982 Postnasal drip: Secondary | ICD-10-CM | POA: Diagnosis not present

## 2019-02-03 HISTORY — DX: Thrombocytopenia, unspecified: D69.6

## 2019-02-03 HISTORY — DX: Abnormal levels of other serum enzymes: R74.8

## 2019-02-03 LAB — COMPREHENSIVE METABOLIC PANEL
ALT: 41 U/L (ref 0–53)
AST: 52 U/L — ABNORMAL HIGH (ref 0–37)
Albumin: 4.3 g/dL (ref 3.5–5.2)
Alkaline Phosphatase: 70 U/L (ref 39–117)
BUN: 8 mg/dL (ref 6–23)
CO2: 28 mEq/L (ref 19–32)
Calcium: 9.6 mg/dL (ref 8.4–10.5)
Chloride: 103 mEq/L (ref 96–112)
Creatinine, Ser: 0.9 mg/dL (ref 0.40–1.50)
GFR: 89.8 mL/min (ref >60.00)
Glucose, Bld: 91 mg/dL (ref 70–99)
Potassium: 4.3 mEq/L (ref 3.5–5.1)
Sodium: 137 mEq/L (ref 135–145)
Total Bilirubin: 0.6 mg/dL (ref 0.2–1.2)
Total Protein: 7.7 g/dL (ref 6.0–8.3)

## 2019-02-03 LAB — CBC WITH DIFFERENTIAL/PLATELET
Basophils Absolute: 0.1 10*3/uL (ref 0.0–0.1)
Basophils Relative: 1.2 % (ref 0.0–3.0)
Eosinophils Absolute: 0.1 10*3/uL (ref 0.0–0.7)
Eosinophils Relative: 2.4 % (ref 0.0–5.0)
HCT: 45.4 % (ref 39.0–52.0)
Hemoglobin: 15.7 g/dL (ref 13.0–17.0)
Lymphocytes Relative: 32.8 % (ref 12.0–46.0)
Lymphs Abs: 1.7 10*3/uL (ref 0.7–4.0)
MCHC: 34.6 g/dL (ref 30.0–36.0)
MCV: 96.1 fl (ref 78.0–100.0)
Monocytes Absolute: 0.5 10*3/uL (ref 0.1–1.0)
Monocytes Relative: 10.5 % (ref 3.0–12.0)
Neutro Abs: 2.7 10*3/uL (ref 1.4–7.7)
Neutrophils Relative %: 53.1 % (ref 43.0–77.0)
Platelets: 121 10*3/uL — ABNORMAL LOW (ref 150.0–400.0)
RBC: 4.72 Mil/uL (ref 4.22–5.81)
RDW: 13 % (ref 11.5–15.5)
WBC: 5.1 10*3/uL (ref 4.0–10.5)

## 2019-02-03 LAB — PROTIME-INR
INR: 1.2 ratio — ABNORMAL HIGH (ref 0.8–1.0)
Prothrombin Time: 14 s — ABNORMAL HIGH (ref 9.6–13.1)

## 2019-02-03 LAB — FERRITIN: Ferritin: 390.6 ng/mL — ABNORMAL HIGH (ref 22.0–322.0)

## 2019-02-03 NOTE — Progress Notes (Signed)
Kyle Mercer 48 y.o. [birth year] [id number]  Assessment & Plan:  Abnormal transaminases question cirrhosis, suspect alcohol as cause I suspect he has alcoholic liver disease, that hopefully will improve and any inflammation or fibrosis may remodel and resolve.  I am going to go ahead with an ANA and a ferritin level repeat LFTs CBC and an INR and alpha-fetoprotein today. He will continue abstinence from alcohol.  He needs imaging follow-up as outlined in the other assessments, question of a liver lesion. Follow-up in the office to be determined pending lab results and imaging results.  Cirrhosis handout provided to the patient today.  Thrombocytopenia ([place]) Presumably related to portal hypertension.  Liver lesion hepatic subsegment four? Follow-up MRI in March 2021  Post-nasal drainage Not so much of a problem, I think this was driving his nausea vomiting gagging etc. and question of reflux.  He is off PPI and okay.  I appreciate the opportunity to care for this patient. CC: Copland, Frederico Hamman, MD     Subjective:   Chief Complaint: Abnormal liver tests question cirrhosis  HPI Kyle Mercer is here for follow-up, I had originally seen him in September when he was having nausea vomiting and gagging problems thought perhaps to be reflux, he was having a lot of postnasal drainage sedan as well.  He had some gastritis on EGD but no other findings he has stopped his PPI the postnasal drainage has stopped and he is not really having those problems.  He had some abnormal transaminases and imaging work-up was done as outlined below.  EGD September 9 Esophageal mucosal changes suspicious for eosinophilic esophagitis. Biopsied. - Multiple gastric polyps. Biopsied. - Gastritis. Biopsied. - The examination was otherwise normal.  1. Surgical [P], gastric antrum - GASTRIC ANTRAL MUCOSA SHOWING MARKED NONSPECIFIC REACTIVE GASTROPATHY WITH EROSION - WARTHIN STARRY STAIN IS NEGATIVE FOR  HELICOBACTER PYLORI 2. Surgical [P], gastric body polyp - GASTRIC OXYNTIC MUCOSA WITH NO SPECIFIC HISTOPATHOLOGIC CHANGES - FUNDIC GLAND POLYP - WARTHIN STARRY STAIN IS NEGATIVE FOR HELICOBACTER PYLORI 3. Surgical [P], esophageal - ESOPHAGEAL SQUAMOUS MUCOSA WITH MILD VASCULAR CONGESTION, AND FOCAL SQUAMOUS BALLOONING, SUGGESTIVE OF REFLUX ESOPHAGITIS - NEGATIVE FOR INCREASED INTRAEPITHELIAL EOSINOPHILS - PAS/F STAIN IS NEGATIVE FOR FUNGAL ORGANISMS   Ultrasound in September demonstrated increased hepatic parenchymal echogenicity and mild to moderate splenomegaly  CT scan in November on the 12th suggested cirrhosis and steatosis with mild splenomegaly portal hypertension and heterogeneous area in the inferior right hepatic lobe such as could be a lesion like a small hepatocellular carcinoma as well as some varices.  MRI on 01/05/2019 showed areas of fatty sparing mild splenomegaly and perigastric varices.  A subsequent follow-up exam showed a hepatic subsegment for area of presumed fatty sparing with question of differential enhancement which raises concern for possible lesion and a 3 to 5-month follow-up MRI was suggested and there was some changes in the right hepatic lobe thought related to venous shunting.   Since then he has been off alcohol his transaminases were much better  Lab Results  Component Value Date   ALT 51 12/24/2018   AST 63 (H) 12/24/2018   ALKPHOS 74 12/24/2018   BILITOT 1.0 12/24/2018    He is here for follow-up today.  We have reviewed over my chart in the phone that its not really clear if he has cirrhosis though there are some features suggestive of that.  Liver function seems to be good. Allergies  Allergen Reactions  . Cefdinir     REACTION: cramping  in stomach, vomiting and diarrhea   Current Meds  Medication Sig  . cyclobenzaprine (FLEXERIL) 10 MG tablet Take 1 tablet (10 mg total) by mouth 3 (three) times daily as needed for muscle spasms.  .  fluticasone (FLONASE) 50 MCG/ACT nasal spray PLACE 2 SPRAYS INTO THE NOSE ONCE DAILY.  . naproxen sodium (ALEVE) 220 MG tablet Take 220 mg by mouth daily as needed.  . NON FORMULARY C-PAP machine, use at night   . sertraline (ZOLOFT) 50 MG tablet TAKE 1 TABLET BY MOUTH EVERY DAY   Past Medical History:  Diagnosis Date  . Abnormal transaminases question cirrhosis, suspect alcohol as cause 02/03/2019  . Allergic rhinitis   . Anxiety   . Chronic back pain   . Chronic neck pain   . GERD (gastroesophageal reflux disease)   . Headache(784.0)   . History of nephrolithiasis   . Hyperlipidemia   . Sleep apnea   . Thrombocytopenia ([place]) 02/03/2019   Past Surgical History:  Procedure Laterality Date  . 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  No drug use.   regular exercise- no   family history includes Diabetes in his brother, father, and mother; Heart disease in his father and mother.   Review of Systems As above  Objective:   Physical Exam  @BP  112/64   Pulse 86   Temp 98.3 F (36.8 C) (Oral)   Ht 5\' 10"  (1.778 m)   Wt 216 lb (98 kg)   BMI 30.99 kg/m @  General:  NAD Eyes:   anicteric Lungs:  clear Heart::  S1S2 no rubs, murmurs or gallops Abdomen:  soft and nontender, BS+, moderate Lee obese Ext:   no edema, cyanosis or clubbing No stigmata of chronic liver disease identified on skin exam or other body parts    Data Reviewed:  See HPI

## 2019-02-03 NOTE — Assessment & Plan Note (Signed)
Presumably related to portal hypertension.

## 2019-02-03 NOTE — Assessment & Plan Note (Signed)
Follow-up MRI in March 2021

## 2019-02-03 NOTE — Assessment & Plan Note (Addendum)
I suspect he has alcoholic liver disease, that hopefully will improve and any inflammation or fibrosis may remodel and resolve.  I am going to go ahead with an ANA and a ferritin level repeat LFTs CBC and an INR and alpha-fetoprotein today. He will continue abstinence from alcohol.  He needs imaging follow-up as outlined in the other assessments, question of a liver lesion. Follow-up in the office to be determined pending lab results and imaging results.  Cirrhosis handout provided to the patient today.

## 2019-02-03 NOTE — Patient Instructions (Signed)
Your provider has requested that you go to the basement level for lab work before leaving today. Press "B" on the elevator. The lab is located at the first door on the left as you exit the elevator.   We will put in a recall for you to get a MRI in March 2021.    I appreciate the opportunity to care for you. Silvano Rusk, MD, [facility]

## 2019-02-03 NOTE — Assessment & Plan Note (Signed)
Not so much of a problem, I think this was driving his nausea vomiting gagging etc. and question of reflux.  He is off PPI and okay.

## 2019-02-05 ENCOUNTER — Other Ambulatory Visit: Payer: Self-pay | Admitting: Family Medicine

## 2019-02-06 LAB — HEPATITIS B SURFACE ANTIBODY,QUALITATIVE: Hep B S Ab: NONREACTIVE

## 2019-02-06 LAB — HEPATITIS B CORE ANTIBODY, TOTAL: Hep B Core Total Ab: NONREACTIVE

## 2019-02-06 LAB — HEPATITIS C ANTIBODY
Hepatitis C Ab: NONREACTIVE
SIGNAL TO CUT-OFF: 0.03 (ref <1.00)

## 2019-02-06 LAB — ANA: Anti Nuclear Antibody (ANA): POSITIVE — AB

## 2019-02-06 LAB — HEPATITIS A ANTIBODY, TOTAL: Hepatitis A AB,Total: BORDERLINE — AB

## 2019-02-06 LAB — HEPATITIS B SURFACE ANTIGEN: Hepatitis B Surface Ag: NONREACTIVE

## 2019-02-06 LAB — ANTI-NUCLEAR AB-TITER (ANA TITER): ANA Titer 1: 1:40 {titer} — ABNORMAL HIGH

## 2019-02-06 LAB — AFP TUMOR MARKER: AFP-Tumor Marker: 14.2 ng/mL — ABNORMAL HIGH (ref <6.1)

## 2019-02-15 ENCOUNTER — Other Ambulatory Visit: Payer: Self-pay

## 2019-02-15 DIAGNOSIS — K769 Liver disease, unspecified: Secondary | ICD-10-CM

## 2019-02-23 ENCOUNTER — Ambulatory Visit (INDEPENDENT_AMBULATORY_CARE_PROVIDER_SITE_OTHER): Payer: Managed Care, Other (non HMO) | Admitting: Internal Medicine

## 2019-02-23 DIAGNOSIS — Z23 Encounter for immunization: Secondary | ICD-10-CM | POA: Diagnosis not present

## 2019-03-28 ENCOUNTER — Ambulatory Visit (INDEPENDENT_AMBULATORY_CARE_PROVIDER_SITE_OTHER): Payer: Managed Care, Other (non HMO) | Admitting: Internal Medicine

## 2019-03-28 DIAGNOSIS — Z23 Encounter for immunization: Secondary | ICD-10-CM

## 2019-04-12 ENCOUNTER — Other Ambulatory Visit: Payer: Self-pay | Admitting: *Deleted

## 2019-04-12 ENCOUNTER — Telehealth: Payer: Self-pay | Admitting: *Deleted

## 2019-04-12 DIAGNOSIS — K769 Liver disease, unspecified: Secondary | ICD-10-CM

## 2019-04-12 NOTE — Telephone Encounter (Signed)
Twinrix orders placed. First appointment scheduled 04/15/19 at 2:00 am.   MRI abdomen to f/u on liver lesion scheduled at [facility] on 04/20/19 at 9 am with an arrival time of 8:30 am. Patient will be told to be NPO after midnight and check in at radiology.   Request labs placed in Epic (LFT, IgG, ANA) for late February.

## 2019-04-12 NOTE — Telephone Encounter (Signed)
-----   Message from Marlon Pel, RN sent at 04/08/2019  9:14 AM EST -----  ----- Message ----- From: Marlon Pel, RN Sent: 04/08/2019 To: Marlon Pel, RN  Gatha Mayer, MD sent to Marlon Pel, RN Please contact the patient and tell him the labs show a mildly + ANA as main finding - this is not high enough to say it is an autoimmune problem. He has not had HBV and titer for HEp A borderline so he needs:   1) HAV and HBV vaccinatons  2) MRI of abdomen - f/u liver lesion from 12/2018 MRI - DO in early MARCH  3) LFT, IgG, ANA late February    Call and remind about the lab and set up the MRI

## 2019-04-14 ENCOUNTER — Telehealth: Payer: Self-pay | Admitting: Internal Medicine

## 2019-04-14 NOTE — Telephone Encounter (Signed)
Patient called has questions and concerns in reference to his upcoming appt for Hep injection

## 2019-04-15 ENCOUNTER — Encounter: Payer: Self-pay | Admitting: *Deleted

## 2019-04-15 NOTE — Telephone Encounter (Signed)
Patient called to inform RN that he has already received doses 1 and 2 of twinrix vaccine. RN and Dr. Carlean Purl unaware of this when scheduling the patient. He received his first two vaccines on 1/6 and 2/8.   The patient also requested his MR abd be moved to the following week. Scheduled on 3/10 at 8: 00 am. MyChart message sent to patient as requested.

## 2019-04-15 NOTE — Telephone Encounter (Signed)
Pt called returning your call 

## 2019-04-15 NOTE — Telephone Encounter (Signed)
Left message for the patient to call back.

## 2019-04-20 ENCOUNTER — Ambulatory Visit (HOSPITAL_COMMUNITY): Payer: Managed Care, Other (non HMO)

## 2019-04-27 ENCOUNTER — Ambulatory Visit (HOSPITAL_COMMUNITY)
Admission: RE | Admit: 2019-04-27 | Discharge: 2019-04-27 | Disposition: A | Discharge status: Home / Self Care | Payer: Managed Care, Other (non HMO) | Source: Ambulatory Visit | Attending: Internal Medicine | Admitting: Internal Medicine

## 2019-04-27 ENCOUNTER — Other Ambulatory Visit: Payer: Self-pay

## 2019-04-27 DIAGNOSIS — K769 Liver disease, unspecified: Secondary | ICD-10-CM | POA: Diagnosis not present

## 2019-04-27 MED ORDER — GADOBUTROL 1 MMOL/ML IV SOLN
10.0000 mL | Freq: Once | INTRAVENOUS | Status: AC | PRN
  Administered 2019-04-27: 10 mL via INTRAVENOUS

## 2019-04-28 ENCOUNTER — Encounter: Payer: Self-pay | Admitting: Internal Medicine

## 2019-07-30 ENCOUNTER — Telehealth: Payer: Self-pay | Admitting: Family Medicine

## 2019-08-01 NOTE — Telephone Encounter (Signed)
Please schedule CPE or Medication Refill for [facility].  He will need this appointment prior to any refills.

## 2019-08-01 NOTE — Telephone Encounter (Signed)
Patient left a voicemail requesting refill on sertraline. Please call patient and schedule physical then send back to CMA for refill.

## 2019-08-02 ENCOUNTER — Encounter: Payer: Self-pay | Admitting: Family Medicine

## 2019-08-02 NOTE — Telephone Encounter (Signed)
Pt is scheduled for a physical on 6/30 with labs on 6/28

## 2019-08-08 ENCOUNTER — Other Ambulatory Visit: Payer: Self-pay | Admitting: Family Medicine

## 2019-08-08 DIAGNOSIS — Z131 Encounter for screening for diabetes mellitus: Secondary | ICD-10-CM

## 2019-08-08 DIAGNOSIS — Z0000 Encounter for general adult medical examination without abnormal findings: Secondary | ICD-10-CM

## 2019-08-15 ENCOUNTER — Other Ambulatory Visit (INDEPENDENT_AMBULATORY_CARE_PROVIDER_SITE_OTHER): Payer: Managed Care, Other (non HMO)

## 2019-08-15 DIAGNOSIS — Z131 Encounter for screening for diabetes mellitus: Secondary | ICD-10-CM

## 2019-08-15 DIAGNOSIS — Z0000 Encounter for general adult medical examination without abnormal findings: Secondary | ICD-10-CM | POA: Diagnosis not present

## 2019-08-15 LAB — CBC WITH DIFFERENTIAL/PLATELET
Basophils Absolute: 0 10*3/uL (ref 0.0–0.1)
Basophils Relative: 1.1 % (ref 0.0–3.0)
Eosinophils Absolute: 0.1 10*3/uL (ref 0.0–0.7)
Eosinophils Relative: 2 % (ref 0.0–5.0)
HCT: 46.1 % (ref 39.0–52.0)
Hemoglobin: 16 g/dL (ref 13.0–17.0)
Lymphocytes Relative: 30.6 % (ref 12.0–46.0)
Lymphs Abs: 1.4 10*3/uL (ref 0.7–4.0)
MCHC: 34.6 g/dL (ref 30.0–36.0)
MCV: 95 fl (ref 78.0–100.0)
Monocytes Absolute: 0.5 10*3/uL (ref 0.1–1.0)
Monocytes Relative: 10.5 % (ref 3.0–12.0)
Neutro Abs: 2.6 10*3/uL (ref 1.4–7.7)
Neutrophils Relative %: 55.8 % (ref 43.0–77.0)
Platelets: 112 10*3/uL — ABNORMAL LOW (ref 150.0–400.0)
RBC: 4.85 Mil/uL (ref 4.22–5.81)
RDW: 13.6 % (ref 11.5–15.5)
WBC: 4.6 10*3/uL (ref 4.0–10.5)

## 2019-08-15 LAB — LIPID PANEL
Cholesterol: 222 mg/dL — ABNORMAL HIGH (ref 0–200)
HDL: 44.3 mg/dL (ref >39.00)
LDL Cholesterol: 148 mg/dL — ABNORMAL HIGH (ref 0–99)
NonHDL: 177.95
Total CHOL/HDL Ratio: 5
Triglycerides: 148 mg/dL (ref 0.0–149.0)
VLDL: 29.6 mg/dL (ref 0.0–40.0)

## 2019-08-15 LAB — BASIC METABOLIC PANEL
BUN: 11 mg/dL (ref 6–23)
CO2: 27 mEq/L (ref 19–32)
Calcium: 9.4 mg/dL (ref 8.4–10.5)
Chloride: 105 mEq/L (ref 96–112)
Creatinine, Ser: 0.91 mg/dL (ref 0.40–1.50)
GFR: 88.47 mL/min (ref >60.00)
Glucose, Bld: 106 mg/dL — ABNORMAL HIGH (ref 70–99)
Potassium: 4.4 mEq/L (ref 3.5–5.1)
Sodium: 139 mEq/L (ref 135–145)

## 2019-08-15 LAB — HEPATIC FUNCTION PANEL
ALT: 37 U/L (ref 0–53)
AST: 43 U/L — ABNORMAL HIGH (ref 0–37)
Albumin: 4.4 g/dL (ref 3.5–5.2)
Alkaline Phosphatase: 58 U/L (ref 39–117)
Bilirubin, Direct: 0.1 mg/dL (ref 0.0–0.3)
Total Bilirubin: 0.6 mg/dL (ref 0.2–1.2)
Total Protein: 7.4 g/dL (ref 6.0–8.3)

## 2019-08-15 LAB — HEMOGLOBIN A1C: Hgb A1c MFr Bld: 5.3 % (ref 4.6–6.5)

## 2019-08-16 ENCOUNTER — Other Ambulatory Visit: Payer: Self-pay

## 2019-08-16 LAB — PSA, TOTAL WITH REFLEX TO PSA, FREE: PSA, Total: 0.4 ng/mL (ref <=4.0)

## 2019-08-17 ENCOUNTER — Ambulatory Visit (INDEPENDENT_AMBULATORY_CARE_PROVIDER_SITE_OTHER): Payer: Managed Care, Other (non HMO) | Admitting: Family Medicine

## 2019-08-17 ENCOUNTER — Other Ambulatory Visit: Payer: Self-pay

## 2019-08-17 ENCOUNTER — Encounter: Payer: Self-pay | Admitting: Family Medicine

## 2019-08-17 VITALS — BP 130/86 | HR 68 | Temp 98.2°F | Ht 69.5 in | Wt 214.5 lb

## 2019-08-17 DIAGNOSIS — Z0000 Encounter for general adult medical examination without abnormal findings: Secondary | ICD-10-CM

## 2019-08-17 DIAGNOSIS — K7030 Alcoholic cirrhosis of liver without ascites: Secondary | ICD-10-CM | POA: Diagnosis not present

## 2019-08-17 MED ORDER — TRIAMCINOLONE ACETONIDE 0.1 % EX CREA: 1.0000 "application " | TOPICAL_CREAM | Freq: Two times a day (BID) | CUTANEOUS | 1 refills | Status: AC

## 2019-08-17 NOTE — Progress Notes (Signed)
Kyle Divito T. Rondal Vandevelde, MD, [name], [address]  Phone: [phone]  FAX: [name] - 49 y.o. male  MRN [id number]  Date of Birth: 1971-02-17  Date: 08/17/2019  PCP: Owens Loffler, MD  Referral: Owens Loffler, MD  Chief Complaint  Patient presents with  . Annual Exam    This visit occurred during the SARS-CoV-2 public health emergency.  Safety protocols were in place, including screening questions prior to the visit, additional usage of staff PPE, and extensive cleaning of exam room while observing appropriate contact time as indicated for disinfecting solutions.   Patient Care Team: Owens Loffler, MD as PCP - General Subjective:   Kyle Mercer is a 49 y.o. pleasant patient who presents with the following:  Preventative Health Maintenance Visit:  Health Maintenance Summary Reviewed and updated, unless pt declines services.  Tobacco History Reviewed. Alcohol: In the past with significant use, now he was sober for 3 or 4 months, but he is drinking again now.   Exercise Habits: Some activity, rec at least 30 mins 5 times a week - has been about a year or so ago, has beeen every day will walk at the park around lunch time.  Has a pool at home.  STD concerns: no risk or activity to increase risk Drug Use: None  Cirrhosis. ETOH. March lost his brother in March.  Drinking about 2 glasses of wine - 6 glasses of wine in a week. None for 3 months.  3/4 a bottle each night.   Nurse visit at least after 08/26/2019.  Health Maintenance  Topic Date Due  . INFLUENZA VACCINE  09/18/2019  . TETANUS/TDAP  11/23/2022  . COVID-19 Vaccine  Completed  . Hepatitis C Screening  Completed  . HIV Screening  Completed   Immunization History  Administered Date(s) Administered  . Hep A / Hep B 02/23/2019, 03/28/2019  . Influenza,inj,Quad PF,6+ Mos  11/22/2012, 11/10/2016, 11/11/2017  . Influenza,inj,quad, With Preservative 03/05/2015  . Influenza-Unspecified 11/16/2018  . PFIZER SARS-COV-2 Vaccination 05/08/2019, 05/29/2019  . Tdap 11/22/2012   Patient Active Problem List   Diagnosis Date Noted  . Alcoholic cirrhosis of liver without ascites ([place]) 08/18/2019  . Liver lesion hepatic subsegment four? 02/03/2019  . SLEEP APNEA 06/25/2009  . Anxiety state 06/25/2008  . Allergic rhinitis 06/25/2008  . GERD 06/25/2008  . NEPHROLITHIASIS, HX OF 06/25/2008    Past Medical History:  Diagnosis Date  . Abnormal transaminases question cirrhosis, suspect alcohol as cause 02/03/2019  . Alcoholic cirrhosis of liver without ascites ([place]) 08/18/2019  . Anxiety   . Chronic back pain   . Chronic neck pain   . GERD (gastroesophageal reflux disease)   . History of nephrolithiasis   . Hyperlipidemia   . Sleep apnea   . Thrombocytopenia ([place]) 02/03/2019    Past Surgical History:  Procedure Laterality Date  . ESOPHAGOGASTRODUODENOSCOPY  10/2018  . ROTATOR CUFF REPAIR Right     Family History  Problem Relation Age of Onset  . Diabetes Mother   . Heart disease Mother   . Diabetes Father   . Heart disease Father   . Diabetes Brother   . Colon cancer Neg Hx   . Colon polyps Neg Hx   . Esophageal cancer Neg Hx   . Pancreatic cancer Neg Hx   . Stomach cancer Neg Hx   . Liver disease Neg Hx  Past Medical History, Surgical History, Social History, Family History, Problem List, Medications, and Allergies have been reviewed and updated if relevant.  Review of Systems: Pertinent positives are listed above.  Otherwise, a full 14 point review of systems has been done in full and it is negative except where it is noted positive.  Objective:   BP 130/86   Pulse 68   Temp 98.2 F (36.8 C) (Temporal)   Ht 5' 9.5" (1.765 m)   Wt 214 lb 8 oz (97.3 kg)   SpO2 98%   BMI 31.22 kg/m  Ideal Body Weight: Weight in (lb) to have BMI = 25:  171.4  Ideal Body Weight: Weight in (lb) to have BMI = 25: 171.4 No exam data present Depression screen [facility] 2/9 08/17/2019 11/11/2017 11/10/2016  Decreased Interest 0 0 0  Down, Depressed, Hopeless 0 0 0  PHQ - 2 Score 0 0 0     GEN: well developed, well nourished, no acute distress Eyes: conjunctiva and lids normal, PERRLA, EOMI ENT: TM clear, nares clear, oral exam WNL Neck: supple, no lymphadenopathy, no thyromegaly, no JVD Pulm: clear to auscultation and percussion, respiratory effort normal CV: regular rate and rhythm, S1-S2, no murmur, rub or gallop, no bruits, peripheral pulses normal and symmetric, no cyanosis, clubbing, edema or varicosities GI: soft, non-tender; no hepatosplenomegaly, masses; active bowel sounds all quadrants GU: no hernia, testicular mass, penile discharge Lymph: no cervical, axillary or inguinal adenopathy MSK: gait normal, muscle tone and strength WNL, no joint swelling, effusions, discoloration, crepitus  SKIN: clear, good turgor, color WNL, no rashes, lesions, or ulcerations -no skin change consistent with pruritus per his report. Neuro: normal mental status, normal strength, sensation, and motion Psych: alert; oriented to person, place and time, normally interactive and not anxious or depressed in appearance.  All labs reviewed with patient. Results for orders placed or performed in visit on 08/15/19  PSA, Total with Reflex to PSA, Free  Result Value Ref Range   PSA, Total 0.4 < OR = 4.0 ng/mL  Hemoglobin A1c  Result Value Ref Range   Hgb A1c MFr Bld 5.3 4.6 - 6.5 %  Basic metabolic panel  Result Value Ref Range   Sodium 139 135 - 145 mEq/L   Potassium 4.4 3.5 - 5.1 mEq/L   Chloride 105 96 - 112 mEq/L   CO2 27 19 - 32 mEq/L   Glucose, Bld 106 (H) 70 - 99 mg/dL   BUN 11 6 - 23 mg/dL   Creatinine, Ser 0.91 0.40 - 1.50 mg/dL   GFR 88.47 >60.00 mL/min   Calcium 9.4 8.4 - 10.5 mg/dL  Hepatic function panel  Result Value Ref Range   Total  Bilirubin 0.6 0.2 - 1.2 mg/dL   Bilirubin, Direct 0.1 0.0 - 0.3 mg/dL   Alkaline Phosphatase 58 39 - 117 U/L   AST 43 (H) 0 - 37 U/L   ALT 37 0 - 53 U/L   Total Protein 7.4 6.0 - 8.3 g/dL   Albumin 4.4 3.5 - 5.2 g/dL  CBC with Differential/Platelet  Result Value Ref Range   WBC 4.6 4.0 - 10.5 K/uL   RBC 4.85 4.22 - 5.81 Mil/uL   Hemoglobin 16.0 13.0 - 17.0 g/dL   HCT 46.1 39 - 52 %   MCV 95.0 78.0 - 100.0 fl   MCHC 34.6 30.0 - 36.0 g/dL   RDW 13.6 11.5 - 15.5 %   Platelets 112.0 (L) 150 - 400 K/uL   Neutrophils Relative %  55.8 43 - 77 %   Lymphocytes Relative 30.6 12 - 46 %   Monocytes Relative 10.5 3 - 12 %   Eosinophils Relative 2.0 0 - 5 %   Basophils Relative 1.1 0 - 3 %   Neutro Abs 2.6 1.4 - 7.7 K/uL   Lymphs Abs 1.4 0.7 - 4.0 K/uL   Monocytes Absolute 0.5 0 - 1 K/uL   Eosinophils Absolute 0.1 0 - 0 K/uL   Basophils Absolute 0.0 0 - 0 K/uL  Lipid panel  Result Value Ref Range   Cholesterol 222 (H) 0 - 200 mg/dL   Triglycerides 148.0 0 - 149 mg/dL   HDL 44.30 >39.00 mg/dL   VLDL 29.6 0.0 - 40.0 mg/dL   LDL Cholesterol 148 (H) 0 - 99 mg/dL   Total CHOL/HDL Ratio 5    NonHDL 177.95     Assessment and Plan:     ICD-10-CM   1. Healthcare maintenance  Z00.00   2. Alcoholic cirrhosis of liver without ascites (HCC)  K70.30    I strongly urged him to stop drinking entirely, and he does have cirrhosis.  He started drinking again when his brother died from COVID-65.  He does understand that this increases his risk for worsening cirrhosis.  Additionally, recommended that he have some weight loss.  He does have some itchiness on the posterior aspect of his thighs, so I gave him some triamcinolone.  Health Maintenance Exam: The patient's preventative maintenance and recommended screening tests for an annual wellness exam were reviewed in full today. Brought up to date unless services declined.  Counselled on the importance of diet, exercise, and its role in overall health  and mortality. The patient's FH and SH was reviewed, including their home life, tobacco status, and drug and alcohol status.  Follow-up in 1 year for physical exam or additional follow-up below.  Follow-up: No follow-ups on file. Or follow-up in 1 year if not noted.  Meds ordered this encounter  Medications  . triamcinolone cream (KENALOG) 0.1 %    Sig: Apply 1 application topically 2 (two) times daily.    Dispense:  454 g    Refill:  1   There are no discontinued medications. No orders of the defined types were placed in this encounter.   Signed,  Maud Deed. Trudie Cervantes, MD   Allergies as of 08/17/2019      Reactions   Cefdinir    REACTION: cramping in stomach, vomiting and diarrhea      Medication List       Accurate as of August 17, 2019 11:59 PM. If you have any questions, ask your nurse or doctor.        Aleve 220 MG tablet Generic drug: naproxen sodium Take 220 mg by mouth daily as needed.   cyclobenzaprine 10 MG tablet Commonly known as: FLEXERIL Take 1 tablet (10 mg total) by mouth 3 (three) times daily as needed for muscle spasms.   fluticasone 50 MCG/ACT nasal spray Commonly known as: FLONASE PLACE 2 SPRAYS INTO THE NOSE ONCE DAILY.   NON FORMULARY C-PAP machine, use at night   sertraline 50 MG tablet Commonly known as: ZOLOFT TAKE 1 TABLET BY MOUTH EVERY DAY   triamcinolone cream 0.1 % Commonly known as: KENALOG Apply 1 application topically 2 (two) times daily. Started by: Owens Loffler, MD

## 2019-08-18 ENCOUNTER — Encounter: Payer: Self-pay | Admitting: Family Medicine

## 2019-08-18 DIAGNOSIS — K7030 Alcoholic cirrhosis of liver without ascites: Secondary | ICD-10-CM

## 2019-08-18 HISTORY — DX: Alcoholic cirrhosis of liver without ascites: K70.30

## 2019-08-19 ENCOUNTER — Telehealth: Payer: Self-pay

## 2019-08-19 ENCOUNTER — Encounter: Payer: Self-pay | Admitting: Family Medicine

## 2019-08-19 NOTE — Telephone Encounter (Signed)
-----   Message from [place] sent at 03/28/2019 11:04 AM EST ----- Needs last 6 month Twinirx injection

## 2019-08-19 NOTE — Telephone Encounter (Signed)
I spoke with Kyle Mercer and he said his PCP office has the twinrix and he is going to get it there. He is going to call them and set up an appointment.

## 2019-08-23 NOTE — Telephone Encounter (Signed)
Patient has appointment 08/25/2019 for his Twinrix vaccine at his PCP office.

## 2019-08-23 NOTE — Telephone Encounter (Signed)
Butch Penny, can you help with this?

## 2019-08-24 ENCOUNTER — Other Ambulatory Visit: Payer: Self-pay | Admitting: Family Medicine

## 2019-08-25 ENCOUNTER — Ambulatory Visit: Payer: Managed Care, Other (non HMO)

## 2019-09-01 ENCOUNTER — Ambulatory Visit: Payer: Managed Care, Other (non HMO)

## 2019-12-22 ENCOUNTER — Other Ambulatory Visit: Payer: Self-pay | Admitting: Family Medicine

## 2020-05-18 ENCOUNTER — Telehealth: Payer: Self-pay

## 2020-05-18 NOTE — Telephone Encounter (Signed)
Kyle Mercer front office mgr asked me to contact Kyle Mercer and triage. I spoke with Kyle Mercer and for 2 wks Kyle Mercer has had a tickle in throat and then begins with dry cough and Kyle Mercer wife said she can hear wheezing when Kyle Mercer coughs. Kyle Mercer has tried OTC meds with no relief. No CP or chest tightness, no SOB or fever and no S/T.  Kyle Mercer also has head or sinus congestion. Kyle Mercer does want someone to listen to Kyle Mercer chest. I explained the guidelines our office was given and cannot bring Kyle Mercer into office with respiratory symptoms. Kyle Mercer voiced understanding and will go to [facility] in [place] for eval and possible testing and to cancel appt with Dr Lorelei Pont on 05/23/20. Sending not to Dr Lorelei Pont as Juluis Rainier.

## 2020-05-23 ENCOUNTER — Telehealth: Payer: Managed Care, Other (non HMO) | Admitting: Family Medicine

## 2020-08-05 ENCOUNTER — Telehealth: Payer: Self-pay | Admitting: Family Medicine

## 2020-08-05 NOTE — Telephone Encounter (Signed)
Please schedule CPE with fasting labs prior with Dr. Lorelei Pont for sometime in July.

## 2020-08-07 NOTE — Telephone Encounter (Signed)
Spoke with patient scheduled CPE with fasting labs 

## 2020-08-28 ENCOUNTER — Other Ambulatory Visit: Payer: Self-pay | Admitting: Family Medicine

## 2020-08-28 DIAGNOSIS — Z13220 Encounter for screening for lipoid disorders: Secondary | ICD-10-CM

## 2020-08-28 DIAGNOSIS — Z125 Encounter for screening for malignant neoplasm of prostate: Secondary | ICD-10-CM

## 2020-08-28 DIAGNOSIS — Z79899 Other long term (current) drug therapy: Secondary | ICD-10-CM

## 2020-08-28 DIAGNOSIS — Z131 Encounter for screening for diabetes mellitus: Secondary | ICD-10-CM

## 2020-09-07 ENCOUNTER — Other Ambulatory Visit: Payer: Self-pay

## 2020-09-07 ENCOUNTER — Other Ambulatory Visit (INDEPENDENT_AMBULATORY_CARE_PROVIDER_SITE_OTHER): Payer: Managed Care, Other (non HMO)

## 2020-09-07 DIAGNOSIS — Z79899 Other long term (current) drug therapy: Secondary | ICD-10-CM

## 2020-09-07 DIAGNOSIS — Z13220 Encounter for screening for lipoid disorders: Secondary | ICD-10-CM

## 2020-09-07 DIAGNOSIS — Z131 Encounter for screening for diabetes mellitus: Secondary | ICD-10-CM | POA: Diagnosis not present

## 2020-09-07 DIAGNOSIS — Z125 Encounter for screening for malignant neoplasm of prostate: Secondary | ICD-10-CM

## 2020-09-07 LAB — CBC WITH DIFFERENTIAL/PLATELET
Basophils Absolute: 0 10*3/uL (ref 0.0–0.1)
Basophils Relative: 0.9 % (ref 0.0–3.0)
Eosinophils Absolute: 0.1 10*3/uL (ref 0.0–0.7)
Eosinophils Relative: 2 % (ref 0.0–5.0)
HCT: 44.1 % (ref 39.0–52.0)
Hemoglobin: 15.1 g/dL (ref 13.0–17.0)
Lymphocytes Relative: 25.8 % (ref 12.0–46.0)
Lymphs Abs: 1.2 10*3/uL (ref 0.7–4.0)
MCHC: 34.3 g/dL (ref 30.0–36.0)
MCV: 98.9 fl (ref 78.0–100.0)
Monocytes Absolute: 0.6 10*3/uL (ref 0.1–1.0)
Monocytes Relative: 13.9 % — ABNORMAL HIGH (ref 3.0–12.0)
Neutro Abs: 2.6 10*3/uL (ref 1.4–7.7)
Neutrophils Relative %: 57.4 % (ref 43.0–77.0)
Platelets: 85 10*3/uL — ABNORMAL LOW (ref 150.0–400.0)
RBC: 4.46 Mil/uL (ref 4.22–5.81)
RDW: 13.2 % (ref 11.5–15.5)
WBC: 4.6 10*3/uL (ref 4.0–10.5)

## 2020-09-07 LAB — LIPID PANEL
Cholesterol: 221 mg/dL — ABNORMAL HIGH (ref 0–200)
HDL: 47.2 mg/dL (ref >39.00)
LDL Cholesterol: 143 mg/dL — ABNORMAL HIGH (ref 0–99)
NonHDL: 173.41
Total CHOL/HDL Ratio: 5
Triglycerides: 150 mg/dL — ABNORMAL HIGH (ref 0.0–149.0)
VLDL: 30 mg/dL (ref 0.0–40.0)

## 2020-09-07 LAB — BASIC METABOLIC PANEL
BUN: 7 mg/dL (ref 6–23)
CO2: 28 mEq/L (ref 19–32)
Calcium: 8.9 mg/dL (ref 8.4–10.5)
Chloride: 104 mEq/L (ref 96–112)
Creatinine, Ser: 0.73 mg/dL (ref 0.40–1.50)
GFR: 106.25 mL/min (ref >60.00)
Glucose, Bld: 136 mg/dL — ABNORMAL HIGH (ref 70–99)
Potassium: 3.9 mEq/L (ref 3.5–5.1)
Sodium: 139 mEq/L (ref 135–145)

## 2020-09-07 LAB — HEMOGLOBIN A1C: Hgb A1c MFr Bld: 5.3 % (ref 4.6–6.5)

## 2020-09-07 LAB — HEPATIC FUNCTION PANEL
ALT: 53 U/L (ref 0–53)
AST: 74 U/L — ABNORMAL HIGH (ref 0–37)
Albumin: 3.8 g/dL (ref 3.5–5.2)
Alkaline Phosphatase: 87 U/L (ref 39–117)
Bilirubin, Direct: 0.2 mg/dL (ref 0.0–0.3)
Total Bilirubin: 0.7 mg/dL (ref 0.2–1.2)
Total Protein: 6.9 g/dL (ref 6.0–8.3)

## 2020-09-09 NOTE — Progress Notes (Signed)
Kyle Burnette T. Ion Gonnella, MD, [place] at [facility] [place], [address]  Phone: [phone]  FAX: Kyle Mercer - 50 y.o. male  MRN [id number]  Date of Birth: Jul 04, 1970  Date: 09/10/2020  PCP: Owens Loffler, MD  Referral: Owens Loffler, MD  Chief Complaint  Patient presents with   Annual Exam    This visit occurred during the SARS-CoV-2 public health emergency.  Safety protocols were in place, including screening questions prior to the visit, additional usage of staff PPE, and extensive cleaning of exam room while observing appropriate contact time as indicated for disinfecting solutions.   Patient Care Team: Owens Loffler, MD as PCP - General Subjective:   Kyle Mercer is a 50 y.o. pleasant patient who presents with the following:  Preventative Health Maintenance Visit:  Health Maintenance Summary Reviewed and updated, unless pt declines services.  Tobacco History Reviewed. Alcohol: glass or two a night Exercise Habits: Some PT.  Was walking daily.  Not when hotter.  STD concerns: no risk or activity to increase risk Drug Use: None  Shingrix Colon - none has been done --- scheduling Covid #3.  Check on ETOH He does have a history of cirrhosis and he has seen Dr. Carlean Purl before.  This is presumed alcoholic cirrhosis, and his LFTs have decreased when he is off of alcohol. Currently he is drinking 1-2 drinks of wine nightly and more on the weekends.  He also has some platelets that have been drifting downwards now in the 80,000's.  Wt Readings from Last 3 Encounters:  09/10/20 231 lb 8 oz (105 kg)  08/17/19 214 lb 8 oz (97.3 kg)  02/03/19 216 lb (98 kg)     Health Maintenance  Topic Date Due   Pneumococcal Vaccine 97-52 Years old (1 - PCV) Never done   COLONOSCOPY (Pts 45-33yr Insurance coverage will need to be confirmed)  Never done   INFLUENZA VACCINE  09/17/2020   COVID-19  Vaccine (5 - Booster for Pfizer series) 10/31/2020   Zoster Vaccines- Shingrix (2 of 2) 11/05/2020   TETANUS/TDAP  11/23/2022   Hepatitis C Screening  Completed   HIV Screening  Completed   HPV VACCINES  Aged Out   Immunization History  Administered Date(s) Administered   Hep A / Hep B 02/23/2019, 03/28/2019   Influenza Inj Mdck Quad Pf 01/02/2020   Influenza,inj,Quad PF,6+ Mos 11/22/2012, 11/10/2016, 11/11/2017   Influenza,inj,quad, With Preservative 03/05/2015   Influenza-Unspecified 11/16/2018   PFIZER(Purple Top)SARS-COV-2 Vaccination 05/08/2019, 05/29/2019, 12/03/2019, 06/30/2020   Tdap 11/22/2012   Zoster Recombinat (Shingrix) 09/10/2020   Patient Active Problem List   Diagnosis Date Noted   Alcoholic cirrhosis of liver without ascites ([place] 08/18/2019   Liver lesion hepatic subsegment four? 02/03/2019   Thrombocytopenia ([facility] 02/03/2019   SLEEP APNEA 06/25/2009   Anxiety state 06/25/2008   Allergic rhinitis 06/25/2008   GERD 06/25/2008   NEPHROLITHIASIS, HX OF 06/25/2008    Past Medical History:  Diagnosis Date   Abnormal transaminases question cirrhosis, suspect alcohol as cause [id number]  Alcoholic cirrhosis of liver without ascites ([facility] 08/18/2019   Anxiety    Chronic back pain    Chronic neck pain    GERD (gastroesophageal reflux disease)    History of nephrolithiasis    Hyperlipidemia    Sleep apnea    Thrombocytopenia ([facility] 02/03/2019    Past Surgical History:  Procedure Laterality Date   ESOPHAGOGASTRODUODENOSCOPY  10/2018   ROTATOR  CUFF REPAIR Right     Family History  Problem Relation Age of Onset   Diabetes Mother    Heart disease Mother    Diabetes Father    Heart disease Father    Diabetes Brother    Colon cancer Neg Hx    Colon polyps Neg Hx    Esophageal cancer Neg Hx    Pancreatic cancer Neg Hx    Stomach cancer Neg Hx    Liver disease Neg Hx     Past Medical History, Surgical History, Social History, Family History, Problem  List, Medications, and Allergies have been reviewed and updated if relevant.  Review of Systems: Pertinent positives are listed above.  Otherwise, a full 14 point review of systems has been done in full and it is negative except where it is noted positive.  Objective:   BP (!) 150/82   Pulse 80   Temp 98.4 F (36.9 C) (Temporal)   Ht 5' 9.75" (1.772 m)   Wt 231 lb 8 oz (105 kg)   SpO2 97%   BMI 33.46 kg/m  Ideal Body Weight: Weight in (lb) to have BMI = 25: 172.6  Ideal Body Weight: Weight in (lb) to have BMI = 25: 172.6 No results found. Depression screen [facility] 2/9 08/17/2019 11/11/2017 11/10/2016  Decreased Interest 0 0 0  Down, Depressed, Hopeless 0 0 0  PHQ - 2 Score 0 0 0     GEN: well developed, well nourished, no acute distress Eyes: conjunctiva and lids normal, PERRLA, EOMI ENT: TM clear, nares clear, oral exam WNL Neck: supple, no lymphadenopathy, no thyromegaly, no JVD Pulm: clear to auscultation and percussion, respiratory effort normal CV: regular rate and rhythm, S1-S2, no murmur, rub or gallop, no bruits, peripheral pulses normal and symmetric, no cyanosis, clubbing, edema or varicosities GI: soft, non-tender; no hepatosplenomegaly, masses; active bowel sounds all quadrants GU: deferred Lymph: no cervical, axillary or inguinal adenopathy MSK: gait normal, muscle tone and strength WNL, no joint swelling, effusions, discoloration, crepitus  SKIN: clear, good turgor, color WNL, no rashes, lesions, or ulcerations Neuro: normal mental status, normal strength, sensation, and motion Psych: alert; oriented to person, place and time, normally interactive and not anxious or depressed in appearance.  All labs reviewed with patient. Results for orders placed or performed in visit on 09/07/20  PSA, Total with Reflex to PSA, Free  Result Value Ref Range   PSA, Total 0.4 < OR = 4.0 ng/mL  Hemoglobin A1c  Result Value Ref Range   Hgb A1c MFr Bld 5.3 4.6 - 6.5 %  Basic  metabolic panel  Result Value Ref Range   Sodium 139 135 - 145 mEq/L   Potassium 3.9 3.5 - 5.1 mEq/L   Chloride 104 96 - 112 mEq/L   CO2 28 19 - 32 mEq/L   Glucose, Bld 136 (H) 70 - 99 mg/dL   BUN 7 6 - 23 mg/dL   Creatinine, Ser 0.73 0.40 - 1.50 mg/dL   GFR 106.25 >60.00 mL/min   Calcium 8.9 8.4 - 10.5 mg/dL  Hepatic function panel  Result Value Ref Range   Total Bilirubin 0.7 0.2 - 1.2 mg/dL   Bilirubin, Direct 0.2 0.0 - 0.3 mg/dL   Alkaline Phosphatase 87 39 - 117 U/L   AST 74 (H) 0 - 37 U/L   ALT 53 0 - 53 U/L   Total Protein 6.9 6.0 - 8.3 g/dL   Albumin 3.8 3.5 - 5.2 g/dL  CBC with Differential/Platelet  Result  Value Ref Range   WBC 4.6 4.0 - 10.5 K/uL   RBC 4.46 4.22 - 5.81 Mil/uL   Hemoglobin 15.1 13.0 - 17.0 g/dL   HCT 44.1 39.0 - 52.0 %   MCV 98.9 78.0 - 100.0 fl   MCHC 34.3 30.0 - 36.0 g/dL   RDW 13.2 11.5 - 15.5 %   Platelets 85.0 (L) 150.0 - 400.0 K/uL   Neutrophils Relative % 57.4 43.0 - 77.0 %   Lymphocytes Relative 25.8 12.0 - 46.0 %   Monocytes Relative 13.9 (H) 3.0 - 12.0 %   Eosinophils Relative 2.0 0.0 - 5.0 %   Basophils Relative 0.9 0.0 - 3.0 %   Neutro Abs 2.6 1.4 - 7.7 K/uL   Lymphs Abs 1.2 0.7 - 4.0 K/uL   Monocytes Absolute 0.6 0.1 - 1.0 K/uL   Eosinophils Absolute 0.1 0.0 - 0.7 K/uL   Basophils Absolute 0.0 0.0 - 0.1 K/uL  Lipid panel  Result Value Ref Range   Cholesterol 221 (H) 0 - 200 mg/dL   Triglycerides 150.0 (H) 0.0 - 149.0 mg/dL   HDL 47.20 >39.00 mg/dL   VLDL 30.0 0.0 - 40.0 mg/dL   LDL Cholesterol 143 (H) 0 - 99 mg/dL   Total CHOL/HDL Ratio 5    NonHDL 173.41     Assessment and Plan:     ICD-10-CM   1. Healthcare maintenance  Z00.00 Varicella-zoster vaccine IM (Shingrix)    2. Thrombocytopenia ([place])  D69.6 CBC with Differential/Platelet    Pathologist smear review    3. Alcoholic cirrhosis of liver without ascites ([place])  K70.30 Ambulatory referral to Gastroenterology    4. Need for shingles vaccine  Z23 Varicella-zoster  vaccine IM (Shingrix)     Cirrhosis, LFTs of elevated again, I want to have GI involved long-term, and I do think we need to have their recommendations right now.  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  I am going to have the patient recheck his CBC as well as get a peripheral smear in 1 month's time.  If this returns to baseline, then I think that we can just follow this.  If it remains at 80,000 or gets lower, then hematology will need to be involved.  Health Maintenance Exam: The patient's preventative maintenance and recommended screening tests for an annual wellness exam were reviewed in full today. Brought up to date unless services declined.  Counselled on the importance of diet, exercise, and its role in overall health and mortality. The patient's FH and SH was reviewed, including their home life, tobacco status, and drug and alcohol status.  Follow-up in 1 year for physical exam or additional follow-up below.  Follow-up: Return in about 4 weeks (around 10/08/2020) for blood draw only. Or follow-up in 1 year if not noted.  No orders of the defined types were placed in this encounter.  There are no discontinued medications. Orders Placed This Encounter  Procedures   Varicella-zoster vaccine IM (Shingrix)   CBC with Differential/Platelet   Pathologist smear review   Ambulatory referral to Gastroenterology     Signed,  Frederico Hamman T. Dannetta Lekas, MD   Allergies as of 09/10/2020       Reactions   Cefdinir    REACTION: cramping in stomach, vomiting and diarrhea        Medication List        Accurate as of September 10, 2020  3:44 PM. If you have any questions, ask your nurse or doctor.  cyclobenzaprine 10 MG tablet Commonly known as: FLEXERIL Take 1 tablet (10 mg total) by mouth 3 (three) times daily as needed for muscle spasms.   fexofenadine 180 MG tablet Commonly known as: ALLEGRA Take 180 mg by mouth at bedtime.   fluticasone 50  MCG/ACT nasal spray Commonly known as: FLONASE PLACE 2 SPRAYS INTO THE NOSE ONCE DAILY.   naproxen sodium 220 MG tablet Commonly known as: ALEVE Take 220 mg by mouth daily as needed.   NON FORMULARY C-PAP machine, use at night   sertraline 50 MG tablet Commonly known as: ZOLOFT TAKE 1 TABLET BY MOUTH EVERY DAY

## 2020-09-10 ENCOUNTER — Ambulatory Visit (INDEPENDENT_AMBULATORY_CARE_PROVIDER_SITE_OTHER): Payer: Managed Care, Other (non HMO) | Admitting: Family Medicine

## 2020-09-10 ENCOUNTER — Other Ambulatory Visit: Payer: Self-pay

## 2020-09-10 ENCOUNTER — Encounter: Payer: Self-pay | Admitting: Family Medicine

## 2020-09-10 VITALS — BP 150/82 | HR 80 | Temp 98.4°F | Ht 69.75 in | Wt 231.5 lb

## 2020-09-10 DIAGNOSIS — Z23 Encounter for immunization: Secondary | ICD-10-CM | POA: Diagnosis not present

## 2020-09-10 DIAGNOSIS — K7030 Alcoholic cirrhosis of liver without ascites: Secondary | ICD-10-CM | POA: Diagnosis not present

## 2020-09-10 DIAGNOSIS — D696 Thrombocytopenia, unspecified: Secondary | ICD-10-CM

## 2020-09-10 DIAGNOSIS — Z0000 Encounter for general adult medical examination without abnormal findings: Secondary | ICD-10-CM

## 2020-09-10 LAB — PSA, TOTAL WITH REFLEX TO PSA, FREE: PSA, Total: 0.4 ng/mL (ref <=4.0)

## 2020-09-10 NOTE — Patient Instructions (Signed)
Check with your insurance to see if they will cover the shingles shot.  The newer [facility] shot is much better than the older shot. The [facility] shot requires 2 shots given 6 months apart.  Almost always, this shot is not covered in our office by your insurance. It costs 500 dollars, but essentially all insurances cover it at your pharmacy.  I would call your insurance number on your card to confirm this.

## 2020-09-11 ENCOUNTER — Other Ambulatory Visit (INDEPENDENT_AMBULATORY_CARE_PROVIDER_SITE_OTHER): Payer: Managed Care, Other (non HMO)

## 2020-09-11 DIAGNOSIS — R7309 Other abnormal glucose: Secondary | ICD-10-CM

## 2020-09-11 LAB — HEMOGLOBIN A1C: Hgb A1c MFr Bld: 5.2 % (ref 4.6–6.5)

## 2020-10-06 ENCOUNTER — Encounter: Payer: Self-pay | Admitting: Emergency Medicine

## 2020-10-06 ENCOUNTER — Other Ambulatory Visit: Payer: Self-pay

## 2020-10-06 ENCOUNTER — Ambulatory Visit
Admission: EM | Admit: 2020-10-06 | Discharge: 2020-10-06 | Disposition: A | Discharge status: Home / Self Care | Payer: Managed Care, Other (non HMO) | Attending: Internal Medicine | Admitting: Internal Medicine

## 2020-10-06 DIAGNOSIS — U071 COVID-19: Secondary | ICD-10-CM

## 2020-10-06 DIAGNOSIS — R059 Cough, unspecified: Secondary | ICD-10-CM

## 2020-10-06 MED ORDER — PAXLOVID (300/100) 20 X 150 MG & 10 X 100MG PO TBPK: ORAL_TABLET | ORAL | 0 refills | Status: DC

## 2020-10-06 MED ORDER — HYDROCOD POLST-CPM POLST ER 10-8 MG/5ML PO SUER: 5.0000 mL | Freq: Two times a day (BID) | ORAL | 0 refills | Status: DC

## 2020-10-06 NOTE — Discharge Instructions (Addendum)
You may take the following supplements to help your immune system be stronger to fight this viral infection Zinc 50 mg which helps decrease the virus load in your body. Take Melatonin 6-10 mg at bed time which also helps support your immune system.  Also make sure to take Vit D 5,000 IU per day with a fatty meal and Vit C 5000 mg a day until you are completely better. To prevent viral illnesses your vitamin D should be between 60-80. Stay on Vitamin D 2,000  and C  1000 mg the rest of the season.  Don't lay around, keep active and walk as much as you are able to to prevent worsening of your symptoms.  Follow up with your family Dr next week.  If you get short of breath and you are able to check  your oxygen with a pulse oxygen meter, if it gets to 92% or less, you need to go to the hospital to be admitted. If you dont have one, come back here and we will assess you.

## 2020-10-06 NOTE — ED Provider Notes (Addendum)
You may take the following supplements to help your immune system be stronger to fight this viral infection Zinc 50 mg which helps decrease the virus load in your body. Take Melatonin 6-10 mg at bed time which also helps support your immune system.  Also make sure to take Vit D 5,000 IU per day with a fatty meal and Vit C 5000 mg a day until you are completely better. To prevent viral illnesses your vitamin D should be between 60-80. Stay on Vitamin D 2,000  and C  1000 mg the rest of the season.  Don't lay around, keep active and walk as much as you are able to to prevent worsening of your symptoms.  Follow up with your family Dr next week.  If you get short of breath and you are able to check  your oxygen with a pulse oxygen meter, if it gets to 92% or less, you need to go to the hospital to be admitted. If you dont have one, come back here and we will assess you.  [facility]    CSN: [id number] Arrival date & time: 10/06/20  0539      History   Chief Complaint Chief Complaint  Patient presents with   Cough    HPI Kyle Mercer is a 50 y.o. male who presents with scratchy throat and cough onset  since yesterday. His wife has had covid and he tested positive for covid today. He denies fever. Has never had covid infection before. Has had 4 covid injections. Had HA yesterday, but is gone today. Denies body aches. His brother diet with covid 2021 and he is very concerned of having this.     Past Medical History:  Diagnosis Date   Abnormal transaminases question cirrhosis, suspect alcohol as cause 76/73/4193   Alcoholic cirrhosis of liver without ascites ([place]) 08/18/2019   Anxiety    Chronic back pain    Chronic neck pain    GERD (gastroesophageal reflux disease)    History of nephrolithiasis    Hyperlipidemia    Sleep apnea    Thrombocytopenia ([place]) 02/03/2019    Patient Active Problem List   Diagnosis Date Noted   Alcoholic cirrhosis of liver without ascites ([place])  08/18/2019   Liver lesion hepatic subsegment four? 02/03/2019   Thrombocytopenia ([place]) 02/03/2019   SLEEP APNEA 06/25/2009   Anxiety state 06/25/2008   Allergic rhinitis 06/25/2008   GERD 06/25/2008   NEPHROLITHIASIS, HX OF 06/25/2008    Past Surgical History:  Procedure Laterality Date   ESOPHAGOGASTRODUODENOSCOPY  10/2018   ROTATOR CUFF REPAIR Right        Home Medications    Prior to Admission medications   Medication Sig Start Date End Date Taking? Authorizing Provider  fluticasone (FLONASE) 50 MCG/ACT nasal spray PLACE 2 SPRAYS INTO THE NOSE ONCE DAILY. 08/05/20  Yes Copland, Frederico Hamman, MD  sertraline (ZOLOFT) 50 MG tablet TAKE 1 TABLET BY MOUTH EVERY DAY 08/05/20  Yes Copland, Frederico Hamman, MD  cyclobenzaprine (FLEXERIL) 10 MG tablet Take 1 tablet (10 mg total) by mouth 3 (three) times daily as needed for muscle spasms. 07/01/18   Copland, Frederico Hamman, MD  fexofenadine (ALLEGRA) 180 MG tablet Take 180 mg by mouth at bedtime.    [provider]  naproxen sodium (ALEVE) 220 MG tablet Take 220 mg by mouth daily as needed.    [provider]  NON FORMULARY C-PAP machine, use at night     [provider]    Family History  Family History  Problem Relation Age of Onset   Diabetes Mother    Heart disease Mother    Diabetes Father    Heart disease Father    Diabetes Brother    Colon cancer Neg Hx    Colon polyps Neg Hx    Esophageal cancer Neg Hx    Pancreatic cancer Neg Hx    Stomach cancer Neg Hx    Liver disease Neg Hx     Social History Social History   Tobacco Use   Smoking status: Former    Years: 7.00    Types: Cigarettes    Quit date: 02/18/2000    Years since quitting: 20.6   Smokeless tobacco: Never  Vaping Use   Vaping Use: Never used  Substance Use Topics   Alcohol use: Yes    Alcohol/week: 21.0 standard drinks    Types: 21 Glasses of wine per week   Drug use: No     Allergies   Cefdinir   Review of Systems Review of Systems   Constitutional:  Negative for activity change, appetite change, chills, diaphoresis, fatigue and fever.  HENT:  Negative for congestion, ear discharge, ear pain, rhinorrhea, sore throat and trouble swallowing.   Eyes:  Negative for discharge.  Respiratory:  Positive for cough.   Gastrointestinal:  Negative for diarrhea, nausea and vomiting.  Musculoskeletal:  Negative for myalgias.  Skin:  Negative for rash.  Neurological:  Positive for headaches.  Hematological:  Negative for adenopathy.    Physical Exam Triage Vital Signs ED Triage Vitals  Enc Vitals Group     BP 10/06/20 0904 (!) 156/98     Pulse Rate 10/06/20 0904 88     Resp 10/06/20 0904 16     Temp 10/06/20 0904 98.5 F (36.9 C)     Temp Source 10/06/20 0904 Oral     SpO2 10/06/20 0904 100 %     Weight 10/06/20 0902 230 lb (104.3 kg)     Height 10/06/20 0902 5' 10" (1.778 m)     Head Circumference --      Peak Flow --      Pain Score 10/06/20 0902 1     Pain Loc --      Pain Edu? --      Excl. in [place]? --    No data found.  Updated Vital Signs BP (!) 156/98 (BP Location: Left Arm)   Pulse 88   Temp 98.5 F (36.9 C) (Oral)   Resp 16   Ht 5' 10" (1.778 m)   Wt 230 lb (104.3 kg)   SpO2 100%   BMI 33.00 kg/m   Visual Acuity Right Eye Distance:   Left Eye Distance:   Bilateral Distance:    Right Eye Near:   Left Eye Near:    Bilateral Near:     Physical Exam Physical Exam Vitals signs and nursing note reviewed.  Constitutional:      General: he is not in acute distress.    Appearance: Normal appearance. he is not ill-appearing, toxic-appearing or diaphoretic.  HENT:     Head: Normocephalic.     Right Ear: Tympanic membrane, ear canal and external ear normal.     Left Ear: Tympanic membrane, ear canal and external ear normal.     Nose: Nose normal.     Mouth/Throat:     Mouth: Mucous membranes are moist.  Eyes:     General: No scleral icterus.       Right eye:  No discharge.        Left eye: No  discharge.     Conjunctiva/sclera: Conjunctivae normal.  Neck:     Musculoskeletal: Neck supple. No neck rigidity.  Cardiovascular:     Rate and Rhythm: Normal rate and regular rhythm.     Heart sounds: No murmur.  Pulmonary:     Effort: Pulmonary effort is normal.     Breath sounds: Normal breath sounds.    Musculoskeletal: Normal range of motion.  Lymphadenopathy:     Cervical: No cervical adenopathy.  Skin:    General: Skin is warm and dry.     Coloration: Skin is not jaundiced.     Findings: No rash.  Neurological:     Mental Status: he is alert and oriented to person, place, and time.     Gait: Gait normal.  Psychiatric:        Mood and Affect: Mood normal.        Behavior: Behavior normal.        Thought Content: Thought content normal.        Judgment: Judgment normal.    UC Treatments / Results  Labs (all labs ordered are listed, but only abnormal results are displayed) Labs Reviewed - No data to display  EKG   Radiology No results found.  Procedures Procedures (including critical care time)  Medications Ordered in UC Medications - No data to display  Initial Impression / Assessment and Plan / UC Course  I have reviewed the triage vital signs and the nursing notes. I reviewed his labs from 7/25 and his EGFR is > 60. He had preference to be prescribed Paxlovid and Tussionex as noted.  See instructions.      Final Clinical Impressions(s) / UC Diagnoses   Final diagnoses:  None   Discharge Instructions   None    ED Prescriptions   None    PDMP not reviewed this encounter.   Shelby Mattocks, PA-C 10/06/20 1002    [place], [place], [place] 10/06/20 1043

## 2020-10-06 NOTE — ED Triage Notes (Signed)
Patient states that his wife is COVID+  Patient states that he took a home covid test and was positive.  Patient reports cough and scratchy throat that started yesterday.  Patient denies fevers.

## 2020-10-08 ENCOUNTER — Other Ambulatory Visit: Payer: Managed Care, Other (non HMO)

## 2020-10-08 ENCOUNTER — Ambulatory Visit: Payer: Managed Care, Other (non HMO) | Admitting: Family Medicine

## 2020-10-09 ENCOUNTER — Telehealth: Payer: Self-pay

## 2020-10-09 NOTE — Telephone Encounter (Signed)
Pt dx with + covid on 10/06/20; pt seen at [facility] UC on 10/06/20.now S/T feels like pt is swallowing razor blades when tries to swallow; pt has not eaten of substance since 10/06/20; pt has been trying to drink; pt has been gargling with salt water and using chloraseptic. Pt said having constant dry mouth and hunger pains in abdomen. Pt is presently taking paxlovid but wonders if needs infusion. Pt vomited on 10/08/20 and watery and soft diarrhea on 10/07/20. Pt does not have CP,SOB or dizziness. Pt has prod cough with brown,orange and yellow phlegm; pt is going to go to ED at [facility] for eval and possible testing and IV if dehydrated.sending note to Dr Lorelei Pont and Butch Penny CMA.

## 2020-10-09 NOTE — Telephone Encounter (Signed)
That is a reasonable POC.

## 2020-10-09 NOTE — Telephone Encounter (Signed)
PLEASE NOTE: All timestamps contained within this report are represented as [place] Standard Time. CONFIDENTIALTY NOTICE: This fax transmission is intended only for the addressee. It contains information that is legally privileged, confidential or otherwise protected from use or disclosure. If you are not the intended recipient, you are strictly prohibited from reviewing, disclosing, copying using or disseminating any of this information or taking any action in reliance on or regarding this information. If you have received this fax in error, please notify us immediately by telephone so that we can arrange for its return to [place]. Phone: [phone], Toll-Free: [phone], Fax: [phone] Page: 1 of 2 Call Id: [id number] [place] RECORD AccessNurse Patient Name: Kyle Mercer [facility] [place] Gender: Male DOB: [birth year] Age: 50 Y 64 M 14 D Return Phone Number: [phone] (Primary) Address: City/ State/ [place] [address] Client [facility] Night - Client Client Site [place] Physician Owens Loffler - MD Contact Type Call Who Is Calling Patient / Member / Family / Caregiver Call Type Triage / Clinical Relationship To Patient Self Return Phone Number [phone] (Primary) Chief Complaint Sore Throat Reason for Call Symptomatic / Request for [place] states he tested positive for covid Saturday morning and has been on Paxlovid since then and he is experiencing severe throat pain. Caller states he is wanting to know if there is anything else he can do . Translation No Nurse Assessment Nurse: Lucky Cowboy, RN, Levada Dy Date/Time (Eastern Time): 10/09/2020 9:14:02 AM Confirm and document reason for call. If symptomatic, describe symptoms. ---Caller stated that he is COVID+ and taking Paxlovid; test done on Sat. He has been drinking water ok. He has  been having sore throat. No fever. Does the patient have any new or worsening symptoms? ---Yes Will a triage be completed? ---Yes Related visit to physician within the last 2 weeks? ---Yes Does the PT have any chronic conditions? (i.e. diabetes, asthma, this includes High risk factors for pregnancy, etc.) ---Yes List chronic conditions. ---seasonal allergies, depression, fatty liver Is this a behavioral health or substance abuse call? ---No Guidelines Guideline Title Affirmed Question Affirmed Notes Nurse Date/Time (New [place] Time) COVID-19 - Diagnosed or Suspected [1] Continuous (nonstop) coughing interferes with work or school AND [2] no improvement using cough treatment per Care Advice Dew, RN, Levada Dy 10/09/2020 9:16:50 AM PLEASE NOTE: All timestamps contained within this report are represented as [place] Standard Time. CONFIDENTIALTY NOTICE: This fax transmission is intended only for the addressee. It contains information that is legally privileged, confidential or otherwise protected from use or disclosure. If you are not the intended recipient, you are strictly prohibited from reviewing, disclosing, copying using or disseminating any of this information or taking any action in reliance on or regarding this information. If you have received this fax in error, please notify us immediately by telephone so that we can arrange for its return to [place]. Phone: [phone], Toll-Free: [phone], Fax: [phone] Page: 2 of 2 Call Id: [id number] [place]. Time Eilene Ghazi Time) Disposition Final User 10/09/2020 9:14:17 AM Send To RN Personal Robby Sermon, RN, April 10/09/2020 9:20:09 AM Call PCP within 24 Hours Yes Dew, RN, Marin Shutter Disagree/Comply Comply Caller Understands Yes PreDisposition Go to Urgent Care/Walk-In Clinic Care Advice Given Per Guideline CALL PCP WITHIN 24 HOURS: * You need to discuss this with your doctor (or NP/PA) within the next 24 hours. * IF OFFICE WILL BE OPEN:  Call the office when it  opens tomorrow morning. GENERAL CARE ADVICE FOR COVID-19 SYMPTOMS: * The symptoms are generally treated the same whether you have COVID-19, influenza or some other respiratory virus. * Cough: Use cough drops. * Feeling dehydrated: Drink extra liquids. If the air in your home is dry, use a humidifier. * Fever: For fever over 101 F (38.3 C), take acetaminophen every 4 to 6 hours (Adults 650 mg) OR ibuprofen every 6 to 8 hours (Adults 400 mg). Before taking any medicine, read all the instructions on the package. Do not take aspirin unless your doctor has prescribed it for you. * Muscle aches, headache, and other pains: Often this comes and goes with the fever. Take acetaminophen every 4 to 6 hours (Adults 650 mg) OR ibuprofen every 6 to 8 hours (Adults 400 mg). Before taking any medicine, read all the instructions on the package. * Sore throat: Try throat lozenges, hard candy or warm chicken broth. CALL BACK IF: * Fever over 103 F (39.4 C) * Chest pain or difficulty breathing occurs * You become worse CARE ADVICE given per COVID-19 - DIAGNOSED OR SUSPECTED (Adult) guideline. Referrals REFERRED TO PCP OFFICE

## 2020-10-09 NOTE — Telephone Encounter (Signed)
See previous notes.

## 2020-10-10 ENCOUNTER — Ambulatory Visit
Admission: RE | Admit: 2020-10-10 | Discharge: 2020-10-10 | Disposition: A | Discharge status: Home / Self Care | Payer: Managed Care, Other (non HMO) | Source: Ambulatory Visit | Attending: Physician Assistant | Admitting: Physician Assistant

## 2020-10-10 ENCOUNTER — Other Ambulatory Visit: Payer: Self-pay

## 2020-10-10 ENCOUNTER — Ambulatory Visit (INDEPENDENT_AMBULATORY_CARE_PROVIDER_SITE_OTHER): Payer: Managed Care, Other (non HMO)

## 2020-10-10 VITALS — BP 153/100 | HR 89 | Temp 99.0°F | Resp 18

## 2020-10-10 DIAGNOSIS — U071 COVID-19: Secondary | ICD-10-CM | POA: Diagnosis not present

## 2020-10-10 DIAGNOSIS — R059 Cough, unspecified: Secondary | ICD-10-CM | POA: Diagnosis not present

## 2020-10-10 DIAGNOSIS — J029 Acute pharyngitis, unspecified: Secondary | ICD-10-CM | POA: Diagnosis not present

## 2020-10-10 LAB — GROUP A STREP BY PCR: Group A Strep by PCR: NOT DETECTED

## 2020-10-10 MED ORDER — DEXAMETHASONE SODIUM PHOSPHATE 10 MG/ML IJ SOLN
10.0000 mg | Freq: Once | INTRAMUSCULAR | Status: AC
  Administered 2020-10-10: 10 mg via INTRAMUSCULAR

## 2020-10-10 MED ORDER — LIDOCAINE VISCOUS HCL 2 % MT SOLN: 15.0000 mL | OROMUCOSAL | 0 refills | Status: DC | PRN

## 2020-10-10 NOTE — ED Provider Notes (Signed)
[facility]    CSN: [id number] Arrival date & time: 10/10/20  1053      History   Chief Complaint Chief Complaint  Patient presents with   Sore Throat   Cough   Fever    HPI Kyle Mercer is a 50 y.o. male presenting for continued COVID-19 symptoms.  Patient has had COVID symptoms for the past 4 days.  He reports severe sore throat and painful swallowing, productive cough and fatigue.  He also reports low-grade fevers up to 100.4 the past few days.  Patient seen in the clinic at the onset of symptoms on 10/06/2020 and was prescribed Paxlovid Intestinex.  He has also been taking ibuprofen and Tylenol for his sore throat and using Chloraseptic spray.  Patient states that he has take all these medications to numb his throat enough so that he can eat small amounts of food and drink fluids.  He says that when he has been able to he has "tried water."  Patient has been vaccinated for COVID-19 x4.  He is currently denying any high-grade fevers, body aches, chest pain, breathing difficulty, vomiting or diarrhea.  Patient states that he feels better today than at the onset of symptoms.  He has another day left of the Paxlovid.  Past medical history significant for cirrhosis, hyperlipidemia and sleep apnea.  He has no other complaints.  HPI  Past Medical History:  Diagnosis Date   Abnormal transaminases question cirrhosis, suspect alcohol as cause [id number]   Alcoholic cirrhosis of liver without ascites ([place]) 08/18/2019   Anxiety    Chronic back pain    Chronic neck pain    GERD (gastroesophageal reflux disease)    History of nephrolithiasis    Hyperlipidemia    Sleep apnea    Thrombocytopenia ([place]) 02/03/2019    Patient Active Problem List   Diagnosis Date Noted   Alcoholic cirrhosis of liver without ascites ([place]) 08/18/2019   Liver lesion hepatic subsegment four? 02/03/2019   Thrombocytopenia ([place]) 02/03/2019   SLEEP APNEA 06/25/2009   Anxiety state 06/25/2008    Allergic rhinitis 06/25/2008   GERD 06/25/2008   NEPHROLITHIASIS, HX OF 06/25/2008    Past Surgical History:  Procedure Laterality Date   ESOPHAGOGASTRODUODENOSCOPY  10/2018   ROTATOR CUFF REPAIR Right        Home Medications    Prior to Admission medications   Medication Sig Start Date End Date Taking? Authorizing Provider  lidocaine (XYLOCAINE) 2 % solution Use as directed 15 mLs in the mouth or throat as needed for mouth pain (q2h, swish and spit). 10/10/20  Yes Danton Clap, PA-C  chlorpheniramine-HYDROcodone (TUSSIONEX PENNKINETIC ER) 10-8 MG/5ML SUER Take 5 mLs by mouth 2 (two) times daily. 10/06/20   Rodriguez-Southworth, Sunday Spillers, PA-C  cyclobenzaprine (FLEXERIL) 10 MG tablet Take 1 tablet (10 mg total) by mouth 3 (three) times daily as needed for muscle spasms. 07/01/18   Copland, Frederico Hamman, MD  fexofenadine (ALLEGRA) 180 MG tablet Take 180 mg by mouth at bedtime.    [provider]  fluticasone (FLONASE) 50 MCG/ACT nasal spray PLACE 2 SPRAYS INTO THE NOSE ONCE DAILY. 08/05/20   Copland, Frederico Hamman, MD  naproxen sodium (ALEVE) 220 MG tablet Take 220 mg by mouth daily as needed.    [provider]  nirmatrelvir & ritonavir (PAXLOVID, 300/100,) 20 x 150 MG & 10 x '100MG'$  TBPK As directed per pack for covid infection 10/06/20   Rodriguez-Southworth, Sunday Spillers, PA-C  NON FORMULARY C-PAP machine, use at night  [provider]  sertraline (ZOLOFT) 50 MG tablet TAKE 1 TABLET BY MOUTH EVERY DAY 08/05/20   Copland, Frederico Hamman, MD    Family History Family History  Problem Relation Age of Onset   Diabetes Mother    Heart disease Mother    Diabetes Father    Heart disease Father    Diabetes Brother    Colon cancer Neg Hx    Colon polyps Neg Hx    Esophageal cancer Neg Hx    Pancreatic cancer Neg Hx    Stomach cancer Neg Hx    Liver disease Neg Hx     Social History Social History   Tobacco Use   Smoking status: Former    Years: 7.00    Types: Cigarettes     Quit date: 02/18/2000    Years since quitting: 20.6   Smokeless tobacco: Never  Vaping Use   Vaping Use: Never used  Substance Use Topics   Alcohol use: Yes    Alcohol/week: 21.0 standard drinks    Types: 21 Glasses of wine per week   Drug use: No     Allergies   Cefdinir   Review of Systems Review of Systems  Constitutional:  Positive for appetite change, fatigue and fever.  HENT:  Positive for congestion, rhinorrhea and sore throat. Negative for sinus pressure, sinus pain and trouble swallowing.   Respiratory:  Positive for cough. Negative for shortness of breath.   Cardiovascular:  Negative for chest pain.  Gastrointestinal:  Negative for abdominal pain, diarrhea, nausea and vomiting.  Musculoskeletal:  Negative for myalgias.  Neurological:  Negative for weakness, light-headedness and headaches.  Hematological:  Negative for adenopathy.    Physical Exam Triage Vital Signs ED Triage Vitals  Enc Vitals Group     BP 10/10/20 1113 (!) 153/100     Pulse Rate 10/10/20 1113 89     Resp 10/10/20 1113 18     Temp 10/10/20 1113 99 F (37.2 C)     Temp Source 10/10/20 1113 Oral     SpO2 10/10/20 1113 96 %     Weight --      Height --      Head Circumference --      Peak Flow --      Pain Score 10/10/20 1111 10     Pain Loc --      Pain Edu? --      Excl. in [place]? --    No data found.  Updated Vital Signs BP (!) 153/100 (BP Location: Right Arm)   Pulse 89   Temp 99 F (37.2 C) (Oral) Comment: pt took Ibuprofen at 06:00 am today  Resp 18   SpO2 96%      Physical Exam Vitals and nursing note reviewed.  Constitutional:      General: He is not in acute distress.    Appearance: Normal appearance. He is well-developed. He is not ill-appearing or diaphoretic.  HENT:     Head: Normocephalic and atraumatic.     Nose: Congestion present.     Mouth/Throat:     Mouth: Mucous membranes are moist.     Pharynx: Oropharynx is clear. Uvula midline. Posterior oropharyngeal  erythema present.     Tonsils: No tonsillar abscesses. 1+ on the right. 1+ on the left.  Eyes:     General: No scleral icterus.       Right eye: No discharge.        Left eye: No discharge.  Conjunctiva/sclera: Conjunctivae normal.  Neck:     Thyroid: No thyromegaly.     Trachea: No tracheal deviation.  Cardiovascular:     Rate and Rhythm: Normal rate and regular rhythm.     Heart sounds: Normal heart sounds.  Pulmonary:     Effort: Pulmonary effort is normal. No respiratory distress.     Breath sounds: Normal breath sounds. No wheezing, rhonchi or rales.  Musculoskeletal:     Cervical back: Normal range of motion and neck supple.  Skin:    General: Skin is warm and dry.     Findings: No rash.  Neurological:     General: No focal deficit present.     Mental Status: He is alert. Mental status is at baseline.     Motor: No weakness.     Gait: Gait normal.  Psychiatric:        Mood and Affect: Mood normal.        Behavior: Behavior normal.        Thought Content: Thought content normal.     UC Treatments / Results  Labs (all labs ordered are listed, but only abnormal results are displayed) Labs Reviewed  GROUP A STREP BY PCR    EKG   Radiology DG Chest 2 View  Result Date: 10/10/2020 CLINICAL DATA:  50 year old male with history of chest congestion, cough and fever. COVID positive patient. EXAM: CHEST - 2 VIEW COMPARISON:  No priors. FINDINGS: Lung volumes are normal. No consolidative airspace disease. No pleural effusions. No pneumothorax. No pulmonary nodule or mass noted. Pulmonary vasculature and the cardiomediastinal silhouette are within normal limits. IMPRESSION: No radiographic evidence of acute cardiopulmonary disease. Electronically Signed   By: Vinnie Langton M.D.   On: 10/10/2020 11:57    Procedures Procedures (including critical care time)  Medications Ordered in UC Medications  dexamethasone (DECADRON) injection 10 mg (10 mg Intramuscular Given  10/10/20 1211)    Initial Impression / Assessment and Plan / UC Course  I have reviewed the triage vital signs and the nursing notes.  Pertinent labs & imaging results that were available during my care of the patient were reviewed by me and considered in my medical decision making (see chart for details).  50 year old male with COVID symptoms for 4 days, COVID-positive 4 days ago presenting for severe sore throat.  Also admits to continued cough and congestion.  Vitals are stable today but blood pressure is elevated at 153/100.  He is afebrile.  Exam today is significant for nasal congestion as well as posterior pharyngeal erythema and 1+ tonsillar swelling with erythema.  Chest is clear to auscultation heart regular rate rhythm.  Rapid strep test (PCR) negative today.  Chest x-ray obtained.  Normal chest x-ray.  Advised patient that his severe sore throat is due to COVID-19.  I did offer him dexamethasone in the clinic to help with the swelling and hopefully the pain.  I also sent viscous lidocaine to pharmacy and advised he can switch to Aleve and continue Tylenol.  Reviewed ED precautions.   Final Clinical Impressions(s) / UC Diagnoses   Final diagnoses:  COVID-19  Acute pharyngitis, unspecified etiology     Discharge Instructions      Your strep test was negative. Your chest x-ray was normal. Continue with the Paxilovid.  You have been given an injection of a corticosteroid to help with the throat swelling and pain.  You can take Aleve and Tylenol as well and I have sent viscous lidocaine.  Increase  fluid intake.  This should get better over the next few days.  COVID-19 INFECTION: The incubation period of COVID-19 is approximately 14 days after exposure, with most symptoms developing in roughly 4-5 days. Symptoms may range in severity from mild to critically severe. Roughly 80% of those infected will have mild symptoms. People of any age may become infected with COVID-19 and have  the ability to transmit the virus. The most common symptoms include: fever, fatigue, cough, body aches, headaches, sore throat, nasal congestion, shortness of breath, nausea, vomiting, diarrhea, changes in smell and/or taste.    COURSE OF ILLNESS Some patients may begin with mild disease which can progress quickly into critical symptoms. If your symptoms are worsening please call ahead to the Emergency Department and proceed there for further treatment. Recovery time appears to be roughly 1-2 weeks for mild symptoms and 3-6 weeks for severe disease.   GO IMMEDIATELY TO ER FOR FEVER YOU ARE UNABLE TO GET DOWN WITH TYLENOL, BREATHING PROBLEMS, CHEST PAIN, FATIGUE, LETHARGY, INABILITY TO EAT OR DRINK, ETC  QUARANTINE AND ISOLATION: To help decrease the spread of COVID-19 please remain isolated if you have COVID infection or are highly suspected to have COVID infection. This means -stay home and isolate to one room in the home if you live with others. Do not share a bed or bathroom with others while ill, sanitize and wipe down all countertops and keep common areas clean and disinfected. Stay home for 5 days. If you have no symptoms or your symptoms are resolving after 5 days, you can leave your house. Continue to wear a mask around others for 5 additional days. If you have been in close contact (within 6 feet) of someone diagnosed with COVID 19, you are advised to quarantine in your home for 14 days as symptoms can develop anywhere from 2-14 days after exposure to the virus. If you develop symptoms, you  must isolate.  Most current guidelines for COVID after exposure -unvaccinated: isolate 5 days and strict mask use x 5 days. Test on day 5 is possible -vaccinated: wear mask x 10 days if symptoms do not develop -You do not necessarily need to be tested for COVID if you have + exposure and  develop symptoms. Just isolate at home x10 days from symptom onset During this global pandemic, CDC advises to practice  social distancing, try to stay at least 48f away from others at all times. Wear a face covering. Wash and sanitize your hands regularly and avoid going anywhere that is not necessary.  KEEP IN MIND THAT THE COVID TEST IS NOT 100% ACCURATE AND YOU SHOULD STILL DO EVERYTHING TO PREVENT POTENTIAL SPREAD OF VIRUS TO OTHERS (WEAR MASK, WEAR GLOVES, [place] Lake[place]S AND SANITIZE REGULARLY). IF INITIAL TEST IS NEGATIVE, THIS MAY NOT MEAN YOU ARE DEFINITELY NEGATIVE. MOST ACCURATE TESTING IS DONE 5-7 DAYS AFTER EXPOSURE.   It is not advised by CDC to get re-tested after receiving a positive COVID test since you can still test positive for weeks to months after you have already cleared the virus.   *If you have not been vaccinated for COVID, I strongly suggest you consider getting vaccinated as long as there are no contraindications.       ED Prescriptions     Medication Sig Dispense Auth. Provider   lidocaine (XYLOCAINE) 2 % solution Use as directed 15 mLs in the mouth or throat as needed for mouth pain (q2h, swish and spit). 100 mL EDanton Clap PA-C  PDMP not reviewed this encounter.   Danton Clap, PA-C 10/10/20 1234

## 2020-10-10 NOTE — Discharge Instructions (Addendum)
Your strep test was negative. Your chest x-ray was normal. Continue with the Paxilovid.  You have been given an injection of a corticosteroid to help with the throat swelling and pain.  You can take Aleve and Tylenol as well and I have sent viscous lidocaine.  Increase fluid intake.  This should get better over the next few days.  COVID-19 INFECTION: The incubation period of COVID-19 is approximately 14 days after exposure, with most symptoms developing in roughly 4-5 days. Symptoms may range in severity from mild to critically severe. Roughly 80% of those infected will have mild symptoms. People of any age may become infected with COVID-19 and have the ability to transmit the virus. The most common symptoms include: fever, fatigue, cough, body aches, headaches, sore throat, nasal congestion, shortness of breath, nausea, vomiting, diarrhea, changes in smell and/or taste.    COURSE OF ILLNESS Some patients may begin with mild disease which can progress quickly into critical symptoms. If your symptoms are worsening please call ahead to the Emergency Department and proceed there for further treatment. Recovery time appears to be roughly 1-2 weeks for mild symptoms and 3-6 weeks for severe disease.   GO IMMEDIATELY TO ER FOR FEVER YOU ARE UNABLE TO GET DOWN WITH TYLENOL, BREATHING PROBLEMS, CHEST PAIN, FATIGUE, LETHARGY, INABILITY TO EAT OR DRINK, ETC  QUARANTINE AND ISOLATION: To help decrease the spread of COVID-19 please remain isolated if you have COVID infection or are highly suspected to have COVID infection. This means -stay home and isolate to one room in the home if you live with others. Do not share a bed or bathroom with others while ill, sanitize and wipe down all countertops and keep common areas clean and disinfected. Stay home for 5 days. If you have no symptoms or your symptoms are resolving after 5 days, you can leave your house. Continue to wear a mask around others for 5 additional  days. If you have been in close contact (within 6 feet) of someone diagnosed with COVID 19, you are advised to quarantine in your home for 14 days as symptoms can develop anywhere from 2-14 days after exposure to the virus. If you develop symptoms, you  must isolate.  Most current guidelines for COVID after exposure -unvaccinated: isolate 5 days and strict mask use x 5 days. Test on day 5 is possible -vaccinated: wear mask x 10 days if symptoms do not develop -You do not necessarily need to be tested for COVID if you have + exposure and  develop symptoms. Just isolate at home x10 days from symptom onset During this global pandemic, CDC advises to practice social distancing, try to stay at least 60f away from others at all times. Wear a face covering. Wash and sanitize your hands regularly and avoid going anywhere that is not necessary.  KEEP IN MIND THAT THE COVID TEST IS NOT 100% ACCURATE AND YOU SHOULD STILL DO EVERYTHING TO PREVENT POTENTIAL SPREAD OF VIRUS TO OTHERS (WEAR MASK, WEAR GLOVES, [place]S AND SANITIZE REGULARLY). IF INITIAL TEST IS NEGATIVE, THIS MAY NOT MEAN YOU ARE DEFINITELY NEGATIVE. MOST ACCURATE TESTING IS DONE 5-7 DAYS AFTER EXPOSURE.   It is not advised by CDC to get re-tested after receiving a positive COVID test since you can still test positive for weeks to months after you have already cleared the virus.   *If you have not been vaccinated for COVID, I strongly suggest you consider getting vaccinated as long as there are no contraindications.

## 2020-10-10 NOTE — ED Notes (Signed)
Patient transported to X-ray 

## 2020-10-10 NOTE — ED Triage Notes (Signed)
Pt presents today with continued sore throat with chest congestion and cough x 4 days. He does report low grade temp yesterday of 100.4. He was seen here and placed on Paxlovid (one days dose left).

## 2020-10-17 ENCOUNTER — Telehealth: Payer: Self-pay

## 2020-10-17 NOTE — Telephone Encounter (Addendum)
Mr. Kyle Mercer notified as instructed by telephone.  Patient states he did have a negative test prior to testing positive again.  He knows you can have a rebound covid after paxlovid.  He wife just got over covid as well. He ask if they needed to still isolate from each other.  I advised at this point the do not need to isolate from each other.  Patient states understanding.

## 2020-10-17 NOTE — Telephone Encounter (Signed)
Pt said he finished Paxlovid on 10/11/20 on 10/15/20 pt felt very fatigued and slept for 12 hours; on 10/16/20 pt had runny nose and constant cough that goes from dry cough to prod cough with clear phlegm; pt has H/A with pain level of 2 today but pt said more like sinus pressure behind his eyes. No fever,SOB,Diarrhea or vomiting and no S/T this time. Pt tested home covid test this morning which was +. Pt wants to know if needs to start back on Paxlovid or what to do. Pt request cb after note reviewed by DR Copland. [facility]. See 10/09/20 phone note also. Sending note to Dr Lorelei Pont and Butch Penny CMA. Sending teams to Butch Penny also.

## 2020-10-17 NOTE — Telephone Encounter (Signed)
Paxlovid beyond 5 days does not help at all.  It is very, very unlikely that he would get Covid again so soon - I am not sure it is even possible.  The tests often stay positive for a long time.  Prolonged symptoms are extremely common in covid - they can last weeks to months.  I would keep drinking plenty of liquids, eating bland foods, and get plenty of rest.

## 2020-10-25 ENCOUNTER — Other Ambulatory Visit (INDEPENDENT_AMBULATORY_CARE_PROVIDER_SITE_OTHER): Payer: Managed Care, Other (non HMO)

## 2020-10-25 ENCOUNTER — Other Ambulatory Visit: Payer: Self-pay

## 2020-10-25 DIAGNOSIS — D696 Thrombocytopenia, unspecified: Secondary | ICD-10-CM | POA: Diagnosis not present

## 2020-10-25 NOTE — Addendum Note (Signed)
Addended by: Ellamae Sia on: 10/25/2020 09:00 AM   Modules accepted: Orders

## 2020-10-26 LAB — CBC WITH DIFFERENTIAL/PLATELET
Absolute Monocytes: 456 cells/uL (ref 200–950)
Basophils Absolute: 38 cells/uL (ref 0–200)
Basophils Relative: 0.8 %
Eosinophils Absolute: 108 cells/uL (ref 15–500)
Eosinophils Relative: 2.3 %
HCT: 41.1 % (ref 38.5–50.0)
Hemoglobin: 14.5 g/dL (ref 13.2–17.1)
Lymphs Abs: 1246 cells/uL (ref 850–3900)
MCH: 33.8 pg — ABNORMAL HIGH (ref 27.0–33.0)
MCHC: 35.3 g/dL (ref 32.0–36.0)
MCV: 95.8 fL (ref 80.0–100.0)
MPV: 9.2 fL (ref 7.5–12.5)
Monocytes Relative: 9.7 %
Neutro Abs: 2853 cells/uL (ref 1500–7800)
Neutrophils Relative %: 60.7 %
Platelets: 100 10*3/uL — ABNORMAL LOW (ref 140–400)
RBC: 4.29 10*6/uL (ref 4.20–5.80)
RDW: 12.7 % (ref 11.0–15.0)
Total Lymphocyte: 26.5 %
WBC: 4.7 10*3/uL (ref 3.8–10.8)

## 2020-10-26 LAB — PATHOLOGIST SMEAR REVIEW

## 2020-11-13 ENCOUNTER — Ambulatory Visit: Payer: Managed Care, Other (non HMO)

## 2020-11-13 ENCOUNTER — Other Ambulatory Visit: Payer: Self-pay

## 2020-11-13 ENCOUNTER — Ambulatory Visit (INDEPENDENT_AMBULATORY_CARE_PROVIDER_SITE_OTHER): Payer: Managed Care, Other (non HMO)

## 2020-11-13 DIAGNOSIS — Z23 Encounter for immunization: Secondary | ICD-10-CM | POA: Diagnosis not present

## 2020-12-21 ENCOUNTER — Other Ambulatory Visit: Payer: Self-pay | Admitting: Family Medicine

## 2021-01-15 IMAGING — US US ABDOMEN COMPLETE
1 series · 14 of 25 positions shown · non-contrast
Comparison: None.

CLINICAL DATA: Nausea and vomiting.  Elevated LFTs.

EXAM:
ABDOMEN ULTRASOUND COMPLETE

[Series 1: us abdomen complete · 0.20mm/px · 14 of 96 slices shown]
[im 1/96]
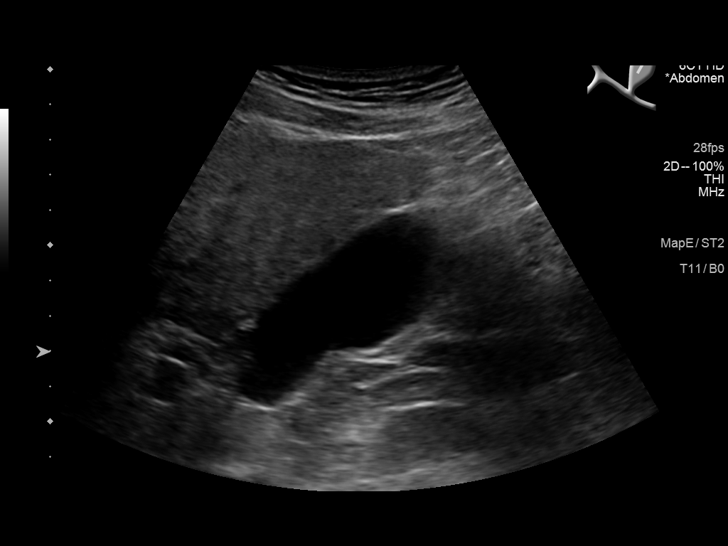
[im 8/96]
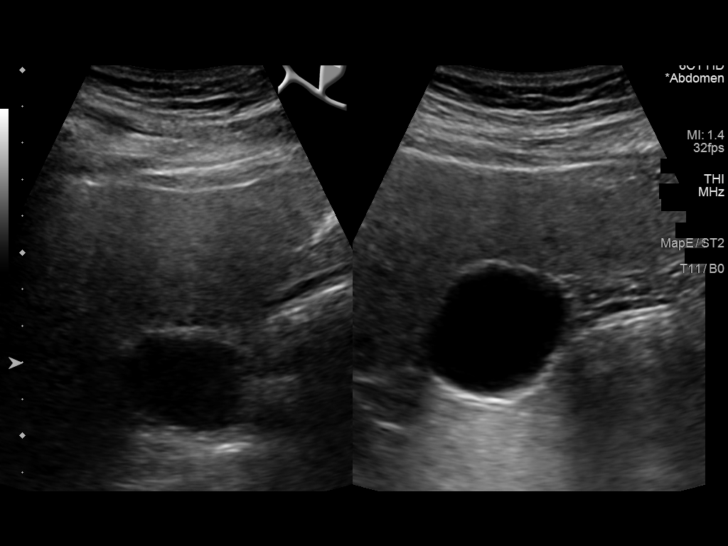
[im 16/96]
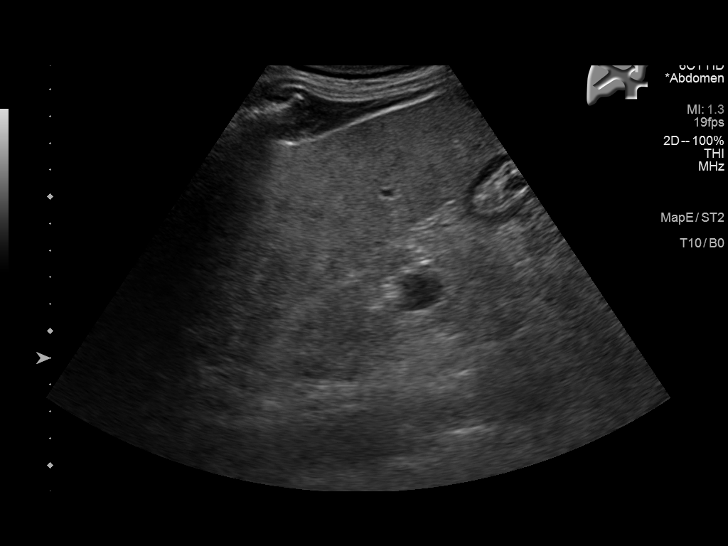
[im 24/96]
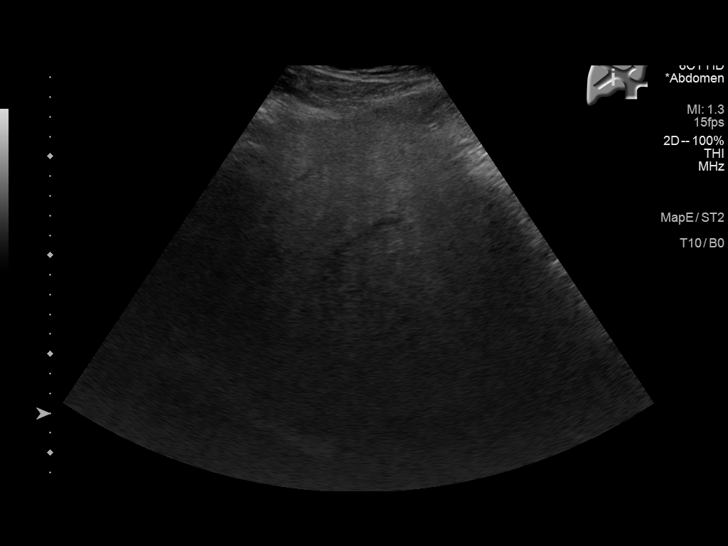
[im 32/96]
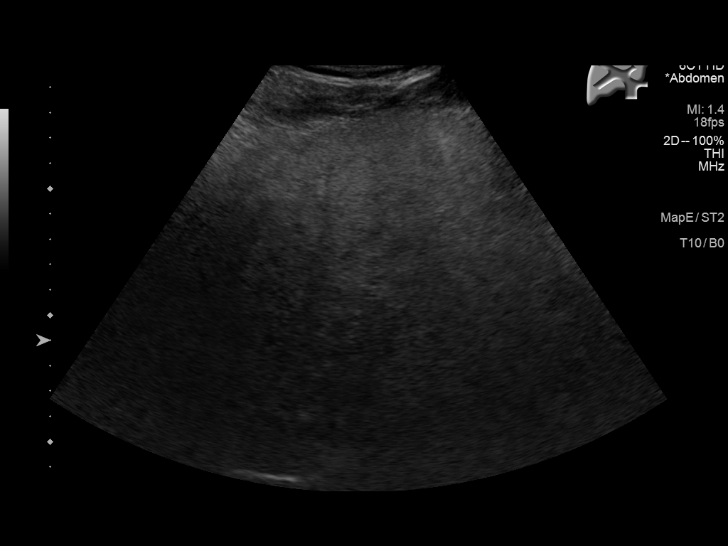
[im 36/96]
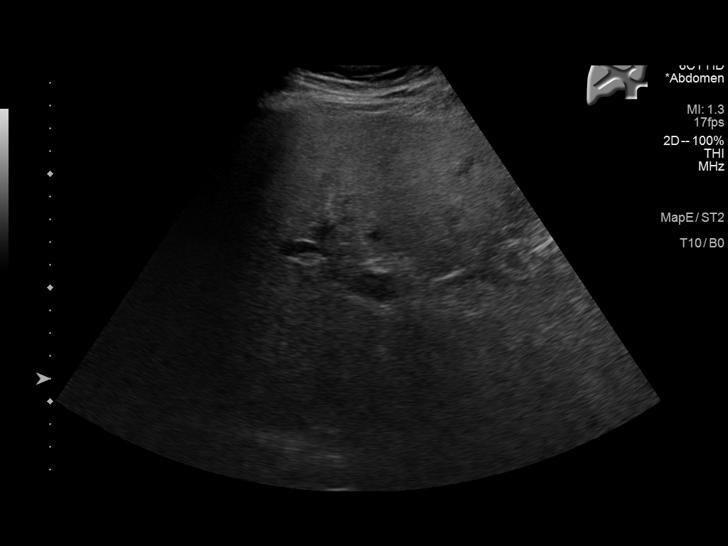
[im 44/96]
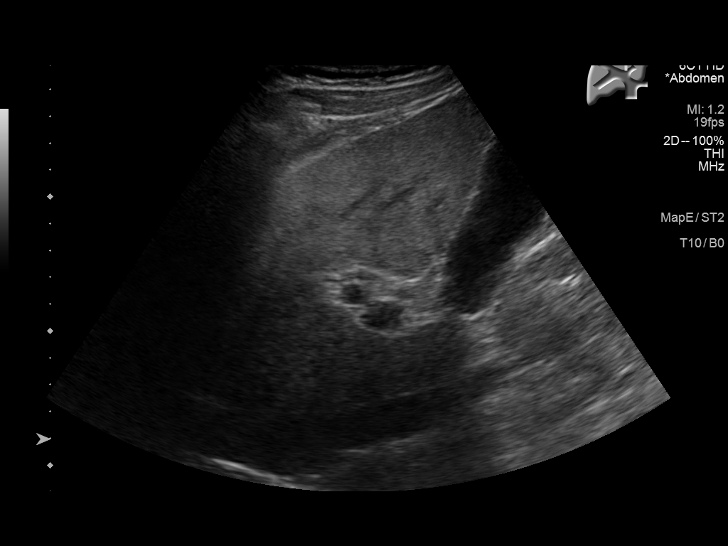
[im 52/96]
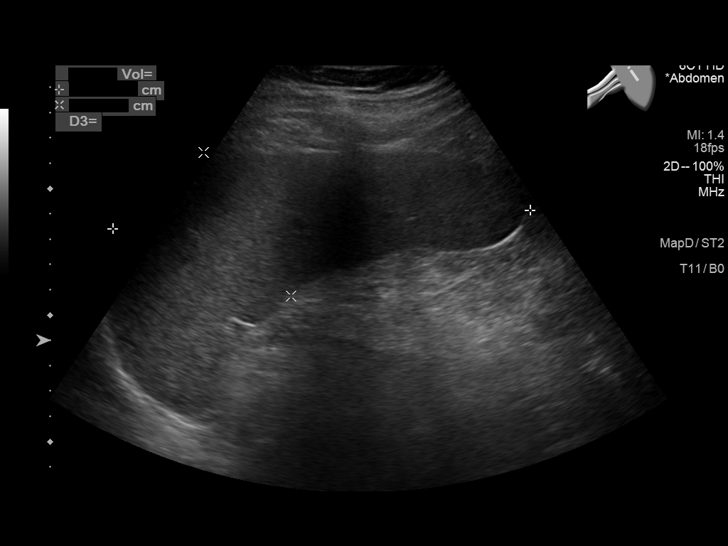
[im 60/96]
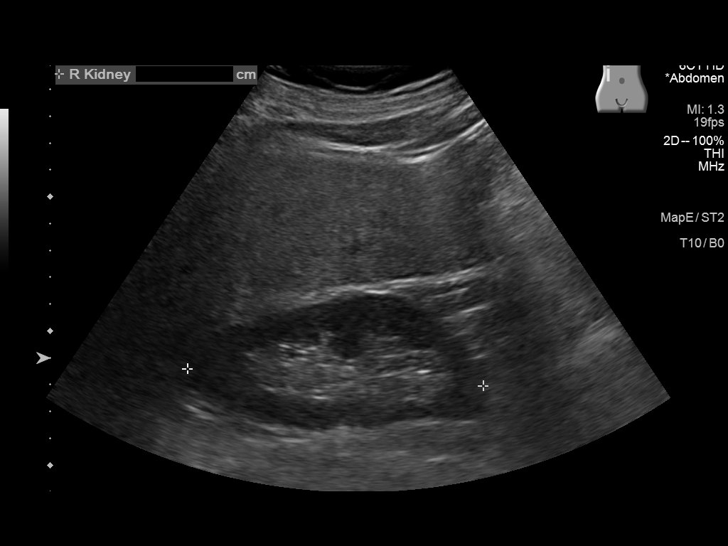
[im 64/96]
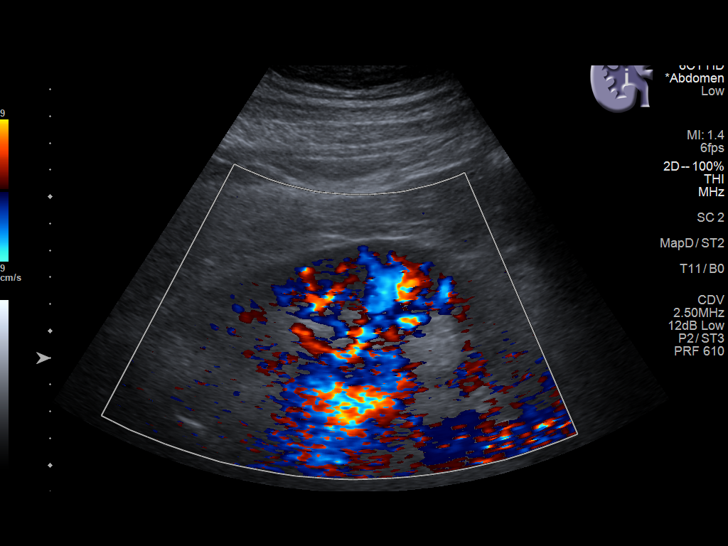
[im 72/96]
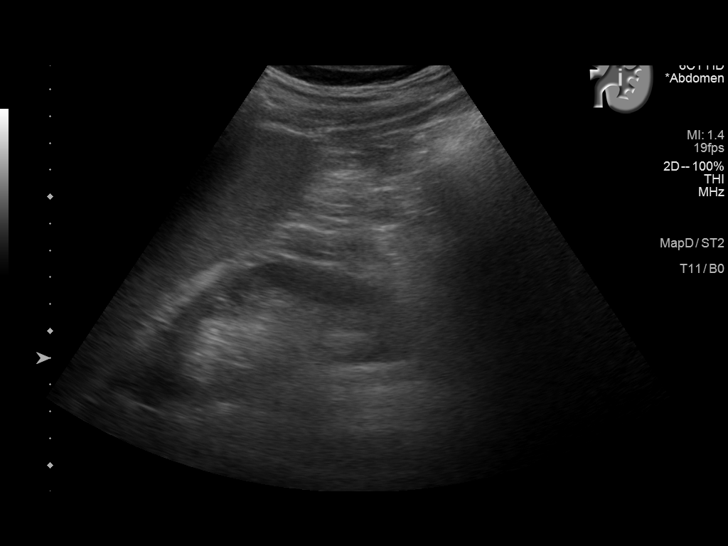
[im 80/96]
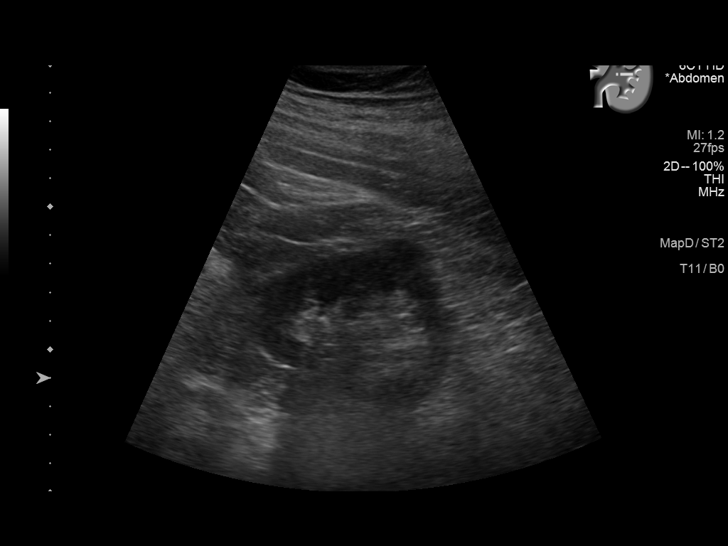
[im 88/96]
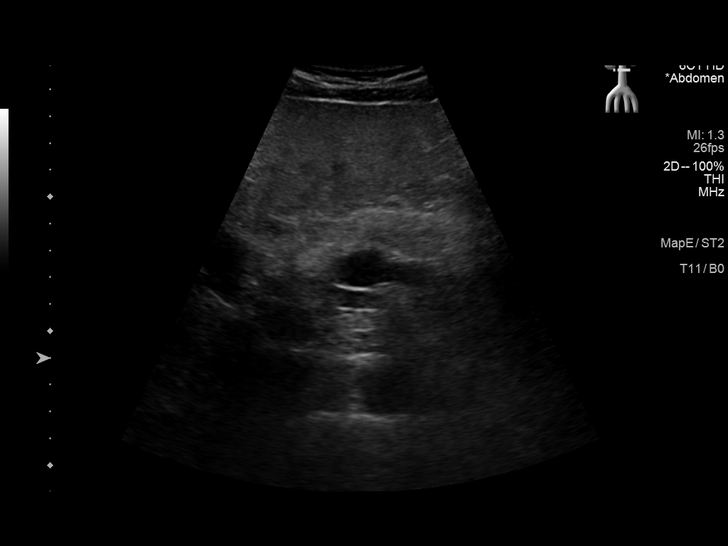
[im 96/96]
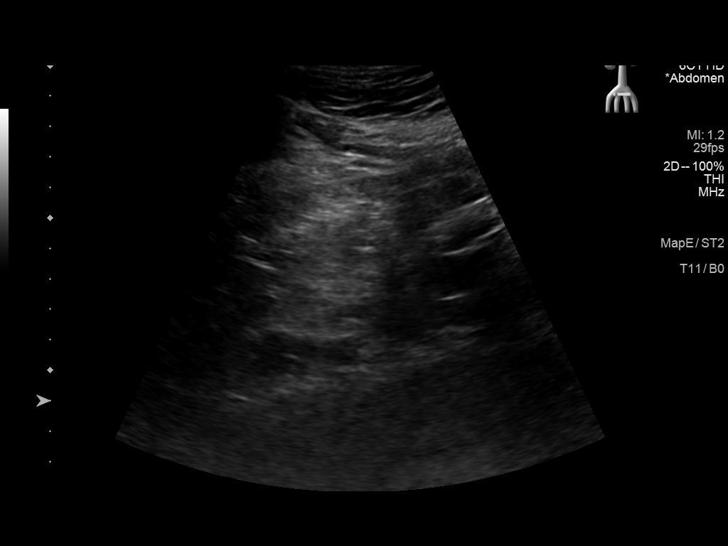

[14 of 25 positions shown; findings below may reference images not displayed]

FINDINGS: Gallbladder: No gallstones or wall thickening visualized. No
sonographic Murphy sign noted by sonographer.

Common bile duct: Diameter: 5 mm, normal.

Liver: No focal lesion identified. Diffusely increased in
parenchymal echogenicity. Portal vein is patent on color Doppler
imaging with normal direction of blood flow towards the liver.

IVC: No abnormality visualized.

Pancreas: Not well visualized due to overlying bowel gas.

Spleen: Mild-to-moderate splenomegaly with estimated splenic volume
of 882 mL.

Right Kidney: Length: 11.9 cm. Echogenicity within normal limits. No
mass or hydronephrosis visualized.

Left Kidney: Length: 11.7 cm. Echogenicity within normal limits. No
mass or hydronephrosis visualized.

Abdominal aorta: No aneurysm visualized.

Other findings: None.
IMPRESSION: 1. Diffusely increased hepatic parenchymal echogenicity as can be
seen with steatosis or underlying liver disease.
2. Mild to moderate splenomegaly.

## 2021-03-10 IMAGING — MR MR ABDOMEN WO/W CM
9 of 18 series · 19 of 48 positions shown · IV contrast (gadavist)
Comparison: CT abdomen and pelvis of 12/30/2018
COMPARISON: CT abdomen and pelvis of 12/30/2018

Addendum:
CLINICAL DATA: History of liver lesions seen on previous CT
evaluation.

EXAM:
MRI ABDOMEN WITHOUT AND WITH CONTRAST
TECHNIQUE: Multiplanar multisequence MR imaging of the abdomen was performed
both before and after the administration of intravenous contrast.
CONTRAST:  10mL GADAVIST GADOBUTROL 1 MMOL/ML IV SOLN

[Series 3: T2 fat-sat · axial · 5.0mm · 0.78mm/px · z∈[-145,+110]mm · 3 of 52 slices shown]
[im 1/52]
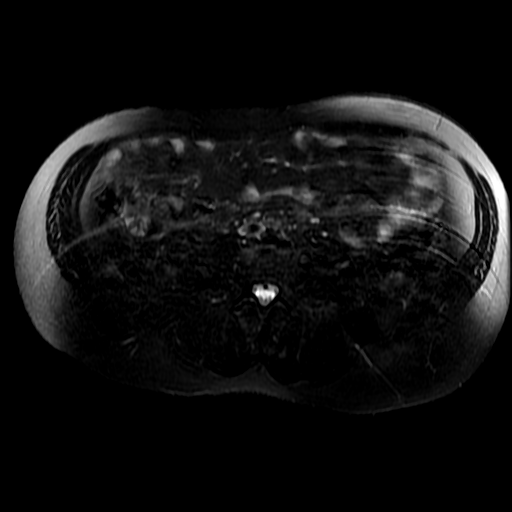
[im 26/52]
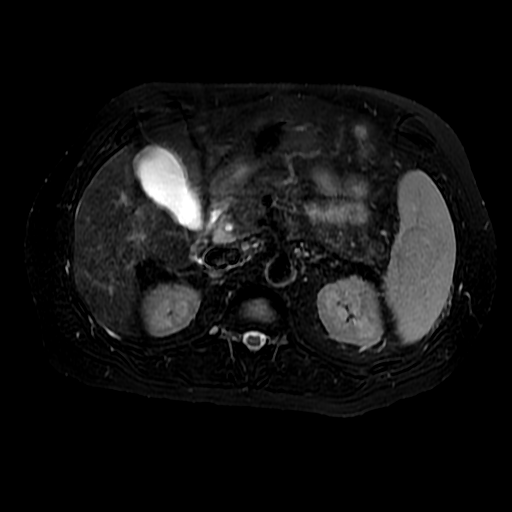
[im 52/52]
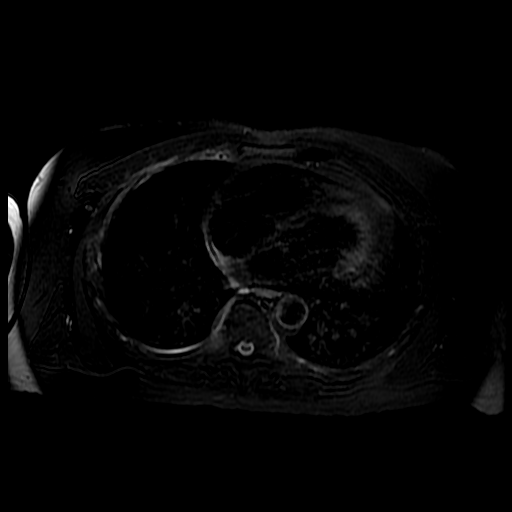

[Series 4: DWI b500 · axial · 6.0mm · 1.56mm/px · z∈[-146,+127]mm · 2 of 72 slices shown]
[im 1/72]
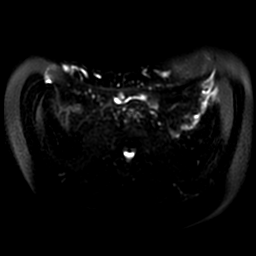
[im 72/72]
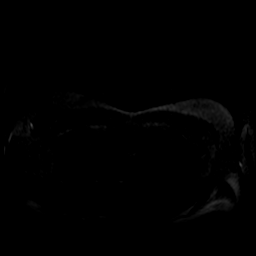

[Series 5: T2 · axial · 5.0mm · 0.78mm/px · z∈[-147,+128]mm · 2 of 56 slices shown (1 of 2)]
[im 1/56]
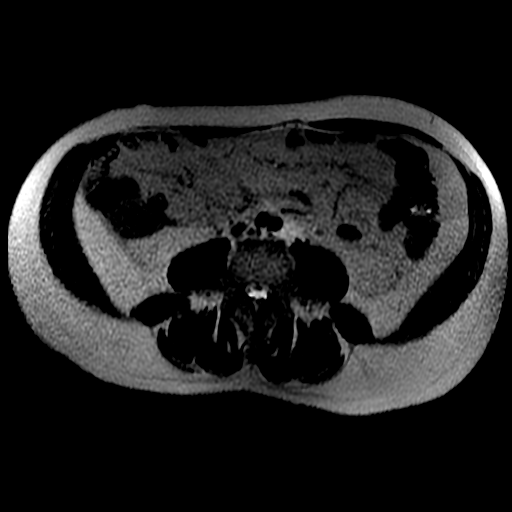
[im 56/56]
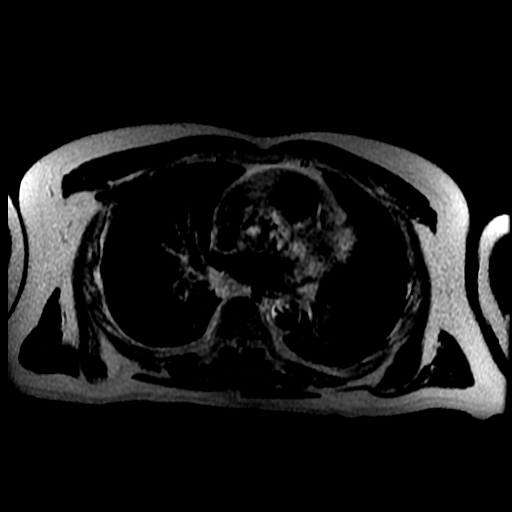

[Series 6: T2 · coronal · 5.0mm · 0.78mm/px · 2 of 49 slices shown (2 of 2)]
[im 1/49]
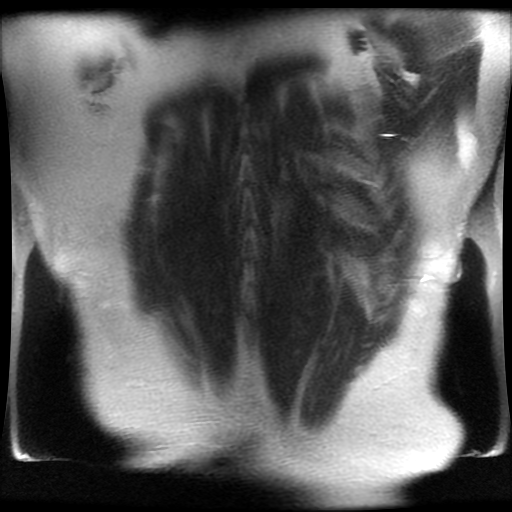
[im 49/49]
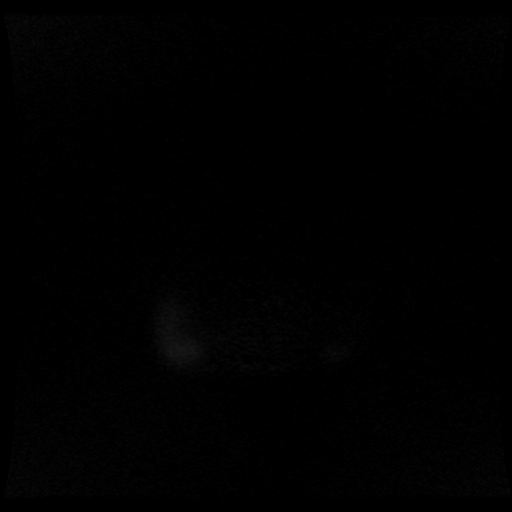

[Series 7: bSSFP · axial · 5.0mm · 0.78mm/px · z∈[-147,+128]mm · 2 of 56 slices shown]
[im 1/56]
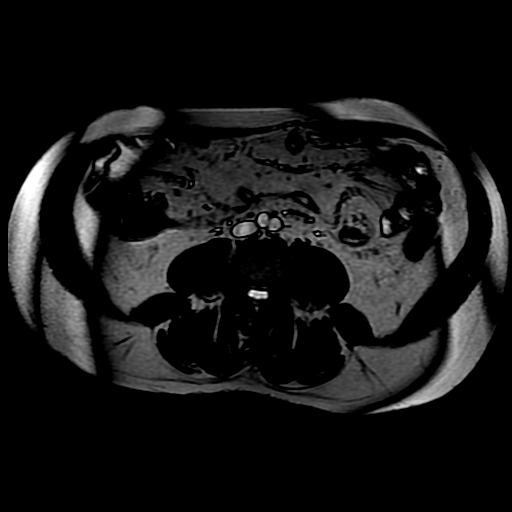
[im 56/56]
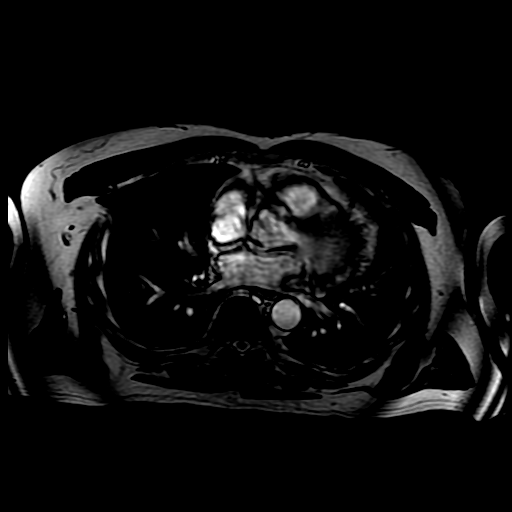

[Series 10: ax dualecho bh · axial · 5.0mm · 0.78mm/px · z∈[-156,+99]mm · 3 of 104 slices shown]
[im 1/104]
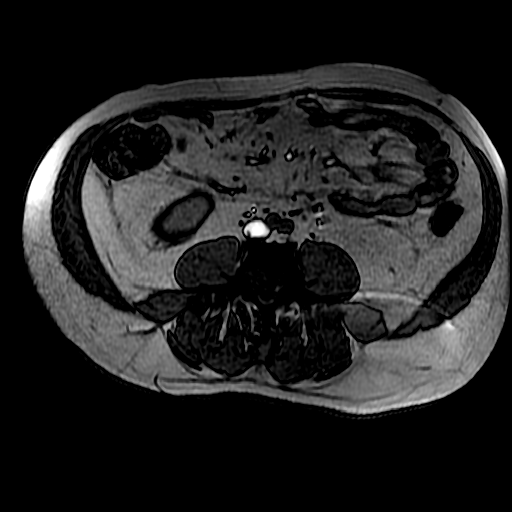
[im 52/104]
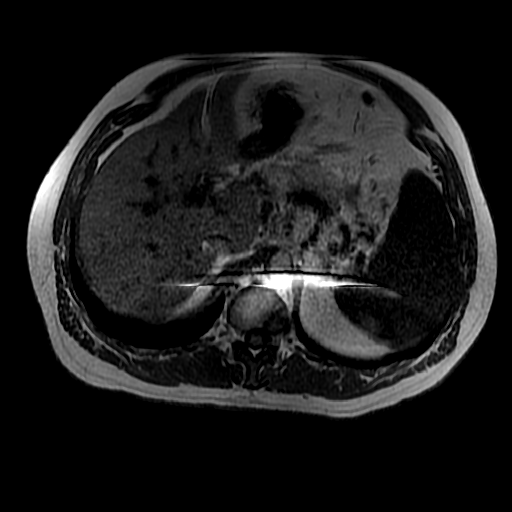
[im 104/104]
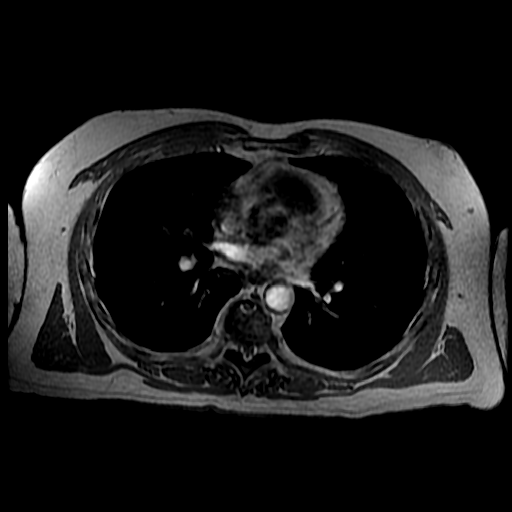

[Series 400: DWI · axial · 6.0mm · 1.56mm/px · 1 of 36 slices shown]
[im 1/36]
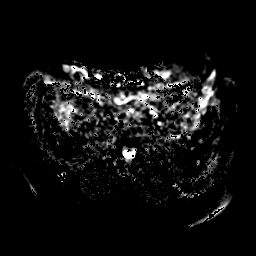

[Series 1100: T1 dynamic · axial · 5.8mm · 0.78mm/px · z∈[-163,+113]mm · 3 of 96 slices shown (1 of 2)]
[im 1/96]
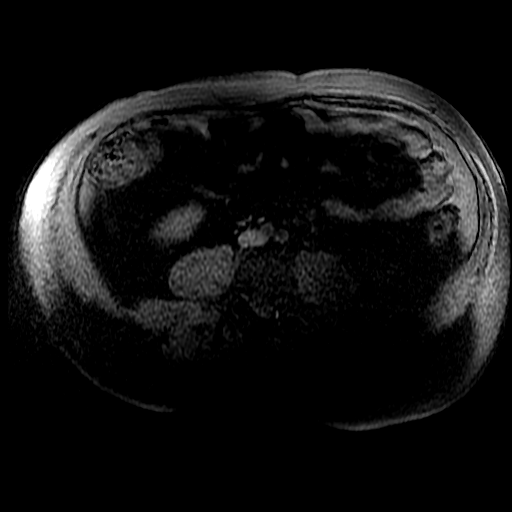
[im 48/96]
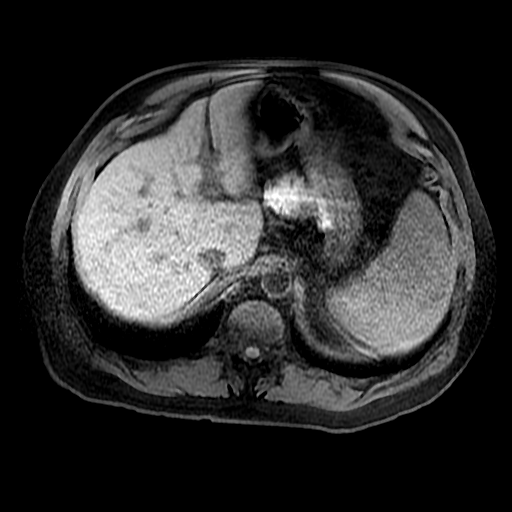
[im 96/96]
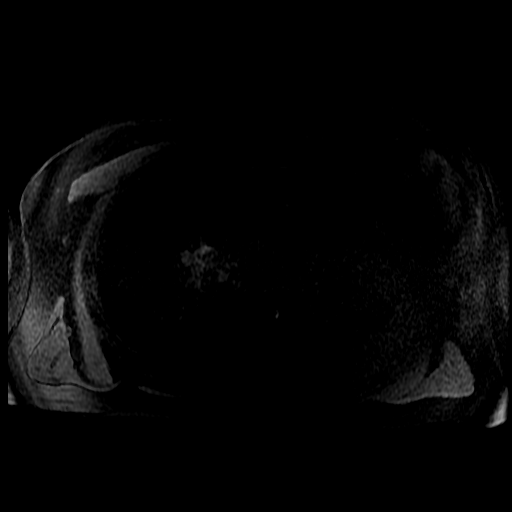

[Series 1101: T1 dynamic · axial · 5.8mm · 0.78mm/px · 1 of 96 slices shown (2 of 2)]
[im 1/96]
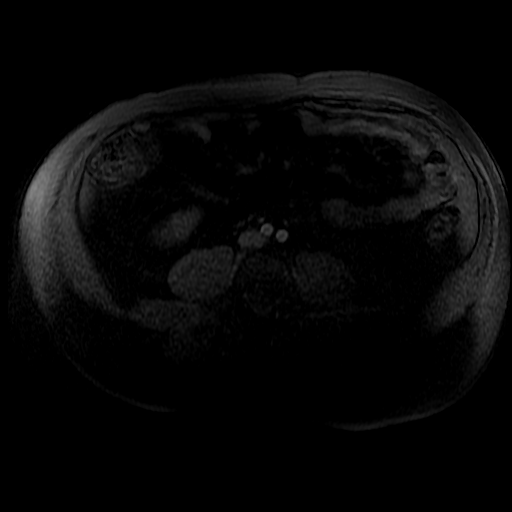

[19 of 48 positions shown; findings below may reference images not displayed]

FINDINGS: Lower chest: No acute findings.

Hepatobiliary: Lobular hepatic contours. Some fissural widening with
mild splenic enlargement. Caudate is mildly large. Area of concern
in the right hemi liver showed mild heterogeneity is in the
periphery, perhaps this represents perfusion all changes related to
a small portal to hepatic venous shunt in this area. There is no
suspicious focal lesion.

Area of subtle intrinsic T1 hyperintensity anterior to the portal
structures in the medial section of the left hepatic lobe likely
represents fatty sparing.

Pancreas:  Pancreas is normal.

Spleen:  Mildly enlarged. Similar to prior study.

Adrenals/Urinary Tract: No masses identified. No evidence of
hydronephrosis.

Stomach/Bowel: Normal to the extent visualized. Pelvic bowel loops
are not imaged.

Vascular/Lymphatic: Vascular structures are patent. No sign
adenopathy aside from some mildly enlarged

Other:  None.

Musculoskeletal: No suspicious bone lesions identified.
IMPRESSION: 1. No focal lesion in the liver with suspicious features. Areas of
fatty sparing are noted. Heterogeneity is presumed to represent
sequela of portal to hepatic venous shunting in this location.
2. Mild splenomegaly.
3. Perigastric varices raise the question of early sequela of portal
hypertension. Continued clinical correlation for laboratory and
clinical evidence of liver disease may be helpful. Is is

ADDENDUM:
1. In and out of phase T1 weighted gradient echo sequences which
were not available initially are now available on PACS. The
sequences confirm the presence of steatosis and fatty sparing.
Hepatic subsegment IV an area of presumed fatty sparing shows a
question of differential enhancement. For this reason and given
underlying liver disease would suggest a 3-6 month follow-up
assessment with MRI.
2. The area of concern initially raised in the inferior right
hepatic lobe likely relates to portal to patent venous shunting in
this area.

*** End of Addendum ***
FINDINGS: Lower chest: No acute findings.

Hepatobiliary: Lobular hepatic contours. Some fissural widening with
mild splenic enlargement. Caudate is mildly large. Area of concern
in the right hemi liver showed mild heterogeneity is in the
periphery, perhaps this represents perfusion all changes related to
a small portal to hepatic venous shunt in this area. There is no
suspicious focal lesion.

Area of subtle intrinsic T1 hyperintensity anterior to the portal
structures in the medial section of the left hepatic lobe likely
represents fatty sparing.

Pancreas:  Pancreas is normal.

Spleen:  Mildly enlarged. Similar to prior study.

Adrenals/Urinary Tract: No masses identified. No evidence of
hydronephrosis.

Stomach/Bowel: Normal to the extent visualized. Pelvic bowel loops
are not imaged.

Vascular/Lymphatic: Vascular structures are patent. No sign
adenopathy aside from some mildly enlarged

Other:  None.

Musculoskeletal: No suspicious bone lesions identified.
IMPRESSION: 1. No focal lesion in the liver with suspicious features. Areas of
fatty sparing are noted. Heterogeneity is presumed to represent
sequela of portal to hepatic venous shunting in this location.
2. Mild splenomegaly.
3. Perigastric varices raise the question of early sequela of portal
hypertension. Continued clinical correlation for laboratory and
clinical evidence of liver disease may be helpful. Is is

## 2021-04-14 ENCOUNTER — Ambulatory Visit
Admission: EM | Admit: 2021-04-14 | Discharge: 2021-04-14 | Disposition: A | Discharge status: Home / Self Care | Payer: Managed Care, Other (non HMO)

## 2021-04-14 DIAGNOSIS — B360 Pityriasis versicolor: Secondary | ICD-10-CM | POA: Diagnosis not present

## 2021-04-14 MED ORDER — FLUCONAZOLE 150 MG PO TABS: 300.0000 mg | ORAL_TABLET | Freq: Every day | ORAL | 0 refills | Status: DC

## 2021-04-14 MED ORDER — FLUCONAZOLE 150 MG PO TABS: 300.0000 mg | ORAL_TABLET | ORAL | 0 refills | Status: AC

## 2021-04-14 NOTE — ED Triage Notes (Signed)
Patient is here for "Rash on back, maybe moving to chest". Some itchiness last night. Note: Recent issues with constipation, but improved. "Wife noticed rash when getting in shower".

## 2021-04-14 NOTE — ED Provider Notes (Signed)
[facility]    CSN: [id number] Arrival date & time: 04/14/21  1057      History   Chief Complaint Chief Complaint  Patient presents with   Rash    HPI Kyle Mercer is a 51 y.o. male.   HPI  51 year old male here for evaluation of rash.  Patient reports that he had some constipation yesterday and consist of hard painful bowel movements but then those resolved and returned to diarrhea and he had no other symptoms.  His wife noticed a rash on his back last night when he was getting in the shower and he is noticed some lesions on his anterior shoulders.  He states the rash typically does not itch and this he thinks about it and then he may get a little bit of itching and he denies any pain.  He states the rash is all across his back.  Past Medical History:  Diagnosis Date   Abnormal transaminases question cirrhosis, suspect alcohol as cause 82/95/6213   Alcoholic cirrhosis of liver without ascites ([place]) 08/18/2019   Anxiety    Chronic back pain    Chronic neck pain    GERD (gastroesophageal reflux disease)    History of nephrolithiasis    Hyperlipidemia    Sleep apnea    Thrombocytopenia ([place]) 02/03/2019    Patient Active Problem List   Diagnosis Date Noted   Alcoholic cirrhosis of liver without ascites ([place]) 08/18/2019   Liver lesion hepatic subsegment four? 02/03/2019   Thrombocytopenia ([place]) 02/03/2019   SLEEP APNEA 06/25/2009   Anxiety state 06/25/2008   Allergic rhinitis 06/25/2008   GERD 06/25/2008   NEPHROLITHIASIS, HX OF 06/25/2008    Past Surgical History:  Procedure Laterality Date   ESOPHAGOGASTRODUODENOSCOPY  10/2018   ROTATOR CUFF REPAIR Right        Home Medications    Prior to Admission medications   Medication Sig Start Date End Date Taking? Authorizing Provider  fluticasone (FLONASE) 50 MCG/ACT nasal spray 2 sprays in each nostril   Yes [provider]  sertraline (ZOLOFT) 50 MG tablet TAKE 1 TABLET BY MOUTH EVERY  DAY Patient taking differently: Take 25 mg by mouth daily. TAKE 1/2 TABLET BY MOUTH EVERY DAY 08/05/20  Yes Copland, Frederico Hamman, MD  fluconazole (DIFLUCAN) 150 MG tablet Take 2 tablets (300 mg total) by mouth once a week for 2 doses. 04/14/21 04/22/21  Margarette Canada, NP    Family History Family History  Problem Relation Age of Onset   Diabetes Mother    Heart disease Mother    Diabetes Father    Heart disease Father    Diabetes Brother    Colon cancer Neg Hx    Colon polyps Neg Hx    Esophageal cancer Neg Hx    Pancreatic cancer Neg Hx    Stomach cancer Neg Hx    Liver disease Neg Hx     Social History Social History   Tobacco Use   Smoking status: Former    Years: 7.00    Types: Cigarettes    Quit date: 02/18/2000    Years since quitting: 21.1   Smokeless tobacco: Never  Vaping Use   Vaping Use: Never used  Substance Use Topics   Alcohol use: Yes    Alcohol/week: 21.0 standard drinks    Types: 21 Glasses of wine per week    Comment: Occ.   Drug use: No     Allergies   Cefdinir   Review of Systems Review of  Systems  Gastrointestinal:  Positive for constipation.  Skin:  Positive for rash.  Hematological: Negative.   Psychiatric/Behavioral: Negative.      Physical Exam Triage Vital Signs ED Triage Vitals  Enc Vitals Group     BP 04/14/21 1107 134/83     Pulse Rate 04/14/21 1107 88     Resp 04/14/21 1107 18     Temp 04/14/21 1107 98.4 F (36.9 C)     Temp Source 04/14/21 1107 Oral     SpO2 04/14/21 1107 98 %     Weight 04/14/21 1103 225 lb (102.1 kg)     Height 04/14/21 1103 5\' 9"  (1.753 m)     Head Circumference --      Peak Flow --      Pain Score 04/14/21 1103 0     Pain Loc --      Pain Edu? --      Excl. in [place]? --    No data found.  Updated Vital Signs BP 134/83 (BP Location: Left Arm)    Pulse 88    Temp 98.4 F (36.9 C) (Oral)    Resp 18    Ht 5\' 9"  (1.753 m)    Wt 225 lb (102.1 kg)    SpO2 98%    BMI 33.23 kg/m   Visual Acuity Right Eye  Distance:   Left Eye Distance:   Bilateral Distance:    Right Eye Near:   Left Eye Near:    Bilateral Near:     Physical Exam Vitals and nursing note reviewed.  Constitutional:      Appearance: Normal appearance. He is not ill-appearing.  HENT:     Head: Normocephalic and atraumatic.  Skin:    General: Skin is warm and dry.     Capillary Refill: Capillary refill takes less than 2 seconds.     Findings: Rash present.  Neurological:     General: No focal deficit present.     Mental Status: He is alert and oriented to person, place, and time.  Psychiatric:        Mood and Affect: Mood normal.        Behavior: Behavior normal.        Thought Content: Thought content normal.        Judgment: Judgment normal.     UC Treatments / Results  Labs (all labs ordered are listed, but only abnormal results are displayed) Labs Reviewed - No data to display  EKG   Radiology No results found.  Procedures Procedures (including critical care time)  Medications Ordered in UC Medications - No data to display  Initial Impression / Assessment and Plan / UC Course  I have reviewed the triage vital signs and the nursing notes.  Pertinent labs & imaging results that were available during my care of the patient were reviewed by me and considered in my medical decision making (see chart for details).  Patient is nontoxic-appearing 51 year old male here for evaluation of rash across his back and on both shoulders that was first noticed by his wife last night.  He states that he thinks about it he will get little itching but otherwise it does not itch normally and is not painful.  He denies going to the gym or exercising.  On exam patient has hypopigmented macular lesions across his back and shoulders with some mildly erythematous macular lesions on the anterior shoulders that are blanchable.  Patient's exam is consistent with tinea versicolor.  He states he  would prefer oral treatment so I will  prescribe him Diflucan 300 mg weekly x2 weeks.  If his symptoms do not improve he is to return for reevaluation or see dermatology.  I also suggested that he use topical Benadryl cream or take Claritin, Allegra, or Zyrtec as needed for itching and not apply topical steroid preparations as this might make the rash worse.  Patient verbalized understanding of same.   Final Clinical Impressions(s) / UC Diagnoses   Final diagnoses:  Tinea versicolor     Discharge Instructions      Take the Diflucan once weekly for 2 weeks.  If your rash does not resolve I recommend you follow-up with dermatology.  You can use OTC Benadryl cream or oral Claritin, Zyrtec, or Allegra as needed for itching.     ED Prescriptions     Medication Sig Dispense Auth. Provider   fluconazole (DIFLUCAN) 150 MG tablet  (Status: Discontinued) Take 2 tablets (300 mg total) by mouth daily for 3 days. 6 tablet Margarette Canada, NP   fluconazole (DIFLUCAN) 150 MG tablet Take 2 tablets (300 mg total) by mouth once a week for 2 doses. 4 tablet Margarette Canada, NP      PDMP not reviewed this encounter.   Margarette Canada, NP 04/14/21 1150

## 2021-04-14 NOTE — Discharge Instructions (Addendum)
Take the Diflucan once weekly for 2 weeks.  If your rash does not resolve I recommend you follow-up with dermatology.  You can use OTC Benadryl cream or oral Claritin, Zyrtec, or Allegra as needed for itching.

## 2021-04-17 ENCOUNTER — Encounter: Payer: Self-pay | Admitting: Internal Medicine

## 2021-05-10 ENCOUNTER — Ambulatory Visit (AMBULATORY_SURGERY_CENTER): Payer: Managed Care, Other (non HMO)

## 2021-05-10 ENCOUNTER — Other Ambulatory Visit: Payer: Self-pay

## 2021-05-10 VITALS — Ht 69.75 in | Wt 230.0 lb

## 2021-05-10 DIAGNOSIS — Z1211 Encounter for screening for malignant neoplasm of colon: Secondary | ICD-10-CM

## 2021-05-10 NOTE — Progress Notes (Signed)
No egg or soy allergy known to patient  ?No issues known to pt with past sedation with any surgeries or procedures ?Patient denies ever being told they had issues or difficulty with intubation  ?No FH of Malignant Hyperthermia ?Pt is not on diet pills ?Pt is not on home 02  ?Pt is not on blood thinners  ?Pt denies issues with constipation;  ?No A fib or A flutter ?Pt is fully vaccinated for Covid x 2 + boosters; ?NO PA's for preps discussed with pt in PV today  ?Discussed with pt there will be an out-of-pocket cost for prep and that varies from $0 to 70 + dollars - pt verbalized understanding  ?Due to the COVID-19 pandemic we are asking patients to follow certain guidelines in PV and the [place]   ?Pt aware of COVID protocols and [facility] guidelines  ?PV completed over the phone. Pt verified name, DOB, address and insurance during PV today.  ?Pt mailed instruction packet with copy of consent form to read and not return, and instructions.  ?Pt encouraged to call with questions or issues.  ?If pt has My chart, procedure instructions sent via My Chart  ? ?

## 2021-05-21 ENCOUNTER — Encounter: Payer: Self-pay | Admitting: Internal Medicine

## 2021-05-24 ENCOUNTER — Other Ambulatory Visit: Payer: Self-pay | Admitting: Family Medicine

## 2021-05-26 ENCOUNTER — Encounter: Payer: Self-pay | Admitting: Certified Registered Nurse Anesthetist

## 2021-05-31 ENCOUNTER — Ambulatory Visit (AMBULATORY_SURGERY_CENTER): Payer: Managed Care, Other (non HMO) | Admitting: Internal Medicine

## 2021-05-31 ENCOUNTER — Other Ambulatory Visit (INDEPENDENT_AMBULATORY_CARE_PROVIDER_SITE_OTHER): Payer: Managed Care, Other (non HMO)

## 2021-05-31 ENCOUNTER — Encounter: Payer: Self-pay | Admitting: Internal Medicine

## 2021-05-31 VITALS — BP 136/83 | HR 75 | Temp 98.9°F | Resp 22 | Ht 69.0 in | Wt 230.0 lb

## 2021-05-31 DIAGNOSIS — K7030 Alcoholic cirrhosis of liver without ascites: Secondary | ICD-10-CM | POA: Diagnosis not present

## 2021-05-31 DIAGNOSIS — D120 Benign neoplasm of cecum: Secondary | ICD-10-CM

## 2021-05-31 DIAGNOSIS — Z1211 Encounter for screening for malignant neoplasm of colon: Secondary | ICD-10-CM | POA: Diagnosis present

## 2021-05-31 DIAGNOSIS — D122 Benign neoplasm of ascending colon: Secondary | ICD-10-CM | POA: Diagnosis not present

## 2021-05-31 LAB — PROTIME-INR
INR: 1.4 ratio — ABNORMAL HIGH (ref 0.8–1.0)
Prothrombin Time: 15.5 s — ABNORMAL HIGH (ref 9.6–13.1)

## 2021-05-31 LAB — COMPREHENSIVE METABOLIC PANEL
ALT: 30 U/L (ref 0–53)
AST: 77 U/L — ABNORMAL HIGH (ref 0–37)
Albumin: 3.9 g/dL (ref 3.5–5.2)
Alkaline Phosphatase: 104 U/L (ref 39–117)
BUN: 7 mg/dL (ref 6–23)
CO2: 27 mEq/L (ref 19–32)
Calcium: 9 mg/dL (ref 8.4–10.5)
Chloride: 103 mEq/L (ref 96–112)
Creatinine, Ser: 0.83 mg/dL (ref 0.40–1.50)
GFR: 101.69 mL/min (ref >60.00)
Glucose, Bld: 88 mg/dL (ref 70–99)
Potassium: 4.3 mEq/L (ref 3.5–5.1)
Sodium: 135 mEq/L (ref 135–145)
Total Bilirubin: 3.6 mg/dL — ABNORMAL HIGH (ref 0.2–1.2)
Total Protein: 7.2 g/dL (ref 6.0–8.3)

## 2021-05-31 LAB — CBC
HCT: 44.2 % (ref 39.0–52.0)
Hemoglobin: 15.1 g/dL (ref 13.0–17.0)
MCHC: 34.2 g/dL (ref 30.0–36.0)
MCV: 101.2 fl — ABNORMAL HIGH (ref 78.0–100.0)
Platelets: 79 10*3/uL — ABNORMAL LOW (ref 150.0–400.0)
RBC: 4.37 Mil/uL (ref 4.22–5.81)
RDW: 13.4 % (ref 11.5–15.5)
WBC: 5.9 10*3/uL (ref 4.0–10.5)

## 2021-05-31 MED ORDER — SODIUM CHLORIDE 0.9 % IV SOLN: 500.0000 mL | INTRAVENOUS | Status: DC

## 2021-05-31 NOTE — Patient Instructions (Addendum)
Please read handouts provided. ?Continue present medications. ?Await pathology result ? ? ?YOU HAD AN ENDOSCOPIC PROCEDURE TODAY AT [facility]:   Refer to the procedure report that was given to you for any specific questions about what was found during the examination.  If the procedure report does not answer your questions, please call your gastroenterologist to clarify.  If you requested that your care partner not be given the details of your procedure findings, then the procedure report has been included in a sealed envelope for you to review at your convenience later. ? ?YOU SHOULD EXPECT: Some feelings of bloating in the abdomen. Passage of more gas than usual.  Walking can help get rid of the air that was put into your GI tract during the procedure and reduce the bloating. If you had a lower endoscopy (such as a colonoscopy or flexible sigmoidoscopy) you may notice spotting of blood in your stool or on the toilet paper. If you underwent a bowel prep for your procedure, you may not have a normal bowel movement for a few days. ? ?Please Note:  You might notice some irritation and congestion in your nose or some drainage.  This is from the oxygen used during your procedure.  There is no need for concern and it should clear up in a day or so. ? ?SYMPTOMS TO REPORT IMMEDIATELY: ? ?Following lower endoscopy (colonoscopy or flexible sigmoidoscopy): ? Excessive amounts of blood in the stool ? Significant tenderness or worsening of abdominal pains ? Swelling of the abdomen that is new, acute ? Fever of 100?F or higher ? ? ?For urgent or emergent issues, a gastroenterologist can be reached at any hour by calling [phone]. ?Do not use MyChart messaging for urgent concerns.  ? ? ?DIET:  We do recommend a small meal at first, but then you may proceed to your regular diet.  Drink plenty of fluids but you should avoid alcoholic beverages for 24 hours. ? ?ACTIVITY:  You should plan to take it easy for  the rest of today and you should NOT DRIVE or use heavy machinery until tomorrow (because of the sedation medicines used during the test).   ? ?FOLLOW UP: ?Our staff will call the number listed on your records 48-72 hours following your procedure to check on you and address any questions or concerns that you may have regarding the information given to you following your procedure. If we do not reach you, we will leave a message.  We will attempt to reach you two times.  During this call, we will ask if you have developed any symptoms of COVID 19. If you develop any symptoms (ie: fever, flu-like symptoms, shortness of breath, cough etc.) before then, please call [phone].  If you test positive for Covid 19 in the 2 weeks post procedure, please call and report this information to [place].   ? ?If any biopsies were taken you will be contacted by phone or by letter within the next 1-3 weeks.  Please call us at [phone] if you have not heard about the biopsies in 3 weeks.  ? ? ?SIGNATURES/CONFIDENTIALITY: ?You and/or your care partner have signed paperwork which will be entered into your electronic medical record.  These signatures attest to the fact that that the information above on your After Visit Summary has been reviewed and is understood.  Full responsibility of the confidentiality of this discharge information lies with you and/or your care-partner. I found and removed 3 tiny polyps  today. ? ?I will let you know pathology results and when to have another routine colonoscopy by mail and/or My Chart. ? ?Hemorrhoids were a little swollen which is commonly seen after a colonoscopy prep. ? ?I am sending you to the lab today for follow-up labs re: liver disease. ? ?My office will arrange an appointment for an ultrasound of liver and abdomen + a follow-up appointment. ?You will be contacted. ? ?If you are using alcohol I need to remind you that complete abstinence is best for your liver and overall health. ? ?I  appreciate the opportunity to care for you. ?Gatha Mayer, MD, [facility] ? ?

## 2021-05-31 NOTE — Progress Notes (Signed)
[facility] History and Physical ? ? ?Primary Care Physician:  Owens Loffler, MD ? ? ?Reason for Procedure:   CRCA screening ? ?Plan:    colonoscopy ? ? ? ? ?HPI: CADEN FUKUSHIMA is a 51 y.o. male here for screening colonoscopy, has hx cirrhosis ? ? ?Past Medical History:  ?Diagnosis Date  ? Abnormal transaminases question cirrhosis, suspect alcohol as cause 02/03/2019  ? Alcoholic cirrhosis of liver without ascites ([place]) 08/18/2019  ? Anxiety   ? on meds  ? Chronic back pain   ? GERD (gastroesophageal reflux disease)   ? takes OTC meds PRN  ? History of nephrolithiasis   ? Hyperlipidemia   ? diet changes encouraged  ? Seasonal allergies   ? Sleep apnea   ? uses CPAP  ? Thrombocytopenia ([place]) 02/03/2019  ? ? ?Past Surgical History:  ?Procedure Laterality Date  ? ESOPHAGOGASTRODUODENOSCOPY  10/2018  ? ROTATOR CUFF REPAIR Right   ? ? ?Prior to Admission medications   ?Medication Sig Start Date End Date Taking? Authorizing Provider  ?famotidine (PEPCID) 20 MG tablet Take 20 mg by mouth daily as needed for heartburn or indigestion.   Yes [provider]  ?fluticasone (FLONASE) 50 MCG/ACT nasal spray PLACE 2 SPRAYS INTO THE NOSE ONCE DAILY. 05/24/21  Yes Copland, Frederico Hamman, MD  ?sertraline (ZOLOFT) 50 MG tablet TAKE 1 TABLET BY MOUTH EVERY DAY ?Patient taking differently: Take 25 mg by mouth daily. TAKE 1/2 TABLET BY MOUTH EVERY DAY 08/05/20  Yes Copland, Frederico Hamman, MD  ? ? ?Current Outpatient Medications  ?Medication Sig Dispense Refill  ? famotidine (PEPCID) 20 MG tablet Take 20 mg by mouth daily as needed for heartburn or indigestion.    ? fluticasone (FLONASE) 50 MCG/ACT nasal spray PLACE 2 SPRAYS INTO THE NOSE ONCE DAILY. 48 mL 2  ? sertraline (ZOLOFT) 50 MG tablet TAKE 1 TABLET BY MOUTH EVERY DAY (Patient taking differently: Take 25 mg by mouth daily. TAKE 1/2 TABLET BY MOUTH EVERY DAY) 90 tablet 0  ? ?Current Facility-Administered Medications  ?Medication Dose Route Frequency Provider Last Rate  Last Admin  ? 0.9 %  sodium chloride infusion  500 mL Intravenous Continuous Gatha Mayer, MD      ? ? ?Allergies as of 05/31/2021 - Review Complete 05/31/2021  ?Allergen Reaction Noted  ? Cefdinir  06/22/2008  ? ? ?Family History  ?Problem Relation Age of Onset  ? Diabetes Mother   ? Heart disease Mother   ? Colon polyps Father   ? Diabetes Father   ? Heart disease Father   ? Diabetes Brother   ? Esophageal cancer Neg Hx   ? Pancreatic cancer Neg Hx   ? Stomach cancer Neg Hx   ? Liver disease Neg Hx   ? ? ?Social History  ? ?Socioeconomic History  ? Marital status: Married  ?  Spouse name: Not on file  ? Number of children: Not on file  ? Years of education: Not on file  ? Highest education level: Not on file  ?Occupational History  ? Occupation: Gaffer  ?  Employer: [employer]  ?  Comment: [employer]  ?Tobacco Use  ? Smoking status: Former  ?  Years: 7.00  ?  Types: Cigarettes  ?  Quit date: 02/18/2000  ?  Years since quitting: 21.2  ? Smokeless tobacco: Never  ?Vaping Use  ? Vaping Use: Never used  ?Substance and Sexual Activity  ? Alcohol use: Yes  ?  Alcohol/week: 21.0 standard  drinks  ?  Types: 21 Standard drinks or equivalent per week  ?  Comment: Occ.  ? Drug use: No  ? Sexual activity: Yes  ?  Partners: Female  ?Other Topics Concern  ? Not on file  ?Social History Narrative  ? Married, 1 biologic son born 30, 1 daughter by marriage born 43 when he has grandchildren   ? 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 1 caffeinated beverage most days, soda  ? Non-smoker 3 alcoholic drinks a day usually wine white wine.  No drug use.  ? regular exercise- no  ? ?Social Determinants of Health  ? ?Financial Resource Strain: Not on file  ?Food Insecurity: Not on file  ?Transportation Needs: Not on file  ?Physical Activity: Not on file  ?Stress: Not on file  ?Social Connections: Not on file  ?Intimate Partner Violence: Not on file   ? ? ?Review of Systems: ? ?All other review of systems negative except as mentioned in the HPI. ? ?Physical Exam: ?Vital signs ?BP (!) 150/87   Pulse 81   Temp 98.9 ?F (37.2 ?C) (Temporal)   Resp 13   Ht '5\' 9"'$  (1.753 m)   Wt 230 lb (104.3 kg)   SpO2 97%   BMI 33.97 kg/m?  ? ?General:   Alert,  Well-developed, well-nourished, pleasant and cooperative in NAD ?Lungs:  Clear throughout to auscultation.   ?Heart:  Regular rate and rhythm; no murmurs, clicks, rubs,  or gallops. ?Abdomen:  Soft, nontender and nondistended. Normal bowel sounds.   ?Neuro/Psych:  Alert and cooperative. Normal mood and affect. A and O x 3 ? ? ?'@Dawnn Nam'$  Simonne Maffucci, MD, [facility] ?[facility] ?[phone] (pager) ?05/31/2021 2:35 PM@ ? ?

## 2021-05-31 NOTE — Progress Notes (Signed)
Called to room to assist during endoscopic procedure.  Patient ID and intended procedure confirmed with present staff. Received instructions for my participation in the procedure from the performing physician.  

## 2021-05-31 NOTE — Op Note (Signed)
[place] ?Patient Name: Kyle Mercer ?Procedure Date: 05/31/2021 2:14 PM ?MRN: [id number] ?Endoscopist: Gatha Mayer , MD ?Age: 51 ?Referring MD:  ?Date of Birth: [birth year] ?Gender: Male ?Account #: [id number] ?Procedure:                Colonoscopy ?Indications:              Screening for colorectal malignant neoplasm, This  ?                          is the patient's first colonoscopy ?Medicines:                Monitored Anesthesia Care ?Procedure:                Pre-Anesthesia Assessment: ?                          - Prior to the procedure, a History and Physical  ?                          was performed, and patient medications and  ?                          allergies were reviewed. The patient's tolerance of  ?                          previous anesthesia was also reviewed. The risks  ?                          and benefits of the procedure and the sedation  ?                          options and risks were discussed with the patient.  ?                          All questions were answered, and informed consent  ?                          was obtained. Prior Anticoagulants: The patient has  ?                          taken no previous anticoagulant or antiplatelet  ?                          agents. ASA Grade Assessment: III - A patient with  ?                          severe systemic disease. After reviewing the risks  ?                          and benefits, the patient was deemed in  ?                          satisfactory condition to undergo the procedure. ?  After obtaining informed consent, the colonoscope  ?                          was passed under direct vision. Throughout the  ?                          procedure, the patient's blood pressure, pulse, and  ?                          oxygen saturations were monitored continuously. The  ?                          Olympus CF-HQ190L (Serial# [id number]) Colonoscope was  ?                          introduced through the anus  and advanced to the the  ?                          cecum, identified by appendiceal orifice and  ?                          ileocecal valve. The colonoscopy was performed  ?                          without difficulty. The patient tolerated the  ?                          procedure well. The quality of the bowel  ?                          preparation was good. The ileocecal valve,  ?                          appendiceal orifice, and rectum were photographed.  ?                          The bowel preparation used was Miralax via split  ?                          dose instruction. ?Scope In: 2:41:09 PM ?Scope Out: 2:54:59 PM ?Scope Withdrawal Time: 0 hours 12 minutes 9 seconds  ?Total Procedure Duration: 0 hours 13 minutes 50 seconds  ?Findings:                 The perianal and digital rectal examinations were  ?                          normal. Pertinent negatives include normal prostate  ?                          (size, shape, and consistency). ?                          Three sessile polyps were found in the ascending  ?  colon and cecum. The polyps were diminutive in  ?                          size. These polyps were removed with a cold snare.  ?                          Resection and retrieval were complete. Verification  ?                          of patient identification for the specimen was  ?                          done. Estimated blood loss was minimal. ?                          External and internal hemorrhoids were found. ?                          The exam was otherwise without abnormality on  ?                          direct and retroflexion views. ?Complications:            No immediate complications. ?Estimated Blood Loss:     Estimated blood loss was minimal. ?Impression:               - Three diminutive polyps in the ascending colon  ?                          and in the cecum, removed with a cold snare.  ?                          Resected and retrieved. ?                           - External and internal hemorrhoids. ?                          - The examination was otherwise normal on direct  ?                          and retroflexion views. ?Recommendation:           - Patient has a contact number available for  ?                          emergencies. The signs and symptoms of potential  ?                          delayed complications were discussed with the  ?                          patient. Return to normal activities tomorrow.  ?                          Written discharge  instructions were provided to the  ?                          patient. ?                          - Resume previous diet. ?                          - Continue present medications. ?                          - Await pathology results. ?                          - Repeat colonoscopy is recommended for  ?                          surveillance. The colonoscopy date will be  ?                          determined after pathology results from today's  ?                          exam become available for review. ?                          - Labs today (ordered) CBC, CMET, INR AFP ?                          OFFICE TO SCHEDULE COMPLETE ABDOMINAL ULTRASOUND DX  ?                          CIRRHOSIS AND SPLENOMEGALY ?                          OFFICE WILL ALSO ARRANGE F/U APPOINTMENT WITH ME  ?                          AFTER [place] COMPLETED ?Gatha Mayer, MD ?05/31/2021 3:05:21 PM ?This report has been signed electronically. ?

## 2021-05-31 NOTE — Progress Notes (Signed)
Pt's states no medical or surgical changes since previsit or office visit. 

## 2021-06-03 LAB — AFP TUMOR MARKER: AFP-Tumor Marker: 16.9 ng/mL — ABNORMAL HIGH (ref <6.1)

## 2021-06-04 ENCOUNTER — Telehealth: Payer: Self-pay

## 2021-06-04 ENCOUNTER — Telehealth: Payer: Self-pay | Admitting: *Deleted

## 2021-06-04 NOTE — Telephone Encounter (Signed)
Attempted f/u phone call. No answer. Left message. °

## 2021-06-04 NOTE — Telephone Encounter (Signed)
Second post procedure follow up call, no answer 

## 2021-06-05 ENCOUNTER — Other Ambulatory Visit: Payer: Self-pay | Admitting: Family Medicine

## 2021-06-05 ENCOUNTER — Other Ambulatory Visit: Payer: Self-pay

## 2021-06-05 DIAGNOSIS — K7030 Alcoholic cirrhosis of liver without ascites: Secondary | ICD-10-CM

## 2021-06-05 DIAGNOSIS — R161 Splenomegaly, not elsewhere classified: Secondary | ICD-10-CM

## 2021-06-11 ENCOUNTER — Encounter: Payer: Self-pay | Admitting: Internal Medicine

## 2021-06-11 DIAGNOSIS — Z86010 Personal history of colonic polyps: Secondary | ICD-10-CM | POA: Insufficient documentation

## 2021-06-11 DIAGNOSIS — Z860101 Personal history of adenomatous and serrated colon polyps: Secondary | ICD-10-CM

## 2021-06-11 HISTORY — DX: Personal history of colonic polyps: Z86.010

## 2021-06-11 HISTORY — DX: Personal history of adenomatous and serrated colon polyps: Z86.0101

## 2021-06-13 ENCOUNTER — Ambulatory Visit (HOSPITAL_COMMUNITY)
Admission: RE | Admit: 2021-06-13 | Discharge: 2021-06-13 | Disposition: A | Discharge status: Home / Self Care | Payer: Managed Care, Other (non HMO) | Source: Ambulatory Visit | Attending: Internal Medicine | Admitting: Internal Medicine

## 2021-06-13 DIAGNOSIS — K7030 Alcoholic cirrhosis of liver without ascites: Secondary | ICD-10-CM | POA: Insufficient documentation

## 2021-06-13 DIAGNOSIS — R161 Splenomegaly, not elsewhere classified: Secondary | ICD-10-CM | POA: Insufficient documentation

## 2021-06-21 ENCOUNTER — Ambulatory Visit (INDEPENDENT_AMBULATORY_CARE_PROVIDER_SITE_OTHER): Payer: Managed Care, Other (non HMO) | Admitting: Internal Medicine

## 2021-06-21 ENCOUNTER — Encounter: Payer: Self-pay | Admitting: Internal Medicine

## 2021-06-21 VITALS — BP 120/78 | HR 92 | Ht 70.0 in | Wt 234.0 lb

## 2021-06-21 DIAGNOSIS — K760 Fatty (change of) liver, not elsewhere classified: Secondary | ICD-10-CM

## 2021-06-21 DIAGNOSIS — K7030 Alcoholic cirrhosis of liver without ascites: Secondary | ICD-10-CM | POA: Diagnosis not present

## 2021-06-21 DIAGNOSIS — D696 Thrombocytopenia, unspecified: Secondary | ICD-10-CM

## 2021-06-21 NOTE — Patient Instructions (Signed)
We are placing a referral to the [facility] for your cirrhosis. They will contact you about the appointment. ? ? ?They are located at [address], [address] [place], [place] [address] ? ?Phone # [phone] ? ? ? ?I appreciate the opportunity to care for you. ?Silvano Rusk, MD, [facility] ?

## 2021-06-21 NOTE — Progress Notes (Signed)
? ?Kyle Mercer 51 y.o. 1970-10-25 [id number] ? ?Assessment & Plan:  ? ?Encounter Diagnoses  ?Name Primary?  ? Alcoholic cirrhosis of liver without ascites ([place]) Yes  ? Fatty liver ASH/NASH?   ? Thrombocytopenia ([place])   ? ?I do think he has advanced alcoholic liver disease plus or minus a component of NASH would be possible. ? ?Abstinence from alcohol is recommended.  He is working on it. ?He is also modifying his diet reducing carbohydrates which would help as well. ? ?My working diagnosis has been alcoholic liver disease.  I had done a targeted serologic evaluation in the past.  I thought the mildly positive ANA was inconsequential.  I am referring him to the [facility] for additional evaluation.  I tried to stress the needs for abstinence.  He may need further help with that.  I suspect he will need additional serologies and we did not get around to hepatitis B vaccination in the past but that would be appropriate plus or minus rechecking his serologies.  The mildly elevated AFP is probably related to his liver disease its not increasing significantly over a few years ago.  I am not suspicious of malignancy but he will need to continue surveillance. ? ?There were no signs of varices and late 2020 (September).  He does have thrombocytopenia, slightly nodular contour on the MRI but does not have ultrasonography characteristics of cirrhosis, apparently. ? ?Child's B 7 points ?MELD Na 17 ? ?I appreciate the opportunity to care for this patient. ?CC: Kyle Mercer, Kyle Mercer, Kyle Mercer ?[name], NP ?Subjective:  ? ?Chief Complaint: Alcoholic liver disease/cirrhosis ? ?HPI ?51 year old white man with a history of alcohol liver disease and suspected cirrhosis based on about an MRI in the past, who recently came through for a screening colonoscopy after not being seen for about 2 years, and I had him do labs and an ultrasound to regroup regarding liver disease. ? ?I originally saw him in September 2020 he had GERD  symptoms, abnormal transaminases.  EGD demonstrated esophagitis no other abnormalities, subsequently an abdominal ultrasound was done in 2020 and it demonstrated diffuse increased hepatic parenchymal echogenicity consistent with steatosis or underlying liver disease and mild to moderate splenomegaly.  Because of persistent regurgitation like symptoms and vomiting I had him do a CT of the abdomen and pelvis on 12/30/2018 and it suggested cirrhosis and mild splenomegaly and signs of portal hypertension.  There was also a heterogeneous area in the inferior right hepatic lobe, cause not clear MRI recommended.  The MRI on 01/06/2019 showed that there was steatosis and focal fatty sparing and also it was suspected that venous shunting was also involved in the abnormality seen on CT scan.  However there were some changes still of concern so a follow-up MRI was recommended and that was performed on 04/27/2019.  It showed cirrhosis with mild hepatic steatosis and focal fatty sparing along the gallbladder fossa and no suspicious lesions.  Mild splenomegaly no ascites mild perigastric varices and a patent portal vein. ? ?Lab testing showed borderline positive evidence a antibody total negative hepatitis B immunity and surface antigen and negative HCV antibody.  He did have a mildly positive antinuclear antibody at 1-40.  Alpha-fetoprotein was 14.2 in 2020.  Platelets were consistently low in the low 100s.  Normal hemoglobin hematocrit.  Ferritin was 390.  AST consistently elevated approximately 2-3 times normal with ALT in the 30-50 range. ? ? ?When he came through for his colonoscopy I found 3 diminutive polyps  and mixed hemorrhoids.  2 adenomas and 1 SSP with plan for recall in 5 years.  We also repeated labs and an ultrasound. ? ?Bilirubin 3.6, AST 77.  ALT 30 total protein 7.2 albumin 3.9.  Renal function normal.  INR 1.4 it was 1.2 2 years ago.  MCV 101 platelets 79 white count 5.9 hemoglobin 15.1.  Complete abdominal  ultrasound demonstrated splenomegaly and increased hepatic parenchymal echogenicity suggestive of steatosis.  Blood flow on portal vein was patent with normal direction to the liver. ? ?He had started drinking again about after 6 months of abstinence in 2020.  There were some personal issues with family deaths etc. and COVID and he went back to drinking.  He tells me now after me giving him these results prior to this visit and in reminding him about abstinence from alcohol he has "cut way down".  Now he says he is only drinking wine with his wife on the weekends but does not completely quantify that.  He has quit alcohol completely before as mentioned but over time has been a regular drinker.  He is also trying to lose some weight and reduce carbohydrates.  He has been successful with low-carb and keto diet changes in the past also. ? ?Allergies  ?Allergen Reactions  ? Cefdinir   ?  REACTION: cramping in stomach, vomiting and diarrhea  ? ?Current Meds  ?Medication Sig  ? famotidine (PEPCID) 20 MG tablet Take 20 mg by mouth daily as needed for heartburn or indigestion.  ? fluticasone (FLONASE) 50 MCG/ACT nasal spray PLACE 2 SPRAYS INTO THE NOSE ONCE DAILY.  ? sertraline (ZOLOFT) 50 MG tablet TAKE 1 TABLET BY MOUTH EVERY DAY  ? ?Past Medical History:  ?Diagnosis Date  ? Abnormal transaminases question cirrhosis, suspect alcohol as cause 02/03/2019  ? Alcoholic cirrhosis of liver without ascites ([place]) 08/18/2019  ? Anxiety   ? on meds  ? Chronic back pain   ? COVID-19   ? GERD (gastroesophageal reflux disease)   ? takes OTC meds PRN  ? History of nephrolithiasis   ? Hx of adenomatous and sessile serrated colonic polyps 06/11/2021  ? Hyperlipidemia   ? diet changes encouraged  ? Seasonal allergies   ? Sleep apnea   ? uses CPAP  ? Thrombocytopenia ([place]) 02/03/2019  ? ?Past Surgical History:  ?Procedure Laterality Date  ? COLONOSCOPY    ? ESOPHAGOGASTRODUODENOSCOPY  10/2018  ? ROTATOR CUFF REPAIR Right   ? ?Social History   ? ?Social History Narrative  ? Married, 1 biologic son born 30, 1 daughter by marriage born 29 when he has grandchildren   ? 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 1 caffeinated beverage most days, soda  ? Non-smoker 3 alcoholic drinks a day usually wine white wine.  No drug use.  ? regular exercise- no  ? ?family history includes Colon polyps in his father; Diabetes in his brother, father, and mother; Heart disease in his father and mother. ? ? ?Review of Systems ?As per HPI ? ?Objective:  ? Physical Exam ?BP 120/78   Pulse 92   Ht '5\' 10"'$  (1.778 m)   Wt 234 lb (106.1 kg)   SpO2 97%   BMI 33.58 kg/m?  ?Well-developed well-nourished white man no acute distress ? ?

## 2021-06-26 ENCOUNTER — Telehealth: Payer: Self-pay

## 2021-06-26 NOTE — Telephone Encounter (Signed)
We received a fax today from the [facility] that they have tried multiple times to reach [facility] to set up his appointment. They are mailing him a letter to call them. I have tried both his #'s and left him messages to call them.  ?

## 2021-07-09 ENCOUNTER — Encounter: Payer: Self-pay | Admitting: Family Medicine

## 2021-07-09 ENCOUNTER — Other Ambulatory Visit: Payer: Self-pay | Admitting: Family Medicine

## 2021-07-10 ENCOUNTER — Telehealth: Payer: Self-pay

## 2021-07-10 MED ORDER — FLUCONAZOLE 150 MG PO TABS: ORAL_TABLET | ORAL | 1 refills | Status: DC

## 2021-07-10 NOTE — Telephone Encounter (Signed)
Patient has made an appointment with [facility]-Dawn Drazek, NP for 09/11/21 at 2:15pm.

## 2021-09-25 ENCOUNTER — Telehealth: Payer: Self-pay | Admitting: Internal Medicine

## 2021-09-25 NOTE — Telephone Encounter (Signed)
Please arrange direct EGD given patient's diagnosis of cirrhosis.  The liver clinic has recommended he have a screening EGD.  He should be aware of this, he was there on July 26.  We are looking at his esophagus to see if he has developed dilated veins called varices which can occur in the setting of cirrhosis.  This is an important thing to look for.

## 2021-09-26 NOTE — Telephone Encounter (Signed)
Left message for pt to call back  °

## 2021-09-27 ENCOUNTER — Other Ambulatory Visit: Payer: Self-pay

## 2021-09-27 DIAGNOSIS — K7030 Alcoholic cirrhosis of liver without ascites: Secondary | ICD-10-CM

## 2021-09-27 NOTE — Telephone Encounter (Signed)
Pt notified of Dr. Carlean Purl recommendations: Pt was scheduled for an EGD with Dr. Carlean Purl on 10/04/2021 at 4:00 PM: Pt to arrive at 3:00 PM: Pt made aware: Pt was scheduled for a Telephone Previsit appointment on 10/02/2021 at 2:30 PM. Pt aware Pt verbalized understanding with all questions answered.

## 2021-09-28 ENCOUNTER — Encounter: Payer: Self-pay | Admitting: Certified Registered Nurse Anesthetist

## 2021-10-01 ENCOUNTER — Encounter: Payer: Self-pay | Admitting: Internal Medicine

## 2021-10-02 ENCOUNTER — Ambulatory Visit (AMBULATORY_SURGERY_CENTER): Payer: Managed Care, Other (non HMO)

## 2021-10-02 VITALS — Ht 70.0 in | Wt 230.0 lb

## 2021-10-02 DIAGNOSIS — K7469 Other cirrhosis of liver: Secondary | ICD-10-CM

## 2021-10-02 NOTE — Progress Notes (Signed)
No egg or soy allergy known to patient  No issues known to pt with past sedation with any surgeries or procedures Patient denies ever being told they had issues or difficulty with intubation  No FH of Malignant Hyperthermia Pt is not on diet pills Pt is not on home 02  Pt is not on blood thinners  Pt denies issues with constipation  No A fib or A flutter Have any cardiac testing pending--NO Pt instructed to use Singlecare.com or GoodRx for a price reduction on prep   

## 2021-10-04 ENCOUNTER — Encounter: Payer: Self-pay | Admitting: Internal Medicine

## 2021-10-04 ENCOUNTER — Ambulatory Visit: Payer: Managed Care, Other (non HMO) | Admitting: Internal Medicine

## 2021-10-04 VITALS — BP 131/80 | HR 78 | Temp 97.1°F | Resp 17 | Ht 70.0 in | Wt 230.0 lb

## 2021-10-04 DIAGNOSIS — K766 Portal hypertension: Secondary | ICD-10-CM

## 2021-10-04 DIAGNOSIS — K3189 Other diseases of stomach and duodenum: Secondary | ICD-10-CM | POA: Diagnosis not present

## 2021-10-04 DIAGNOSIS — K7030 Alcoholic cirrhosis of liver without ascites: Secondary | ICD-10-CM | POA: Diagnosis present

## 2021-10-04 MED ORDER — SODIUM CHLORIDE 0.9 % IV SOLN: 500.0000 mL | Freq: Once | INTRAVENOUS | Status: DC

## 2021-10-04 NOTE — Patient Instructions (Addendum)
YOU HAD AN ENDOSCOPIC PROCEDURE TODAY AT THE  ENDOSCOPY CENTER:   Refer to the procedure report that was given to you for any specific questions about what was found during the examination.  If the procedure report does not answer your questions, please call your gastroenterologist to clarify.  If you requested that your care partner not be given the details of your procedure findings, then the procedure report has been included in a sealed envelope for you to review at your convenience later.  YOU SHOULD EXPECT: Some feelings of bloating in the abdomen. Passage of more gas than usual.  Walking can help get rid of the air that was put into your GI tract during the procedure and reduce the bloating. If you had a lower endoscopy (such as a colonoscopy or flexible sigmoidoscopy) you may notice spotting of blood in your stool or on the toilet paper. If you underwent a bowel prep for your procedure, you may not have a normal bowel movement for a few days.  Please Note:  You might notice some irritation and congestion in your nose or some drainage.  This is from the oxygen used during your procedure.  There is no need for concern and it should clear up in a day or so.  SYMPTOMS TO REPORT IMMEDIATELY:  Following lower endoscopy (colonoscopy or flexible sigmoidoscopy):  Excessive amounts of blood in the stool  Significant tenderness or worsening of abdominal pains  Swelling of the abdomen that is new, acute  Fever of 100F or higher  Following upper endoscopy (EGD)  Vomiting of blood or coffee ground material  New chest pain or pain under the shoulder blades  Painful or persistently difficult swallowing  New shortness of breath  Fever of 100F or higher  Black, tarry-looking stools  For urgent or emergent issues, a gastroenterologist can be reached at any hour by calling [phone]. Do not use MyChart messaging for urgent concerns.    DIET:  We do recommend a small meal at first, but  then you may proceed to your regular diet.  Drink plenty of fluids but you should avoid alcoholic beverages for 24 hours.  ACTIVITY:  You should plan to take it easy for the rest of today and you should NOT DRIVE or use heavy machinery until tomorrow (because of the sedation medicines used during the test).    FOLLOW UP: Our staff will call the number listed on your records the next business day following your procedure.  We will call around 7:15- 8:00 am to check on you and address any questions or concerns that you may have regarding the information given to you following your procedure. If we do not reach you, we will leave a message.  If you develop any symptoms (ie: fever, flu-like symptoms, shortness of breath, cough etc.) before then, please call [phone].  If you test positive for Covid 19 in the 2 weeks post procedure, please call and report this information to us.    If any biopsies were taken you will be contacted by phone or by letter within the next 1-3 weeks.  Please call us at [phone] if you have not heard about the biopsies in 3 weeks.    SIGNATURES/CONFIDENTIALITY: You and/or your care partner have signed paperwork which will be entered into your electronic medical record.  These signatures attest to the fact that that the information above on your After Visit Summary has been reviewed and is understood.  Full responsibility of the confidentiality   of this discharge information lies with you and/or your care-partner.  

## 2021-10-04 NOTE — Progress Notes (Signed)
Report given to PACU, vss 

## 2021-10-04 NOTE — Op Note (Addendum)
[facility] Patient Name: Caster Fayette Procedure Date: 10/04/2021 10:43 AM MRN: [id number] Endoscopist: Gatha Mayer , MD Age: 51 Referring MD:  Date of Birth: 24-Apr-1970 Gender: Male Account #: [id number] Procedure:                Upper GI endoscopy Indications:              Cirrhosis rule out esophageal varices Medicines:                Monitored Anesthesia Care Procedure:                Pre-Anesthesia Assessment:                           - Prior to the procedure, a History and Physical                            was performed, and patient medications and                            allergies were reviewed. The patient's tolerance of                            previous anesthesia was also reviewed. The risks                            and benefits of the procedure and the sedation                            options and risks were discussed with the patient.                            All questions were answered, and informed consent                            was obtained. Prior Anticoagulants: The patient has                            taken no previous anticoagulant or antiplatelet                            agents. ASA Grade Assessment: III - A patient with                            severe systemic disease. After reviewing the risks                            and benefits, the patient was deemed in                            satisfactory condition to undergo the procedure.                           After obtaining informed consent, the endoscope was  passed under direct vision. Throughout the                            procedure, the patient's blood pressure, pulse, and                            oxygen saturations were monitored continuously. The                            Endoscope was introduced through the mouth, and                            advanced to the second part of duodenum. The upper                            GI endoscopy was  accomplished without difficulty.                            The patient tolerated the procedure well. Scope In: Scope Out: Findings:                 Moderate portal hypertensive gastropathy was found                            in the entire examined stomach.                           Localized mucosal changes characterized by                            Polypoid, erythematouus mucosa were found in the                            prepyloric region of the stomach. Biopsies were                            taken with a cold forceps for histology.                           The examined esophagus was normal.                           The examined duodenum was normal.                           The cardia and gastric fundus were otherwise normal                            on retroflexion. Complications:            No immediate complications. Estimated Blood Loss:     Estimated blood loss was minimal. Impression:               - Portal hypertensive gastropathy. Moderate and  more prominent in proximal stomach                           - Polypoid, erythematouus mucosa mucosa in the                            prepyloric region of the stomach. Biopsied. I think                            this may also be from portal hypertensive                            gastropathy                           - Normal esophagus.                           - Normal examined duodenum. Recommendation:           - Patient has a contact number available for                            emergencies. The signs and symptoms of potential                            delayed complications were discussed with the                            patient. Return to normal activities tomorrow.                            Written discharge instructions were provided to the                            patient.                           - Resume previous diet.                           - Continue present medications.                            - Await pathology results.                           - Repeat upper endoscopy timing to be determined                            after path review                           - ? if he is a candidate for Rx to reduce risk of                            hepatic decompensation - will  review w/ hepatology                            - cc: [name], NP                           has had abdominal pain lately but was passing                            kidney stone x 4 weeks - will observe for                            resolution as suspect most or all problems were                            related to stone Gatha Mayer, MD 10/04/2021 11:07:37 AM This report has been signed electronically.

## 2021-10-04 NOTE — Progress Notes (Signed)
Called to room to assist during endoscopic procedure.  Patient ID and intended procedure confirmed with present staff. Received instructions for my participation in the procedure from the performing physician.  

## 2021-10-04 NOTE — Progress Notes (Signed)
1043 Robinul 0.1 mg IV given due large amount of secretions upon assessment.  MD made aware, vss

## 2021-10-04 NOTE — Progress Notes (Signed)
Pt's states no medical or surgical changes since previsit or office visit. 

## 2021-10-04 NOTE — Progress Notes (Signed)
[facility] History and Physical   Primary Care Physician:  Owens Loffler, MD   Reason for Procedure:   Cirrhosis screen for varices  Plan:    EGD     HPI: Kyle Mercer is a 51 y.o. male here to screen for esophageal varices in setting of EtOH cirrhosis   Past Medical History:  Diagnosis Date   Abnormal transaminases question cirrhosis, suspect alcohol as cause 11/91/4782   Alcoholic cirrhosis of liver without ascites ([place]) 08/18/2019   Allergy    SEASONAL   Anxiety    on meds   Arthritis    NECK,LOWER BACK   Chronic back pain    COVID-19    GERD (gastroesophageal reflux disease)    takes OTC meds PRN   Heart murmur    History of nephrolithiasis    Hx of adenomatous and sessile serrated colonic polyps 06/11/2021   Hyperlipidemia    diet changes encouraged   Seasonal allergies    Sleep apnea    uses CPAP   Substance abuse ([place])    H/O ETOH   Thrombocytopenia ([place]) 02/03/2019    Past Surgical History:  Procedure Laterality Date   COLONOSCOPY     ESOPHAGOGASTRODUODENOSCOPY  10/2018   ROTATOR CUFF REPAIR Right    UPPER GASTROINTESTINAL ENDOSCOPY      Prior to Admission medications   Medication Sig Start Date End Date Taking? Authorizing Provider  famotidine (PEPCID) 20 MG tablet Take 20 mg by mouth daily as needed for heartburn or indigestion.   Yes [provider]  fluticasone (FLONASE) 50 MCG/ACT nasal spray PLACE 2 SPRAYS INTO THE NOSE ONCE DAILY. 05/24/21  Yes Copland, Frederico Hamman, MD  sertraline (ZOLOFT) 50 MG tablet TAKE 1 TABLET BY MOUTH EVERY DAY 06/06/21  Yes Copland, Frederico Hamman, MD  fluconazole (DIFLUCAN) 150 MG tablet Take 2 tab po today, repeat 2 tabs in 7 days Patient not taking: Reported on 10/04/2021 07/10/21   Owens Loffler, MD    Current Outpatient Medications  Medication Sig Dispense Refill   famotidine (PEPCID) 20 MG tablet Take 20 mg by mouth daily as needed for heartburn or indigestion.     fluticasone (FLONASE) 50  MCG/ACT nasal spray PLACE 2 SPRAYS INTO THE NOSE ONCE DAILY. 48 mL 2   sertraline (ZOLOFT) 50 MG tablet TAKE 1 TABLET BY MOUTH EVERY DAY 90 tablet 0   fluconazole (DIFLUCAN) 150 MG tablet Take 2 tab po today, repeat 2 tabs in 7 days (Patient not taking: Reported on 10/04/2021) 4 tablet 1   Current Facility-Administered Medications  Medication Dose Route Frequency Provider Last Rate Last Admin   0.9 %  sodium chloride infusion  500 mL Intravenous Once Gatha Mayer, MD        Allergies as of 10/04/2021 - Review Complete 10/04/2021  Allergen Reaction Noted   Cefdinir  06/22/2008    Family History  Problem Relation Age of Onset   Diabetes Mother    Heart disease Mother    Colon polyps Father    Diabetes Father    Heart disease Father    Diabetes Brother    Esophageal cancer Neg Hx    Pancreatic cancer Neg Hx    Stomach cancer Neg Hx    Liver disease Neg Hx    Colon cancer Neg Hx    Crohn's disease Neg Hx    Rectal cancer Neg Hx    Ulcerative colitis Neg Hx     Social History   Socioeconomic History   Marital status:  Married    Spouse name: Not on file   Number of children: Not on file   Years of education: Not on file   Highest education level: Not on file  Occupational History   Occupation: SOFTWARE Financial planner: [employer]    Comment: Administrator, [employer]  Tobacco Use   Smoking status: Former    Years: 7.00    Types: Cigarettes    Quit date: 02/18/2000    Years since quitting: 21.6    Passive exposure: Past   Smokeless tobacco: Never  Vaping Use   Vaping Use: Never used  Substance and Sexual Activity   Alcohol use: Yes    Alcohol/week: 21.0 standard drinks of alcohol    Types: 21 Standard drinks or equivalent per week    Comment: Occ.   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  No drug use.  Update May 2023-"wine only on the weekends"   regular exercise- no   Social Determinants of Health   Financial Resource Strain: Not on file  Food Insecurity: Not on file  Transportation Needs: Not on file  Physical Activity: Not on file  Stress: Not on file  Social Connections: Not on file  Intimate Partner Violence: Not on file    Review of Systems:  All other review of systems negative except as mentioned in the HPI.  Physical Exam: Vital signs BP (!) 140/74   Pulse 77   Temp (!) 97.1 F (36.2 C) (Temporal)   Ht '5\' 10"'$  (1.778 m)   Wt 230 lb (104.3 kg)   SpO2 96%   BMI 33.00 kg/m   General:   Alert,  Well-developed, well-nourished, pleasant and cooperative in NAD Lungs:  Clear throughout to auscultation.   Heart:  Regular rate and rhythm; no murmurs, clicks, rubs,  or gallops. Abdomen:  Soft, nontender and nondistended. Normal bowel sounds.   Neuro/Psych:  Alert and cooperative. Normal mood and affect. A and O x 3   '@Merridith Dershem'$  Simonne Maffucci, MD, [facility] [phone] (pager) 10/04/2021 10:37 AM@

## 2021-10-07 ENCOUNTER — Telehealth: Payer: Self-pay | Admitting: *Deleted

## 2021-10-07 NOTE — Telephone Encounter (Signed)
Attempt for follow up phone call. No answer at number given.  Left message on voicemail.   

## 2021-10-12 ENCOUNTER — Encounter: Payer: Self-pay | Admitting: Internal Medicine

## 2021-10-14 ENCOUNTER — Other Ambulatory Visit: Payer: Self-pay

## 2021-10-14 ENCOUNTER — Telehealth: Payer: Self-pay | Admitting: Internal Medicine

## 2021-10-14 DIAGNOSIS — R1011 Right upper quadrant pain: Secondary | ICD-10-CM

## 2021-10-14 DIAGNOSIS — K769 Liver disease, unspecified: Secondary | ICD-10-CM

## 2021-10-14 DIAGNOSIS — K7030 Alcoholic cirrhosis of liver without ascites: Secondary | ICD-10-CM

## 2021-10-14 NOTE — Telephone Encounter (Signed)
Spoke with Pt. Documented under result notes 

## 2021-10-14 NOTE — Telephone Encounter (Signed)
Inbound call from patient stating that he was seen in the ER at [facility] on 8/24 for upper abd pain and was transferred to [facility]. Patient stated that he is in need of a MRCP and is wanting to have it done at [facility]. Patient is requesting a call back to discuss. Please advise.

## 2021-10-14 NOTE — Telephone Encounter (Signed)
Thank you for the note -I reviewed the [facility] hospital discharge summary including the following:  "Patient presented with one day history of sharp, persistent RUQ pain radiating to back. Lactate elevated to 2.6, normalized with fluids. AST stable at baseline, alk phos normalized from 134. Tbili uptrended to 5.7 from 2.6. CTAP initially with SMV thrombosis; however, subsequently with addendum stating no peripheral mesenteric venous thrombosis. No heparin was started. Additional CTAP findings included: kidneys with punctate nonobstructive kidney stones bilaterally; unlikely to contribute to current presentation. Pancreas was unremarkable; however, lipase elevated to 87. Gallbladder hydropic with cholelithiasis, no wall thickening, pericholecystic fluid or biliary ductal dilation. Hypoattenuating lesions throughout liver with patent hepatic and portal veins. US Liver doppler as unremarkable. Pain subsequently resolved. Given complete pain resolution with known cholelithiasis, normalized alk phos, and inc Tbili, query if choledocholithiasis was initially present. However, not visualized on CT and now resolved. Hepatology consulted with plan for MRI/MRCP to better access vasculature, biliary tree, and hypoattenuating lesions. Given symptoms resolution, patient elected to pursue MRI/MRCP outpatient. Tbili was downtrending. "  _______________________  Please arrange an MRI abdomen/MRCP (next available) and arrange lab draw for hepatic function panel under Dr. Celesta Aver name for a week from today.  - H. Danis.

## 2021-10-14 NOTE — Telephone Encounter (Signed)
Pt made aware of Dr. Loletha Carrow recommendations: Pt was scheduled for MRCP on 10/19/2021 at [facility] at 10:00 AM: Pt to arrive at 9:30: Nothing to eat or drink 4 hours prior: Pt made aware: Orders for lab placed in Epic for 1 week: Pt made aware: Location to lab given:  Pt verbalized understanding with all questions answered.

## 2021-10-14 NOTE — Telephone Encounter (Signed)
Dr. Carlean Purl Pt Pt stated that he  recently went to [facility] for abdominal pain  and was transferred to [facility] after they did a CTAP: CTAP initially with SMV thrombosis; however, subsequently with addendum stating no peripheral mesenteric venous thrombosis.Pt stated that he was admitted from 10/10/2021 -10/12/2021: Pt stated that he was asymtomatic for the last couple days in the hospital: Pt was waiting for MRCP to be completed while admitted but stated that the Dr's told them this could take several days due to staffing: Pt was discharged with the understanding to contact his GI Dr to facilitate ordering the MRCP outpt: Pt states that he has no pain today: Please review pt chart and advise as DOD

## 2021-10-16 ENCOUNTER — Encounter: Payer: Self-pay | Admitting: Internal Medicine

## 2021-10-16 ENCOUNTER — Ambulatory Visit: Admission: RE | Admit: 2021-10-16 | Payer: Managed Care, Other (non HMO) | Source: Ambulatory Visit

## 2021-10-16 ENCOUNTER — Telehealth: Payer: Self-pay | Admitting: Internal Medicine

## 2021-10-16 NOTE — Telephone Encounter (Signed)
PT has MRCP scheduled for today at 4. It was cancelled because it needed a prior authorization. He is wanting to have it put back on the schedule because he has blood in his urine. He has already called [facility] to hold the spot. Please advise.

## 2021-10-16 NOTE — Telephone Encounter (Unsigned)
Pt originally scheduled for MRI/MRCP on 10/19/2021: Pt stated that he could not make that appty and number was provided to radiology to reschedule: Pt stated that he was scheduled for [facility] and later notified   that his insurance was not covering the test: Spoke with our Pre certification Dept and tried to resolve this issue going back and forth between scheduling dept and Pre certification:Pt stated that he was tring to get a 4:00 Spot at [facility]: While trying to figure out the issue with pt insurance and get this issue resolved:  pt called insurance company and got them to see if he could be done elsewhere. After much time trying to resolve this issue our precert team noticed that pt was now approved for [facility]: Pt contacted and stated that he called his insurance company and they told pt that he could have it done there and pre auth was given for there. Pt requesting order to be faxed there. After being on hold for 20 minutes: a call back was requested: Call back never received:  Pt made aware that I was unable to contact there office  Phone:[phone] provided for pt to try and reach to receive a fax number to provide order:  Pt verbalized understanding with all questions answered.

## 2021-10-17 NOTE — Telephone Encounter (Signed)
Fax sent as requested

## 2021-10-17 NOTE — Telephone Encounter (Signed)
Pt provided Fax Number to [facility] through My Chart: Order was faxed. Pt made aware  Pt verbalized understanding with all questions answered.

## 2021-10-18 ENCOUNTER — Telehealth: Payer: Self-pay | Admitting: Internal Medicine

## 2021-10-18 NOTE — Telephone Encounter (Signed)
[facility] is calling for pt to have MRI of abdomen, mrcp w/ and w/out contrast. Pt is there now waiting. [phone] ext [phone], wanting verbal consent.

## 2021-10-18 NOTE — Telephone Encounter (Signed)
Spoke with [facility]: Verbal Consent was given for MRCP:  Fax was resent to [facility] with orders [facility] verbalized understanding with all questions answered.

## 2021-10-19 ENCOUNTER — Ambulatory Visit (HOSPITAL_COMMUNITY): Payer: Managed Care, Other (non HMO)

## 2021-10-22 ENCOUNTER — Other Ambulatory Visit (INDEPENDENT_AMBULATORY_CARE_PROVIDER_SITE_OTHER): Payer: Managed Care, Other (non HMO)

## 2021-10-22 DIAGNOSIS — R1011 Right upper quadrant pain: Secondary | ICD-10-CM

## 2021-10-22 DIAGNOSIS — K769 Liver disease, unspecified: Secondary | ICD-10-CM | POA: Diagnosis not present

## 2021-10-22 DIAGNOSIS — K7030 Alcoholic cirrhosis of liver without ascites: Secondary | ICD-10-CM | POA: Diagnosis not present

## 2021-10-22 LAB — HEPATIC FUNCTION PANEL
ALT: 43 U/L (ref 0–53)
AST: 93 U/L — ABNORMAL HIGH (ref 0–37)
Albumin: 3.4 g/dL — ABNORMAL LOW (ref 3.5–5.2)
Alkaline Phosphatase: 86 U/L (ref 39–117)
Bilirubin, Direct: 1 mg/dL — ABNORMAL HIGH (ref 0.0–0.3)
Total Bilirubin: 2.5 mg/dL — ABNORMAL HIGH (ref 0.2–1.2)
Total Protein: 7.1 g/dL (ref 6.0–8.3)

## 2021-10-24 ENCOUNTER — Encounter: Payer: Self-pay | Admitting: Internal Medicine

## 2021-10-28 ENCOUNTER — Ambulatory Visit (INDEPENDENT_AMBULATORY_CARE_PROVIDER_SITE_OTHER): Payer: Managed Care, Other (non HMO) | Admitting: Family Medicine

## 2021-10-28 ENCOUNTER — Encounter: Payer: Self-pay | Admitting: Family Medicine

## 2021-10-28 VITALS — BP 116/62 | HR 81 | Temp 98.4°F | Ht 69.75 in | Wt 228.0 lb

## 2021-10-28 DIAGNOSIS — Z0000 Encounter for general adult medical examination without abnormal findings: Secondary | ICD-10-CM | POA: Diagnosis not present

## 2021-10-28 DIAGNOSIS — Z23 Encounter for immunization: Secondary | ICD-10-CM

## 2021-10-28 DIAGNOSIS — Z125 Encounter for screening for malignant neoplasm of prostate: Secondary | ICD-10-CM

## 2021-10-28 DIAGNOSIS — Z13220 Encounter for screening for lipoid disorders: Secondary | ICD-10-CM

## 2021-10-28 NOTE — Progress Notes (Signed)
Kyle Mercer T. Tyishia Aune, MD, [place] at [facility] [place], [address]  Phone: [phone]  FAX: [place] - 51 y.o. male  MRN [id number]  Date of Birth: 08-07-1970  Date: 10/28/2021  PCP: Owens Loffler, MD  Referral: Owens Loffler, MD  Chief Complaint  Patient presents with   Annual Exam   Patient Care Team: Owens Loffler, MD as PCP - General Subjective:   Kyle Mercer is a 51 y.o. pleasant patient who presents with the following:  Preventative Health Maintenance Visit:  Health Maintenance Summary Reviewed and updated, unless pt declines services.  Tobacco History Reviewed. Alcohol: No concerns, no excessive use Exercise Habits: Some activity, rec at least 30 mins 5 times a week STD concerns: no risk or activity to increase risk Drug Use: None  Recent hospitalization at [facility] for abdominal pain on October 10, 2021. No real source of abdominal pain clearly identified.  Does continue to have cirrhosis, visualized most closely on MRCP.  Completely off of alcohol, has lost seven pounds in 3 weeks.  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 When through five months of PT, dry needling.  Problems even putting on socks.  - some ETOH for pain control.   Health Maintenance  Topic Date Due   COVID-19 Vaccine (5 - Pfizer risk series) 08/25/2020   TETANUS/TDAP  11/23/2022   COLONOSCOPY (Pts 45-32yr Insurance coverage will need to be confirmed)  06/01/2026   INFLUENZA VACCINE  Completed   Hepatitis C Screening  Completed   HIV Screening  Completed   Zoster Vaccines- Shingrix  Completed   HPV VACCINES  Aged Out   Immunization History  Administered Date(s) Administered   Hep A / Hep B 02/23/2019, 03/28/2019   Influenza Inj Mdck Quad Pf 01/02/2020   Influenza,inj,Quad PF,6+ Mos 11/22/2012, 11/10/2016, 11/11/2017, 10/28/2021    Influenza,inj,quad, With Preservative 03/05/2015   Influenza-Unspecified 11/16/2018   PFIZER(Purple Top)SARS-COV-2 Vaccination 05/08/2019, 05/29/2019, 12/03/2019, 06/30/2020   Tdap 11/22/2012   Zoster Recombinat (Shingrix) 09/10/2020, 11/13/2020   Patient Active Problem List   Diagnosis Date Noted   Alcoholic cirrhosis of liver without ascites ([place] 08/18/2019    Priority: High   Liver lesion hepatic subsegment four? 02/03/2019    Priority: Medium    Thrombocytopenia ([place] 02/03/2019    Priority: Medium    SLEEP APNEA 06/25/2009    Priority: Low   Allergic rhinitis 06/25/2008    Priority: Low   GERD 06/25/2008    Priority: Low   NEPHROLITHIASIS, HX OF 06/25/2008    Priority: Low   Hx of adenomatous and sessile serrated colonic polyps 06/11/2021   Anxiety state 06/25/2008    Past Medical History:  Diagnosis Date   Alcoholic cirrhosis of liver without ascites ([place] 08/18/2019   Anxiety    on meds   GERD (gastroesophageal reflux disease)    takes OTC meds PRN   History of nephrolithiasis    Hx of adenomatous and sessile serrated colonic polyps 06/11/2021   Hyperlipidemia    diet changes encouraged   Seasonal allergies    Sleep apnea    uses CPAP   Substance abuse ([place]    H/O ETOH   Thrombocytopenia ([place] 02/03/2019    Past Surgical History:  Procedure Laterality Date   COLONOSCOPY     ESOPHAGOGASTRODUODENOSCOPY  10/2018   ROTATOR CUFF REPAIR Right    UPPER  GASTROINTESTINAL ENDOSCOPY      Family History  Problem Relation Age of Onset   Diabetes Mother    Heart disease Mother    Colon polyps Father    Diabetes Father    Heart disease Father    Diabetes Brother    Esophageal cancer Neg Hx    Pancreatic cancer Neg Hx    Stomach cancer Neg Hx    Liver disease Neg Hx    Colon cancer Neg Hx    Crohn's disease Neg Hx    Rectal cancer Neg Hx    Ulcerative colitis Neg Hx     Social History   Social History Narrative   Married, 1 biologic son born 40, 1  daughter by marriage born 1982 when he has grandchildren    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  No drug use.  Update May 2023-"wine only on the weekends"   regular exercise- no    Past Medical History, Surgical History, Social History, Family History, Problem List, Medications, and Allergies have been reviewed and updated if relevant.  Review of Systems: Pertinent positives are listed above.  Otherwise, a full 14 point review of systems has been done in full and it is negative except where it is noted positive.  Objective:   BP 116/62   Pulse 81   Temp 98.4 F (36.9 C) (Oral)   Ht 5' 9.75" (1.772 m)   Wt 228 lb (103.4 kg)   SpO2 96%   BMI 32.95 kg/m  Ideal Body Weight: Weight in (lb) to have BMI = 25: 172.6  Ideal Body Weight: Weight in (lb) to have BMI = 25: 172.6 No results found.    10/28/2021    2:09 PM 08/17/2019    2:40 PM 11/11/2017    8:56 AM 11/10/2016    3:21 PM  Depression screen PHQ 2/9  Decreased Interest 0 0 0 0  Down, Depressed, Hopeless 0 0 0 0  PHQ - 2 Score 0 0 0 0     GEN: well developed, well nourished, no acute distress Eyes: conjunctiva and lids normal, PERRLA, EOMI ENT: TM clear, nares clear, oral exam WNL Neck: supple, no lymphadenopathy, no thyromegaly, no JVD Pulm: clear to auscultation and percussion, respiratory effort normal CV: regular rate and rhythm, S1-S2, no murmur, rub or gallop, no bruits, peripheral pulses normal and symmetric, no cyanosis, clubbing, edema or varicosities GI: soft, non-tender; no hepatosplenomegaly, masses; active bowel sounds all quadrants GU: deferred Lymph: no cervical, axillary or inguinal adenopathy MSK: gait normal, muscle tone and strength WNL, no joint swelling, effusions, discoloration, crepitus  SKIN: clear, good turgor, color WNL, no rashes,  lesions, or ulcerations Neuro: normal mental status, normal strength, sensation, and motion Psych: alert; oriented to person, place and time, normally interactive and not anxious or depressed in appearance.  All labs reviewed with patient. Results for orders placed or performed in visit on 10/22/21  Hepatic function panel  Result Value Ref Range   Total Bilirubin 2.5 (H) 0.2 - 1.2 mg/dL   Bilirubin, Direct 1.0 (H) 0.0 - 0.3 mg/dL   Alkaline Phosphatase 86 39 - 117 U/L   AST 93 (H) 0 - 37 U/L   ALT 43 0 - 53 U/L   Total Protein 7.1 6.0 - 8.3 g/dL   Albumin 3.4 (L) 3.5 -  5.2 g/dL    Assessment and Plan:     ICD-10-CM   1. Healthcare maintenance  Z00.00 Flu Vaccine QUAD 6+ mos PF IM (Fluarix Quad PF)    2. Screening, lipid  Z13.220 Lipid panel    3. Screening PSA (prostate specific antigen)  Z12.5 PSA, Total with Reflex to PSA, Free    4. Need for influenza vaccination  Z23 Flu Vaccine QUAD 6+ mos PF IM (Fluarix Quad PF)     I heartily congratulated him about coming off of alcohol.  He has been feeling better off of it, and he is also lost 7 or 8 pounds.  Think that he has to do this indefinitely for the maintenance of his liver and cirrhosis.  We will also check a cholesterol panel and PSA today.  Health Maintenance Exam: The patient's preventative maintenance and recommended screening tests for an annual wellness exam were reviewed in full today. Brought up to date unless services declined.  Counselled on the importance of diet, exercise, and its role in overall health and mortality. The patient's FH and SH was reviewed, including their home life, tobacco status, and drug and alcohol status.  Follow-up in 1 year for physical exam or additional follow-up below.  Disposition: No follow-ups on file.  No orders of the defined types were placed in this encounter.  Medications Discontinued During This Encounter  Medication Reason   fluconazole (DIFLUCAN) 150 MG tablet  Completed Course   Orders Placed This Encounter  Procedures   Flu Vaccine QUAD 6+ mos PF IM (Fluarix Quad PF)   Lipid panel   PSA, Total with Reflex to PSA, Free    Signed,  Cici Rodriges T. Sareen Randon, MD   Allergies as of 10/28/2021       Reactions   Cefdinir    REACTION: cramping in stomach, vomiting and diarrhea        Medication List        Accurate as of October 28, 2021  2:46 PM. If you have any questions, ask your nurse or doctor.          STOP taking these medications    fluconazole 150 MG tablet Commonly known as: DIFLUCAN Stopped by: Owens Loffler, MD       TAKE these medications    famotidine 20 MG tablet Commonly known as: PEPCID Take 20 mg by mouth daily as needed for heartburn or indigestion.   fluticasone 50 MCG/ACT nasal spray Commonly known as: FLONASE PLACE 2 SPRAYS INTO THE NOSE ONCE DAILY.   sertraline 50 MG tablet Commonly known as: ZOLOFT TAKE 1 TABLET BY MOUTH EVERY DAY

## 2021-10-29 LAB — LIPID PANEL
Cholesterol: 194 mg/dL (ref 0–200)
HDL: 37.9 mg/dL — ABNORMAL LOW (ref >39.00)
LDL Cholesterol: 132 mg/dL — ABNORMAL HIGH (ref 0–99)
NonHDL: 156.08
Total CHOL/HDL Ratio: 5
Triglycerides: 120 mg/dL (ref 0.0–149.0)
VLDL: 24 mg/dL (ref 0.0–40.0)

## 2021-10-30 LAB — PSA, TOTAL WITH REFLEX TO PSA, FREE: PSA, Total: 0.2 ng/mL (ref <=4.0)

## 2022-01-22 ENCOUNTER — Other Ambulatory Visit: Payer: Self-pay | Admitting: Family Medicine

## 2022-01-22 ENCOUNTER — Telehealth: Payer: Self-pay | Admitting: Family Medicine

## 2022-01-22 NOTE — Telephone Encounter (Signed)
I don't fully understand this message.  Is this an abnormal sleep question or does it relate to his gastrointestinal system in some way?

## 2022-01-22 NOTE — Telephone Encounter (Signed)
Patient called to see if something can be done sleeping habits and wanted to know he should follow up with his Gastro Doctor or if [facility] can help. Call back number [phone].

## 2022-01-23 NOTE — Telephone Encounter (Signed)
Patient was saying he was having sleep habit issues and he wanted to know if something can be done about it with seeing you or should he see his [facility] Doctor because he does have liver issues he stated.

## 2022-01-23 NOTE — Telephone Encounter (Signed)
I called pt again but unable to speak with pt; will await pt calling me back and I will try pt again at a later time.

## 2022-01-23 NOTE — Telephone Encounter (Signed)
I was unable to speak with pt and left v/m requesting pt to call [facility] triage for additional information. Sending to [place] triage.

## 2022-01-24 NOTE — Telephone Encounter (Signed)
I spoke with pt; pt said he is having a hard time getting to sleep; pt starts preparing each night between 10 PM and 11 PM to start to wind down. Pt said it takes several hrs to go to sleep usually goes to sleep around 12;30 - 2:00 AM. After pt goes to sleep he usually rest well with occasional get  up to urinate but then pt can go right back to sleep. Pt said for years he listens to noise on TV but does not concentrate or watch what is on the TV after he lays down to go to sleep. Pt said he usually gets enough sleep because in the morning he does not want to get up and may sleep 10 -12 hrs depending on his schedule. Pt said he has fatigue even though he gets sleep.  Pt does not have H/A, CP or SOB. Pt said he does have chronic neck and back pain for years and that has pt turning from side to side for finding a comfortable position. Pt said he has a good mattress in a sleep number bed. Pt wonders if liver issues could be causing fatigue as well. Pt scheduled appt with Dr Lorelei Pont on 01/29/22 at 3:20 pm (this was first appt that met pts scheduling needs.) UC & ED precautions given and pt voiced understanding. Sending note to Dr Lorelei Pont and [facility] pool.

## 2022-01-24 NOTE — Telephone Encounter (Signed)
Pt said that he is out of Sertraline and hopes refill can be done today; 01/24/22. Pt said [facility] would text pt when ready for pick up. Pt said he had annual exam on 10/28/2021. Will send note to [facility] and let her be aware.

## 2022-01-26 NOTE — Telephone Encounter (Signed)
Will review face to face with him.

## 2022-01-28 NOTE — Progress Notes (Unsigned)
    Kyle Mercer T. Kyle Flenniken, MD, [place] at [facility] [place], [address]  Phone: [phone]  FAX: [place] - 51 y.o. male  MRN [id number]  Date of Birth: 01/31/1971  Date: 01/29/2022  PCP: Owens Loffler, MD  Referral: Owens Loffler, MD  No chief complaint on file.  Subjective:   Kyle Mercer is a 51 y.o. very pleasant male patient with There is no height or weight on file to calculate BMI. who presents with the following:  The patient presents with abnormal sleep patterns and insomnia.     Review of Systems is noted in the HPI, as appropriate  Objective:   There were no vitals taken for this visit.  GEN: No acute distress; alert,appropriate. PULM: Breathing comfortably in no respiratory distress PSYCH: Normally interactive.   Laboratory and Imaging Data:  Assessment and Plan:   ***

## 2022-01-29 ENCOUNTER — Ambulatory Visit (INDEPENDENT_AMBULATORY_CARE_PROVIDER_SITE_OTHER): Payer: Managed Care, Other (non HMO) | Admitting: Family Medicine

## 2022-01-29 ENCOUNTER — Encounter: Payer: Self-pay | Admitting: Family Medicine

## 2022-01-29 VITALS — BP 120/60 | HR 76 | Temp 98.4°F | Ht 69.75 in | Wt 221.5 lb

## 2022-01-29 DIAGNOSIS — K7030 Alcoholic cirrhosis of liver without ascites: Secondary | ICD-10-CM

## 2022-01-29 DIAGNOSIS — K769 Liver disease, unspecified: Secondary | ICD-10-CM | POA: Diagnosis not present

## 2022-01-29 DIAGNOSIS — R5383 Other fatigue: Secondary | ICD-10-CM | POA: Diagnosis not present

## 2022-01-29 DIAGNOSIS — D696 Thrombocytopenia, unspecified: Secondary | ICD-10-CM

## 2022-01-29 DIAGNOSIS — G479 Sleep disorder, unspecified: Secondary | ICD-10-CM

## 2022-01-30 ENCOUNTER — Encounter: Payer: Self-pay | Admitting: Family Medicine

## 2022-01-31 LAB — CBC WITH DIFFERENTIAL/PLATELET

## 2022-02-01 LAB — CBC WITH DIFFERENTIAL/PLATELET
Basophils Absolute: 0.1 x10E3/uL (ref 0.0–0.2)
Basos: 1 %
EOS (ABSOLUTE): 0.1 x10E3/uL (ref 0.0–0.4)
Eos: 3 %
Hematocrit: 39.1 % (ref 37.5–51.0)
Hemoglobin: 13.7 g/dL (ref 13.0–17.7)
Immature Grans (Abs): 0 x10E3/uL (ref 0.0–0.1)
Immature Granulocytes: 0 %
Lymphocytes Absolute: 1.2 x10E3/uL (ref 0.7–3.1)
Lymphs: 32 %
MCH: 34 pg — ABNORMAL HIGH (ref 26.6–33.0)
MCHC: 35 g/dL (ref 31.5–35.7)
MCV: 97 fL (ref 79–97)
Monocytes Absolute: 0.4 x10E3/uL (ref 0.1–0.9)
Monocytes: 11 %
Neutrophils Absolute: 2.1 x10E3/uL (ref 1.4–7.0)
Neutrophils: 53 %
Platelets: 59 x10E3/uL — CL (ref 150–450)
RBC: 4.03 x10E6/uL — ABNORMAL LOW (ref 4.14–5.80)
RDW: 12.3 % (ref 11.6–15.4)
WBC: 3.9 x10E3/uL (ref 3.4–10.8)

## 2022-02-01 LAB — T3, FREE: T3, Free: 2.6 pg/mL (ref 2.0–4.4)

## 2022-02-01 LAB — BASIC METABOLIC PANEL
BUN/Creatinine Ratio: 9 (ref 9–20)
BUN: 7 mg/dL (ref 6–24)
CO2: 25 mmol/L (ref 20–29)
Calcium: 9.2 mg/dL (ref 8.7–10.2)
Chloride: 104 mmol/L (ref 96–106)
Creatinine, Ser: 0.8 mg/dL (ref 0.76–1.27)
Glucose: 102 mg/dL — ABNORMAL HIGH (ref 70–99)
Potassium: 3.9 mmol/L (ref 3.5–5.2)
Sodium: 138 mmol/L (ref 134–144)
eGFR: 107 mL/min/{1.73_m2} (ref >59)

## 2022-02-01 LAB — HEPATIC FUNCTION PANEL
ALT: 31 IU/L (ref 0–44)
AST: 68 IU/L — ABNORMAL HIGH (ref 0–40)
Albumin: 3.8 g/dL (ref 3.8–4.9)
Alkaline Phosphatase: 101 IU/L (ref 44–121)
Bilirubin Total: 2.5 mg/dL — ABNORMAL HIGH (ref 0.0–1.2)
Bilirubin, Direct: 0.93 mg/dL — ABNORMAL HIGH (ref 0.00–0.40)
Total Protein: 7.2 g/dL (ref 6.0–8.5)

## 2022-02-01 LAB — PROTIME-INR
INR: 1.3 — ABNORMAL HIGH (ref 0.9–1.2)
Prothrombin Time: 13.5 s — ABNORMAL HIGH (ref 9.1–12.0)

## 2022-02-01 LAB — IRON,TIBC AND FERRITIN PANEL
Ferritin: 299 ng/mL (ref 30–400)
Iron Saturation: 73 % — ABNORMAL HIGH (ref 15–55)
Iron: 202 ug/dL — ABNORMAL HIGH (ref 38–169)
Total Iron Binding Capacity: 275 ug/dL (ref 250–450)
UIBC: 73 ug/dL — ABNORMAL LOW (ref 111–343)

## 2022-02-01 LAB — EBV AB TO VIRAL CAPSID AG PNL, IGG+IGM
EBV VCA IgG: >600 U/mL — ABNORMAL HIGH (ref 0.0–17.9)
EBV VCA IgM: <36 U/mL (ref 0.0–35.9)

## 2022-02-01 LAB — TSH: TSH: 2.05 u[IU]/mL (ref 0.450–4.500)

## 2022-02-01 LAB — T4, FREE: Free T4: 1.14 ng/dL (ref 0.82–1.77)

## 2022-02-01 LAB — LIPASE: Lipase: 61 U/L (ref 13–78)

## 2022-02-01 LAB — VITAMIN D 25 HYDROXY (VIT D DEFICIENCY, FRACTURES): Vit D, 25-Hydroxy: 23.9 ng/mL — ABNORMAL LOW (ref 30.0–100.0)

## 2022-02-01 LAB — AMMONIA: Ammonia: 84 ug/dL (ref 40–200)

## 2022-02-03 ENCOUNTER — Telehealth: Payer: Self-pay | Admitting: Internal Medicine

## 2022-02-03 MED ORDER — LACTULOSE 10 GM/15ML PO SOLN: 20.0000 g | Freq: Three times a day (TID) | ORAL | 3 refills | Status: DC

## 2022-02-03 NOTE — Telephone Encounter (Signed)
Pt was made aware of Dr. Carlean Purl recommendations: Pt made aware of prescription that was sent to pharmacy: Pt scheduled for an office visit on 02/07/2022 at 9:50 AM: with Dr. Carlean Purl Pt made aware: Pt verbalized understanding with all questions answered.

## 2022-02-03 NOTE — Telephone Encounter (Addendum)
Message from pcp about disturbed sleep and elevated NH3 - sounds like hepatic encephalopathy  I have Rxed lactulose - let patient or wife know that this should help but can cause diarrhea - goal is 3-4 soft stools a day with this  Add him on in an opening 12/22 - need to open all od those held spots up

## 2022-02-07 ENCOUNTER — Ambulatory Visit (INDEPENDENT_AMBULATORY_CARE_PROVIDER_SITE_OTHER): Payer: Managed Care, Other (non HMO) | Admitting: Internal Medicine

## 2022-02-07 ENCOUNTER — Encounter: Payer: Self-pay | Admitting: Internal Medicine

## 2022-02-07 VITALS — BP 122/66 | HR 84 | Ht 69.0 in | Wt 218.0 lb

## 2022-02-07 DIAGNOSIS — G4733 Obstructive sleep apnea (adult) (pediatric): Secondary | ICD-10-CM

## 2022-02-07 DIAGNOSIS — G479 Sleep disorder, unspecified: Secondary | ICD-10-CM

## 2022-02-07 DIAGNOSIS — K7030 Alcoholic cirrhosis of liver without ascites: Secondary | ICD-10-CM | POA: Diagnosis not present

## 2022-02-07 NOTE — Patient Instructions (Signed)
We have placed a referral to [facility] for them to evaluate you for sleep apnea. They will be in touch.   Due to recent changes in healthcare laws, you may see the results of your imaging and laboratory studies on MyChart before your provider has had a chance to review them.  We understand that in some cases there may be results that are confusing or concerning to you. Not all laboratory results come back in the same time frame and the provider may be waiting for multiple results in order to interpret others.  Please give [place] 48 hours in order for your provider to thoroughly review all the results before contacting the office for clarification of your results.    I appreciate the opportunity to care for you. Silvano Rusk, MD

## 2022-02-07 NOTE — Progress Notes (Signed)
Kyle Mercer 51 y.o. 07/31/1970 [id number]  Assessment & Plan:   Encounter Diagnoses  Name Primary?   Sleep disturbance Yes   OSA on CPAP    Alcoholic cirrhosis of liver without ascites ([place])    I explained that I misinterpreted his ammonia level of 84.  That used to be a high level.  At any rate he appropriately did not start lactulose and after talking to him and seeing him today I wonder if his CPAP device is not working properly now and so we will refer him to sleep medicine for reassessment as it has been a number of years since he has been seen.  He is losing some weight so perhaps his device is not fitting properly.  Perhaps there is some contribution from abstinence from alcohol though probably not.  He will continue liver follow-up with [facility].  He is congratulated on abstinence and attention to his health matters.    Subjective:   Chief Complaint: Sleep disturbance  HPI  51 year old white man with alcoholic cirrhosis of the liver diagnosed within the past few years, [facility] but referred by primary care because of altered sleep and question of hepatic encephalopathy.  I had received a message from his primary care Dr. Frederico Hamman Copland regarding this and reviewed the labs and thought perhaps he would have hepatic encephalopathy and empirically started lactulose which the patient did not take.  He had an ammonia level of 84 which is to be an elevated level but the new range is up to 200 so it was normal.  The patient saw this and decided not to take the medication.  What is going on is he is feeling tired and needing to sleep longer.  He has been drinking a lot of water because of kidney stone problems lately and he is up at night several times.  He has good motivation no anhedonia he does not feel depressed.  He has not been confused or excessively sleepy otherwise during the day.  He does have sleep apnea and has been on CPAP for 10  years or so and thinks his original evaluation was at [place] years ago and has not followed up with anybody on that.  He likes his work and he is motivated for that as well.  He has been abstinent from alcohol for months now.  Last drink in August.  He notes that he used to use that as a sedative to help him relax and sleep better and wonders if that is related.  He has been busy providing care for his mother alternating with his sister and has been a stress but that is been going on for a while and knees sleep disturbance symptoms started just in the past few weeks.  January 30, 2022: Thyroid testing was normal bilirubin 2.5 AST 68, persistent thrombocytopenia level 59 and a Bmet was normal.  Ferritin 299 iron saturation 73% which is consistent with alcoholic liver disease vitamin D 23.9. Wt Readings from Last 3 Encounters:  02/07/22 218 lb (98.9 kg)  01/29/22 221 lb 8 oz (100.5 kg)  10/28/21 228 lb (103.4 kg)     Allergies  Allergen Reactions   Cefdinir     REACTION: cramping in stomach, vomiting and diarrhea   Current Meds  Medication Sig   famotidine (PEPCID) 20 MG tablet Take 20 mg by mouth daily as needed for heartburn or indigestion.   fluticasone (FLONASE) 50 MCG/ACT nasal spray PLACE 2 SPRAYS INTO  THE NOSE ONCE DAILY.   sertraline (ZOLOFT) 50 MG tablet TAKE 1 TABLET BY MOUTH EVERY DAY   Past Medical History:  Diagnosis Date   Alcoholic cirrhosis of liver without ascites ([place]) 08/18/2019   Anxiety    on meds   GERD (gastroesophageal reflux disease)    takes OTC meds PRN   History of nephrolithiasis    Hx of adenomatous and sessile serrated colonic polyps 06/11/2021   Hyperlipidemia    Kidney stones    Liver lesion    Seasonal allergies    Sleep apnea    uses CPAP   Substance abuse ([place])    H/O ETOH   Thrombocytopenia ([place]) 02/03/2019   Past Surgical History:  Procedure Laterality Date   COLONOSCOPY     ESOPHAGOGASTRODUODENOSCOPY  10/2018   ROTATOR CUFF REPAIR Right     UPPER GASTROINTESTINAL ENDOSCOPY     Social History   Social History Narrative   Married, 1 biologic son born 58, 1 daughter by marriage born 1982 when he has grandchildren    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  No drug use.  Update May 2023-"wine only on the weekends"   regular exercise- no      Quit alcohol August 2023   family history includes Colon polyps in his father; Diabetes in his brother, father, and mother; Heart disease in his father and mother.   Review of Systems As above  Objective:   Physical Exam BP 122/66   Pulse 84   Ht '5\' 9"'$  (1.753 m)   Wt 218 lb (98.9 kg)   BMI 32.19 kg/m   Well-developed well-nourished white man in no acute distress there is no asterixis.  He is alert and x 3 he can do serial sevens.  Concentration is good.

## 2022-03-03 ENCOUNTER — Ambulatory Visit (INDEPENDENT_AMBULATORY_CARE_PROVIDER_SITE_OTHER): Payer: 59 | Admitting: Adult Health

## 2022-03-03 ENCOUNTER — Encounter: Payer: Self-pay | Admitting: Adult Health

## 2022-03-03 VITALS — BP 126/80 | HR 72 | Temp 98.1°F | Ht 69.5 in | Wt 223.2 lb

## 2022-03-03 DIAGNOSIS — G4733 Obstructive sleep apnea (adult) (pediatric): Secondary | ICD-10-CM

## 2022-03-03 NOTE — Patient Instructions (Signed)
Add SD card for CPAP  Wear CPAP all night long  Healthy sleep regimen as discussed  Work on healthy sleep regimen  Follow up in 4 weeks with CPAP download, bring SD card .

## 2022-03-03 NOTE — Assessment & Plan Note (Signed)
Healthy weight loss discussed.  

## 2022-03-03 NOTE — Assessment & Plan Note (Signed)
Moderate obstructive sleep apnea diagnosed July 2011 has been on CPAP since then.  Reports excellent compliance on CPAP.  Typically feels very well with significant benefit on CPAP with decreased daytime sleepiness.  Has had 1 month of daytime fatigue.  Seems to be improving over the last week per patient.  Will need to have a CPAP download.  SD card has been ordered.  Will have him return in 4 weeks we can discuss his CPAP control at that time.  May need a new CPAP machine as his is 52 years old.  - discussed how weight can impact sleep and risk for sleep disordered breathing - discussed options to assist with weight loss: combination of diet modification, cardiovascular and strength training exercises   - had an extensive discussion regarding the adverse health consequences related to untreated sleep disordered breathing - specifically discussed the risks for hypertension, coronary artery disease, cardiac dysrhythmias, cerebrovascular disease, and diabetes - lifestyle modification discussed   - discussed how sleep disruption can increase risk of accidents, particularly when driving - safe driving practices were discussed   Plan  Patient Instructions  Add SD card for CPAP  Wear CPAP all night long  Healthy sleep regimen as discussed  Work on healthy sleep regimen  Follow up in 4 weeks with CPAP download, bring SD card .

## 2022-03-03 NOTE — Progress Notes (Signed)
$'@Patient'a$  ID: Kyle Mercer, male    DOB: May 02, 1970, 52 y.o.   MRN: [id number]  Chief Complaint  Patient presents with   Consult    Referring provider: Gatha Mayer, MD  HPI: 52 year old male seen for sleep consult March 03, 2022 to establish for sleep apnea  TEST/EVENTS :   03/03/2022 Sleep consult  Patient presents for sleep consult today.  Kindly referred by primary care provider Dr. Lorelei Pont.  Patient says he was diagnosed with sleep apnea around 15 years ago.  Patient was diagnosed at [facility].  Sleep study done on September 10, 2009 showed AHI at 17/hr , titration portion showed optimal control on CPAP 7 cm H2O..  CPAP study is in care everywhere.  Patient says he has been on CPAP since then.  He wears it every single night.  Gets his supplies through online.  His machine is very old, but seems to work okay .   Typically goes to bed about 8 to 9 PM.  Watches TV for around  3 hours, and goes to sleep with TV on, sets sleep timer on TV to go off.   Is up a couple times throughout the night.  Gets up at 10 AM.  Weight is up 15 pounds over the last 2 years current weight is at 223 pounds with a BMI of 32.  No history of congestive heart failure or stroke.  Caffeine intake minimal  Does complain of daytime fatigue that started about 1 month ago.  unclear etiology . Seen by PCP , workup was unrevealing. Has not had CPAP checked in years.  Wears CPAP every night for 9-10 hr. Uses dream wear nasal mask.   Not active, does not exercise  Stopped drinking alcohol few months ago, has Cirrhosis.  Epworth score 7 out of 24 , typically tired watching TV and evening.    PMH  Chronic allergies, sleep apnea, kidney stones, alcoholic cirrhosis quit alcohol August 2023, anxiety, GERD  Surgical history right rotator cuff surgery  Social history patient is married.  Has children.  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  Work hours very.  Does not work night shift.  Quit  smoking 2002.  Quit drinking alcohol August 2023.  No drug history.  Family history positive for heart disease and cardiomyopathy   Allergies  Allergen Reactions   Cefdinir     REACTION: cramping in stomach, vomiting and diarrhea    Immunization History  Administered Date(s) Administered   COVID-19, mRNA, vaccine(Comirnaty)12 years and older 11/25/2021   Hep A / Hep B 02/23/2019, 03/28/2019   Influenza Inj Mdck Quad Pf 01/02/2020   Influenza,inj,Quad PF,6+ Mos 11/22/2012, 11/10/2016, 11/11/2017, 10/28/2021   Influenza,inj,quad, With Preservative 03/05/2015   Influenza-Unspecified 11/16/2018   PFIZER(Purple Top)SARS-COV-2 Vaccination 05/08/2019, 05/29/2019, 12/03/2019, 06/30/2020   Tdap 11/22/2012   Zoster Recombinat (Shingrix) 09/10/2020, 11/13/2020    Past Medical History:  Diagnosis Date   Alcoholic cirrhosis of liver without ascites ([place]) 08/18/2019   Anxiety    on meds   GERD (gastroesophageal reflux disease)    takes OTC meds PRN   History of nephrolithiasis    Hx of adenomatous and sessile serrated colonic polyps 06/11/2021   Hyperlipidemia    Kidney stones    Liver lesion    Seasonal allergies    Sleep apnea    uses CPAP   Substance abuse ([place])    H/O ETOH   Thrombocytopenia ([place]) 02/03/2019    Tobacco  History: Social History   Tobacco Use  Smoking Status Former   Years: 7.00   Types: Cigarettes   Quit date: 02/18/2000   Years since quitting: 22.0   Passive exposure: Past  Smokeless Tobacco Never  Tobacco Comments   3-4 cigarettes per day at the most (recreational).  03/03/2021 hfb   Counseling given: Not Answered Tobacco comments: 3-4 cigarettes per day at the most (recreational).  03/03/2021 hfb   Outpatient Medications Prior to Visit  Medication Sig Dispense Refill   famotidine (PEPCID) 20 MG tablet Take 20 mg by mouth daily as needed for heartburn or indigestion.     fluticasone (FLONASE) 50 MCG/ACT nasal spray PLACE 2 SPRAYS INTO THE NOSE ONCE  DAILY. 48 mL 2   sertraline (ZOLOFT) 50 MG tablet TAKE 1 TABLET BY MOUTH EVERY DAY 90 tablet 1   No facility-administered medications prior to visit.     Review of Systems:   Constitutional:   No  weight loss, night sweats,  Fevers, chills, fatigue, or  lassitude.  HEENT:   No headaches,  Difficulty swallowing,  Tooth/dental problems, or  Sore throat,                No sneezing, itching, ear ache, nasal congestion, post nasal drip,   CV:  No chest pain,  Orthopnea, PND, swelling in lower extremities, anasarca, dizziness, palpitations, syncope.   GI  No heartburn, indigestion, abdominal pain, nausea, vomiting, diarrhea, change in bowel habits, loss of appetite, bloody stools.   Resp: No shortness of breath with exertion or at rest.  No excess mucus, no productive cough,  No non-productive cough,  No coughing up of blood.  No change in color of mucus.  No wheezing.  No chest wall deformity  Skin: no rash or lesions.  GU: no dysuria, change in color of urine, no urgency or frequency.  No flank pain, no hematuria   MS:  No joint pain or swelling.  No decreased range of motion.  No back pain.    Physical Exam  BP 126/80 (BP Location: Left Arm, Patient Position: Sitting, Cuff Size: Large)   Pulse 72   Temp 98.1 F (36.7 C) (Oral)   Ht 5' 9.5" (1.765 m)   Wt 223 lb 3.2 oz (101.2 kg)   SpO2 96%   BMI 32.49 kg/m   GEN: A/Ox3; pleasant , NAD, well nourished    HEENT:  [place]/AT, class II-III MP airway NOSE-clear, THROAT-clear, no lesions, no postnasal drip or exudate noted.   NECK:  Supple w/ fair ROM; no JVD; normal carotid impulses w/o bruits; no thyromegaly or nodules palpated; no lymphadenopathy.    RESP  Clear  P & A; w/o, wheezes/ rales/ or rhonchi. no accessory muscle use, no dullness to percussion  CARD:  RRR, no m/r/g, no peripheral edema, pulses intact, no cyanosis or clubbing.  GI:   Soft & nt; nml bowel sounds; no organomegaly or masses detected.   Musco: Warm bil,  no deformities or joint swelling noted.   Neuro: alert, no focal deficits noted.    Skin: Warm, no lesions or rashes    Lab Results:    BNP No results found for: "BNP"  ProBNP No results found for: "PROBNP"  Imaging: No results found.        No data to display          No results found for: "NITRICOXIDE"      Assessment & Plan:   Sleep apnea Moderate obstructive sleep apnea  diagnosed July 2011 has been on CPAP since then.  Reports excellent compliance on CPAP.  Typically feels very well with significant benefit on CPAP with decreased daytime sleepiness.  Has had 1 month of daytime fatigue.  Seems to be improving over the last week per patient.  Will need to have a CPAP download.  SD card has been ordered.  Will have him return in 4 weeks we can discuss his CPAP control at that time.  May need a new CPAP machine as his is 52 years old.  - discussed how weight can impact sleep and risk for sleep disordered breathing - discussed options to assist with weight loss: combination of diet modification, cardiovascular and strength training exercises   - had an extensive discussion regarding the adverse health consequences related to untreated sleep disordered breathing - specifically discussed the risks for hypertension, coronary artery disease, cardiac dysrhythmias, cerebrovascular disease, and diabetes - lifestyle modification discussed   - discussed how sleep disruption can increase risk of accidents, particularly when driving - safe driving practices were discussed   Plan  Patient Instructions  Add SD card for CPAP  Wear CPAP all night long  Healthy sleep regimen as discussed  Work on healthy sleep regimen  Follow up in 4 weeks with CPAP download, bring SD card .     Morbid obesity ([place]) Healthy weight loss discussed     Rexene Edison, NP 03/03/2022

## 2022-03-05 NOTE — Progress Notes (Signed)
Reviewed and agree with assessment/plan.   Chesley Mires, MD [facility] 03/05/2022, 10:37 AM Pager:  [phone]

## 2022-03-19 ENCOUNTER — Other Ambulatory Visit: Payer: Self-pay | Admitting: Nurse Practitioner

## 2022-03-19 DIAGNOSIS — K7030 Alcoholic cirrhosis of liver without ascites: Secondary | ICD-10-CM

## 2022-03-28 ENCOUNTER — Telehealth: Payer: Self-pay | Admitting: *Deleted

## 2022-03-28 NOTE — Telephone Encounter (Signed)
Called and spoke with patient, requested that patient bring his SD card to his appointment on Monday, 2/12.  He verbalized understanding.  Nothing further needed.

## 2022-03-31 ENCOUNTER — Encounter: Payer: 59 | Admitting: Adult Health

## 2022-03-31 ENCOUNTER — Encounter: Payer: Self-pay | Admitting: Adult Health

## 2022-03-31 VITALS — BP 126/66 | HR 82 | Temp 98.4°F | Ht 69.5 in | Wt 220.0 lb

## 2022-03-31 NOTE — Progress Notes (Signed)
Error

## 2022-06-24 ENCOUNTER — Ambulatory Visit
Admission: EM | Admit: 2022-06-24 | Discharge: 2022-06-24 | Disposition: A | Discharge status: Home / Self Care | Payer: 59 | Attending: Physician Assistant | Admitting: Physician Assistant

## 2022-06-24 DIAGNOSIS — R590 Localized enlarged lymph nodes: Secondary | ICD-10-CM | POA: Insufficient documentation

## 2022-06-24 DIAGNOSIS — R109 Unspecified abdominal pain: Secondary | ICD-10-CM | POA: Insufficient documentation

## 2022-06-24 DIAGNOSIS — K7030 Alcoholic cirrhosis of liver without ascites: Secondary | ICD-10-CM | POA: Insufficient documentation

## 2022-06-24 DIAGNOSIS — A084 Viral intestinal infection, unspecified: Secondary | ICD-10-CM | POA: Insufficient documentation

## 2022-06-24 DIAGNOSIS — R197 Diarrhea, unspecified: Secondary | ICD-10-CM | POA: Diagnosis present

## 2022-06-24 DIAGNOSIS — R112 Nausea with vomiting, unspecified: Secondary | ICD-10-CM | POA: Insufficient documentation

## 2022-06-24 LAB — CBC WITH DIFFERENTIAL/PLATELET
Abs Immature Granulocytes: 0.01 10*3/uL (ref 0.00–0.07)
Basophils Absolute: 0.1 10*3/uL (ref 0.0–0.1)
Basophils Relative: 1 %
Eosinophils Absolute: 0.1 10*3/uL (ref 0.0–0.5)
Eosinophils Relative: 3 %
HCT: 40.5 % (ref 39.0–52.0)
Hemoglobin: 14.4 g/dL (ref 13.0–17.0)
Immature Granulocytes: 0 %
Lymphocytes Relative: 25 %
Lymphs Abs: 1 10*3/uL (ref 0.7–4.0)
MCH: 34.6 pg — ABNORMAL HIGH (ref 26.0–34.0)
MCHC: 35.6 g/dL (ref 30.0–36.0)
MCV: 97.4 fL (ref 80.0–100.0)
Monocytes Absolute: 0.5 10*3/uL (ref 0.1–1.0)
Monocytes Relative: 13 %
Neutro Abs: 2.2 10*3/uL (ref 1.7–7.7)
Neutrophils Relative %: 58 %
Platelets: 52 10*3/uL — ABNORMAL LOW (ref 150–400)
RBC: 4.16 MIL/uL — ABNORMAL LOW (ref 4.22–5.81)
RDW: 13.1 % (ref 11.5–15.5)
WBC: 3.9 10*3/uL — ABNORMAL LOW (ref 4.0–10.5)
nRBC: 0 % (ref 0.0–0.2)

## 2022-06-24 LAB — COMPREHENSIVE METABOLIC PANEL
ALT: 24 U/L (ref 0–44)
AST: 59 U/L — ABNORMAL HIGH (ref 15–41)
Albumin: 3.5 g/dL (ref 3.5–5.0)
Alkaline Phosphatase: 89 U/L (ref 38–126)
Anion gap: 6 (ref 5–15)
BUN: 10 mg/dL (ref 6–20)
CO2: 24 mmol/L (ref 22–32)
Calcium: 9 mg/dL (ref 8.9–10.3)
Chloride: 103 mmol/L (ref 98–111)
Creatinine, Ser: 0.68 mg/dL (ref 0.61–1.24)
GFR, Estimated: >60 mL/min (ref >60)
Glucose, Bld: 122 mg/dL — ABNORMAL HIGH (ref 70–99)
Potassium: 3.6 mmol/L (ref 3.5–5.1)
Sodium: 133 mmol/L — ABNORMAL LOW (ref 135–145)
Total Bilirubin: 4.7 mg/dL — ABNORMAL HIGH (ref 0.3–1.2)
Total Protein: 7.1 g/dL (ref 6.5–8.1)

## 2022-06-24 LAB — LIPASE, BLOOD: Lipase: 58 U/L — ABNORMAL HIGH (ref 11–51)

## 2022-06-24 LAB — GROUP A STREP BY PCR: Group A Strep by PCR: NOT DETECTED

## 2022-06-24 MED ORDER — ONDANSETRON 4 MG PO TBDP: 4.0000 mg | ORAL_TABLET | Freq: Three times a day (TID) | ORAL | 0 refills | Status: DC | PRN

## 2022-06-24 MED ORDER — DICYCLOMINE HCL 20 MG PO TABS: 20.0000 mg | ORAL_TABLET | Freq: Three times a day (TID) | ORAL | 0 refills | Status: DC

## 2022-06-24 NOTE — ED Provider Notes (Signed)
[facility]    CSN: [id number] Arrival date & time: 06/24/22  2130      History   Chief Complaint Chief Complaint  Patient presents with   Emesis   Diarrhea    HPI Kyle Mercer is a 52 y.o. male presenting for 2-day history of diarrhea, nausea with an episode of vomiting, fatigue, swollen lymph node to the left side of his throat.  Patient denies sore throat but says when he swallows he has discomfort in the lymph node.  He reports cough and congestion related to allergies.  He denies fever.  He says he has diffuse abdominal cramping but mostly of the right upper quadrant.  He denies any sick contacts.  No recent travel.  He has been exposed to strep but that was 2 weeks ago.  Patient has not taken anything for symptoms.  He does have a history of alcoholic cirrhosis.  He has an appointment with radiology in 2 days for an ultrasound of his liver.  He follows up with a liver specialist.  Other medical history significant for nephrolithiasis, hyperlipidemia, sleep apnea and anxiety.  HPI  Past Medical History:  Diagnosis Date   Alcoholic cirrhosis of liver without ascites (HCC) 08/18/2019   Anxiety    on meds   GERD (gastroesophageal reflux disease)    takes OTC meds PRN   History of nephrolithiasis    Hx of adenomatous and sessile serrated colonic polyps 06/11/2021   Hyperlipidemia    Kidney stones    Liver lesion    Seasonal allergies    Sleep apnea    uses CPAP   Substance abuse (HCC)    H/O ETOH   Thrombocytopenia (HCC) 02/03/2019    Patient Active Problem List   Diagnosis Date Noted   Morbid obesity (HCC) 03/03/2022   Hx of adenomatous and sessile serrated colonic polyps 06/11/2021   Alcoholic cirrhosis of liver without ascites (HCC) 08/18/2019   Liver lesion hepatic subsegment four? 02/03/2019   Thrombocytopenia (HCC) 02/03/2019   Sleep apnea 06/25/2009   Anxiety state 06/25/2008   Allergic rhinitis 06/25/2008   GERD 06/25/2008    NEPHROLITHIASIS, HX OF 06/25/2008    Past Surgical History:  Procedure Laterality Date   COLONOSCOPY     ESOPHAGOGASTRODUODENOSCOPY  10/2018   ROTATOR CUFF REPAIR Right    UPPER GASTROINTESTINAL ENDOSCOPY         Home Medications    Prior to Admission medications   Medication Sig Start Date End Date Taking? Authorizing Provider  dicyclomine (BENTYL) 20 MG tablet Take 1 tablet (20 mg total) by mouth 3 (three) times daily before meals for 5 days. 06/24/22 06/29/22 Yes Eusebio Friendly B, PA-C  ondansetron (ZOFRAN-ODT) 4 MG disintegrating tablet Take 1 tablet (4 mg total) by mouth every 8 (eight) hours as needed for nausea or vomiting. 06/24/22  Yes Shirlee Latch, PA-C  famotidine (PEPCID) 20 MG tablet Take 20 mg by mouth daily as needed for heartburn or indigestion.    [provider]  fluticasone (FLONASE) 50 MCG/ACT nasal spray PLACE 2 SPRAYS INTO THE NOSE ONCE DAILY. 05/24/21   Copland, Karleen Hampshire, MD  sertraline (ZOLOFT) 50 MG tablet TAKE 1 TABLET BY MOUTH EVERY DAY 01/24/22   Copland, Karleen Hampshire, MD    Family History Family History  Problem Relation Age of Onset   Diabetes Mother    Heart disease Mother    Colon polyps Father    Diabetes Father    Heart disease Father  Diabetes Brother    Esophageal cancer Neg Hx    Pancreatic cancer Neg Hx    Stomach cancer Neg Hx    Liver disease Neg Hx    Colon cancer Neg Hx    Crohn's disease Neg Hx    Rectal cancer Neg Hx    Ulcerative colitis Neg Hx     Social History Social History   Tobacco Use   Smoking status: Former    Years: 7    Types: Cigarettes    Quit date: 02/18/2000    Years since quitting: 22.3    Passive exposure: Past   Smokeless tobacco: Never   Tobacco comments:    3-4 cigarettes per day at the most (recreational).  03/03/2021 hfb  Vaping Use   Vaping Use: Never used  Substance Use Topics   Alcohol use: Not Currently    Alcohol/week: 21.0 standard drinks of alcohol    Types: 21 Standard drinks or  equivalent per week   Drug use: No     Allergies   Cefdinir   Review of Systems Review of Systems  Constitutional:  Positive for fatigue. Negative for fever.  HENT:  Positive for congestion and rhinorrhea. Negative for ear pain, sinus pressure, sinus pain and sore throat.   Respiratory:  Positive for cough. Negative for shortness of breath.   Cardiovascular:  Negative for chest pain.  Gastrointestinal:  Positive for abdominal pain, diarrhea, nausea and vomiting (x1).  Musculoskeletal:  Negative for myalgias.  Neurological:  Negative for weakness, light-headedness and headaches.  Hematological:  Positive for adenopathy.     Physical Exam Triage Vital Signs ED Triage Vitals  Enc Vitals Group     BP 06/24/22 0909 (!) 165/93     Pulse Rate 06/24/22 0909 70     Resp 06/24/22 0909 20     Temp 06/24/22 0909 98.7 F (37.1 C)     Temp src --      SpO2 06/24/22 0909 100 %     Weight --      Height --      Head Circumference --      Peak Flow --      Pain Score 06/24/22 0908 5     Pain Loc --      Pain Edu? --      Excl. in GC? --    No data found.  Updated Vital Signs BP (!) 165/93   Pulse 70   Temp 98.7 F (37.1 C)   Resp 20   SpO2 100%       Physical Exam Vitals and nursing note reviewed.  Constitutional:      General: He is not in acute distress.    Appearance: Normal appearance. He is well-developed. He is not ill-appearing.  HENT:     Head: Normocephalic and atraumatic.     Nose: Congestion present.     Mouth/Throat:     Mouth: Mucous membranes are moist.     Pharynx: Oropharynx is clear. Posterior oropharyngeal erythema present.  Eyes:     General: No scleral icterus.    Conjunctiva/sclera: Conjunctivae normal.  Cardiovascular:     Rate and Rhythm: Normal rate and regular rhythm.     Heart sounds: Normal heart sounds.  Pulmonary:     Effort: Pulmonary effort is normal. No respiratory distress.     Breath sounds: Normal breath sounds.  Abdominal:      General: Abdomen is protuberant.     Palpations: Abdomen is soft.  Tenderness: There is abdominal tenderness in the right upper quadrant, left upper quadrant and left lower quadrant. There is no right CVA tenderness, left CVA tenderness, guarding or rebound.  Musculoskeletal:     Cervical back: Neck supple.  Lymphadenopathy:     Cervical: Cervical adenopathy present.  Skin:    General: Skin is warm and dry.     Capillary Refill: Capillary refill takes less than 2 seconds.  Neurological:     General: No focal deficit present.     Mental Status: He is alert. Mental status is at baseline.     Motor: No weakness.     Gait: Gait normal.  Psychiatric:        Mood and Affect: Mood normal.        Behavior: Behavior normal.      UC Treatments / Results  Labs (all labs ordered are listed, but only abnormal results are displayed) Labs Reviewed  COMPREHENSIVE METABOLIC PANEL - Abnormal; Notable for the following components:      Result Value   Sodium 133 (*)    Glucose, Bld 122 (*)    AST 59 (*)    Total Bilirubin 4.7 (*)    All other components within normal limits  LIPASE, BLOOD - Abnormal; Notable for the following components:   Lipase 58 (*)    All other components within normal limits  GROUP A STREP BY PCR  CBC WITH DIFFERENTIAL/PLATELET    EKG   Radiology No results found.  Procedures Procedures (including critical care time)  Medications Ordered in UC Medications - No data to display  Initial Impression / Assessment and Plan / UC Course  I have reviewed the triage vital signs and the nursing notes.  Pertinent labs & imaging results that were available during my care of the patient were reviewed by me and considered in my medical decision making (see chart for details).   52 year old male presents for abdominal cramping, nausea/vomiting x 1, diarrhea, swollen lymph node of the left side of his neck x 2 days.  Also reports cough and congestion which she says  is related to allergies.  No chest pain or breathing difficulty.  History of alcoholic liver cirrhosis.  Follows up with liver specialist and has an ultrasound scheduled for this Thursday.  BP elevated at 165/93.  He is afebrile and overall well-appearing.  On exam abdomen is protuberant and he is tenderness palpation of the right upper quadrant, left upper quadrant and left lower quadrant.  Appears to have most tenderness of the right upper quadrant.  Also has minimal nasal congestion and erythema posterior pharynx with swollen lymph node of the left anterior cervical region.  Chest clear to auscultation.  PCR strep test obtained.  Negative. CBC, CMP and lipase obtained.  CBC shows WBC count of 3.9, RBC count of 4.16.  Both are slightly decreased.  Platelet count was 52.  Platelet count 4 months ago was 59.  WBC count and RBC count about the same 4 months ago.  Lipase 58.  Lipase 4 months ago 61.  CMP shows slightly decreased sodium count of 135 and elevated glucose 122.  AST elevated 59 and bilirubin elevated 4.7.  No change in transaminases.  Bili is elevated from previous.  8 months ago was 2.5 and a year ago was 3.6.  Based on patient's lab results the only real change in his bilirubin has increased to 4.7 from 2.5 eight months ago.  It has been as high as 3.6 a  year ago.  Presentation is most consistent with viral gastroenteritis but patient's underlying cirrhosis is a complicating factor.  However, he has an appointment in 2 days to have an ultrasound of his liver performed and is to follow-up with liver specialist.  At this time encouraged increased rest and fluids.  Sent Bentyl for the abdominal cramping and Zofran for nausea.  Patient says his symptoms have improved quite a bit in the past couple of days.  He says he is only had diarrhea 1 time in the past 24 hours and no further vomiting.  Abdominal cramping is less bad either.  Advised patient to have low threshold to go to the ER if his  abdominal pain worsens or he develops a fever.  Patient is understanding and agreeable.  Advised to follow-up with specialist and PCP.   Final Clinical Impressions(s) / UC Diagnoses   Final diagnoses:  Viral gastroenteritis  Abdominal cramping  Nausea vomiting and diarrhea  Lymphadenopathy of left cervical region     Discharge Instructions      ABDOMINAL PAIN: You may take Tylenol for pain relief. Use medications as directed including antiemetics and antidiarrheal medications if suggested or prescribed. You should increase fluids and electrolytes as well as rest over these next several days. If you have any questions or concerns, or if your symptoms are not improving or if especially if they acutely worsen, please call or stop back to the clinic immediately and we will be happy to help you or go to the ER   ABDOMINAL PAIN RED FLAGS: Seek immediate further care if: symptoms remain the same or worsen over the next 3-7 days, you are unable to keep fluids down, you see blood or mucus in your stool, you vomit black or dark red material, you have a fever of 101.F or higher, you have localized and/or persistent abdominal pain       ED Prescriptions     Medication Sig Dispense Auth. Provider   dicyclomine (BENTYL) 20 MG tablet Take 1 tablet (20 mg total) by mouth 3 (three) times daily before meals for 5 days. 15 tablet Eusebio Friendly B, PA-C   ondansetron (ZOFRAN-ODT) 4 MG disintegrating tablet Take 1 tablet (4 mg total) by mouth every 8 (eight) hours as needed for nausea or vomiting. 10 tablet Gareth Morgan      PDMP not reviewed this encounter.   Shirlee Latch, PA-C 06/24/22 1058

## 2022-06-24 NOTE — ED Triage Notes (Signed)
Diarrhea started on Sunday and vomiting started last night x 1. Also felt pain and swelling in left lymph node that started last night and has returned this morning. Denies fever.

## 2022-06-24 NOTE — Discharge Instructions (Signed)
ABDOMINAL PAIN: You may take Tylenol for pain relief. Use medications as directed including antiemetics and antidiarrheal medications if suggested or prescribed. You should increase fluids and electrolytes as well as rest over these next several days. If you have any questions or concerns, or if your symptoms are not improving or if especially if they acutely worsen, please call or stop back to the clinic immediately and we will be happy to help you or go to the ER   ABDOMINAL PAIN RED FLAGS: Seek immediate further care if: symptoms remain the same or worsen over the next 3-7 days, you are unable to keep fluids down, you see blood or mucus in your stool, you vomit black or dark red material, you have a fever of 101.F or higher, you have localized and/or persistent abdominal pain   

## 2022-06-26 ENCOUNTER — Ambulatory Visit
Admission: RE | Admit: 2022-06-26 | Discharge: 2022-06-26 | Disposition: A | Discharge status: Home / Self Care | Payer: 59 | Source: Ambulatory Visit | Attending: Nurse Practitioner | Admitting: Nurse Practitioner

## 2022-06-26 DIAGNOSIS — K7030 Alcoholic cirrhosis of liver without ascites: Secondary | ICD-10-CM

## 2022-07-02 ENCOUNTER — Telehealth: Payer: Self-pay | Admitting: *Deleted

## 2022-07-02 ENCOUNTER — Telehealth: Payer: Self-pay | Admitting: Adult Health

## 2022-07-02 NOTE — Telephone Encounter (Signed)
Order for cpap titration being cancelled per TP request, thanks

## 2022-07-02 NOTE — Telephone Encounter (Signed)
I dont see I ordered a sleep study  Ordered a SD card from DME so we could do a download .

## 2022-07-02 NOTE — Telephone Encounter (Signed)
Called and spoke with patient, I verified that he gets his supplies online and is not established with a DME company.  He did get an SD card for his CPAP machine and it has been in his machine.  He states he has been slammed and has not had a chance to bring it by for [place] to get a download.  I asked that he bring it by when he gets a chance.  He verbalized understanding.  Nothing further needed.

## 2022-07-02 NOTE — Telephone Encounter (Signed)
Authorization for this patient's sleep study was denied due to not being medically necessity according to [facility]   P2P being requested at [phone] I can schedule P2P once okay'd by the provider.   Denied case #: [id number]   Thanks

## 2022-07-19 ENCOUNTER — Other Ambulatory Visit: Payer: Self-pay | Admitting: Family Medicine

## 2022-07-21 ENCOUNTER — Encounter: Payer: Self-pay | Admitting: Family Medicine

## 2022-08-01 NOTE — Telephone Encounter (Signed)
Can we check to see if got a SD card

## 2022-08-04 ENCOUNTER — Other Ambulatory Visit: Payer: Self-pay | Admitting: Family Medicine

## 2022-08-04 NOTE — Telephone Encounter (Signed)
Last office visit 01/29/2022 for insomnia and fatigue. Diflucan not on current medication list.  No future appointments.

## 2022-08-12 ENCOUNTER — Encounter: Payer: Self-pay | Admitting: *Deleted

## 2022-08-12 NOTE — Telephone Encounter (Signed)
Sent mychart message for an update from patient to see if he brought in his SD card.  Will await response.

## 2022-08-21 ENCOUNTER — Ambulatory Visit
Admission: EM | Admit: 2022-08-21 | Discharge: 2022-08-21 | Disposition: A | Discharge status: Home / Self Care | Payer: 59 | Attending: Emergency Medicine | Admitting: Emergency Medicine

## 2022-08-21 DIAGNOSIS — U071 COVID-19: Secondary | ICD-10-CM | POA: Diagnosis present

## 2022-08-21 LAB — CBC WITH DIFFERENTIAL/PLATELET
Abs Immature Granulocytes: 0.01 10*3/uL (ref 0.00–0.07)
Basophils Absolute: 0 10*3/uL (ref 0.0–0.1)
Basophils Relative: 1 %
Eosinophils Absolute: 0.1 10*3/uL (ref 0.0–0.5)
Eosinophils Relative: 2 %
HCT: 38.5 % — ABNORMAL LOW (ref 39.0–52.0)
Hemoglobin: 13.6 g/dL (ref 13.0–17.0)
Immature Granulocytes: 0 %
Lymphocytes Relative: 19 %
Lymphs Abs: 0.6 10*3/uL — ABNORMAL LOW (ref 0.7–4.0)
MCH: 34.9 pg — ABNORMAL HIGH (ref 26.0–34.0)
MCHC: 35.3 g/dL (ref 30.0–36.0)
MCV: 98.7 fL (ref 80.0–100.0)
Monocytes Absolute: 0.5 10*3/uL (ref 0.1–1.0)
Monocytes Relative: 15 %
Neutro Abs: 2 10*3/uL (ref 1.7–7.7)
Neutrophils Relative %: 63 %
Platelets: 56 10*3/uL — ABNORMAL LOW (ref 150–400)
RBC: 3.9 MIL/uL — ABNORMAL LOW (ref 4.22–5.81)
RDW: 13.4 % (ref 11.5–15.5)
WBC: 3.2 10*3/uL — ABNORMAL LOW (ref 4.0–10.5)
nRBC: 0 % (ref 0.0–0.2)

## 2022-08-21 LAB — SARS CORONAVIRUS 2 BY RT PCR: SARS Coronavirus 2 by RT PCR: POSITIVE — AB

## 2022-08-21 MED ORDER — NIRMATRELVIR/RITONAVIR (PAXLOVID)TABLET: 3.0000 | ORAL_TABLET | Freq: Two times a day (BID) | ORAL | 0 refills | Status: AC

## 2022-08-21 MED ORDER — PROMETHAZINE-DM 6.25-15 MG/5ML PO SYRP: 5.0000 mL | ORAL_SOLUTION | Freq: Four times a day (QID) | ORAL | 0 refills | Status: DC | PRN

## 2022-08-21 MED ORDER — BENZONATATE 100 MG PO CAPS: 200.0000 mg | ORAL_CAPSULE | Freq: Three times a day (TID) | ORAL | 0 refills | Status: DC

## 2022-08-21 MED ORDER — IPRATROPIUM BROMIDE 0.06 % NA SOLN: 2.0000 | Freq: Four times a day (QID) | NASAL | 12 refills | Status: DC

## 2022-08-21 NOTE — ED Triage Notes (Signed)
Pt c/o positive covid test x1day.  Pt states that symptoms started this morning.   Pt states that he tested positive an hour ago. Pt just returned from a cruise on Sunday with his family and they tested positive as well.  Pt asks for paxlovid.

## 2022-08-21 NOTE — ED Provider Notes (Signed)
[facility]    CSN: [id number] Arrival date & time: 08/21/22  1349      History   Chief Complaint Chief Complaint  Patient presents with   Cough    HPI Kyle Mercer is a 52 y.o. male.   HPI  52 year old male with past medical history significant for alcoholic cirrhosis of liver without ascites, GERD, hyperlipidemia, seasonal allergies, and thrombocytopenia presents requesting Paxlovid after testing COVID-positive this morning.  He recently returned from a cruise with 31 family members and 25 of those that were present have tested positive for COVID.  His symptoms began last night and they consist of fatigue, runny nose, nasal congestion, sore throat, and nosebleeds.  He states its mostly when he blows his nose.  He was having nosebleeds before the cruise, they resolved during the cruise, and they returned when [place] the [place].  Patient had recent blood work and May of this year which showed his platelet count was 52.  CMP was also collected at that time which showed mild elevation of AST but remainder of transaminases are unremarkable.  His GFR was greater than 60 at that time.  He denies fever, shortness breath, or wheezing.  Past Medical History:  Diagnosis Date   Alcoholic cirrhosis of liver without ascites (HCC) 08/18/2019   Anxiety    on meds   GERD (gastroesophageal reflux disease)    takes OTC meds PRN   History of nephrolithiasis    Hx of adenomatous and sessile serrated colonic polyps 06/11/2021   Hyperlipidemia    Kidney stones    Liver lesion    Seasonal allergies    Sleep apnea    uses CPAP   Substance abuse (HCC)    H/O ETOH   Thrombocytopenia (HCC) 02/03/2019    Patient Active Problem List   Diagnosis Date Noted   Morbid obesity (HCC) 03/03/2022   Hx of adenomatous and sessile serrated colonic polyps 06/11/2021   Alcoholic cirrhosis of liver without ascites (HCC) 08/18/2019   Liver lesion hepatic subsegment four? 02/03/2019    Thrombocytopenia (HCC) 02/03/2019   Sleep apnea 06/25/2009   Anxiety state 06/25/2008   Allergic rhinitis 06/25/2008   GERD 06/25/2008   NEPHROLITHIASIS, HX OF 06/25/2008    Past Surgical History:  Procedure Laterality Date   COLONOSCOPY     ESOPHAGOGASTRODUODENOSCOPY  10/2018   ROTATOR CUFF REPAIR Right    UPPER GASTROINTESTINAL ENDOSCOPY         Home Medications    Prior to Admission medications   Medication Sig Start Date End Date Taking? Authorizing Provider  benzonatate (TESSALON) 100 MG capsule Take 2 capsules (200 mg total) by mouth every 8 (eight) hours. 08/21/22  Yes Becky Augusta, NP  ipratropium (ATROVENT) 0.06 % nasal spray Place 2 sprays into both nostrils 4 (four) times daily. 08/21/22  Yes Becky Augusta, NP  nirmatrelvir/ritonavir (PAXLOVID) 20 x 150 MG & 10 x 100MG  TABS Take 3 tablets by mouth 2 (two) times daily for 5 days. 08/21/22 08/26/22 Yes Becky Augusta, NP  promethazine-dextromethorphan (PROMETHAZINE-DM) 6.25-15 MG/5ML syrup Take 5 mLs by mouth 4 (four) times daily as needed. 08/21/22  Yes Becky Augusta, NP  dicyclomine (BENTYL) 20 MG tablet Take 1 tablet (20 mg total) by mouth 3 (three) times daily before meals for 5 days. 06/24/22 06/29/22  Shirlee Latch, PA-C  famotidine (PEPCID) 20 MG tablet Take 20 mg by mouth daily as needed for heartburn or indigestion.    [provider]  fluconazole (  DIFLUCAN) 150 MG tablet TAKE 2 TAB BY MOUTH TODAY, REPEAT 2 TABS IN 7 DAYS 08/04/22   Copland, Karleen Hampshire, MD  fluticasone (FLONASE) 50 MCG/ACT nasal spray PLACE 2 SPRAYS INTO THE NOSE ONCE DAILY. 07/22/22   Copland, Karleen Hampshire, MD  ondansetron (ZOFRAN-ODT) 4 MG disintegrating tablet Take 1 tablet (4 mg total) by mouth every 8 (eight) hours as needed for nausea or vomiting. 06/24/22   Shirlee Latch, PA-C  sertraline (ZOLOFT) 50 MG tablet TAKE 1 TABLET BY MOUTH EVERY DAY 01/24/22   Copland, Karleen Hampshire, MD    Family History Family History  Problem Relation Age of Onset   Diabetes Mother     Heart disease Mother    Colon polyps Father    Diabetes Father    Heart disease Father    Diabetes Brother    Esophageal cancer Neg Hx    Pancreatic cancer Neg Hx    Stomach cancer Neg Hx    Liver disease Neg Hx    Colon cancer Neg Hx    Crohn's disease Neg Hx    Rectal cancer Neg Hx    Ulcerative colitis Neg Hx     Social History Social History   Tobacco Use   Smoking status: Former    Years: 7    Types: Cigarettes    Quit date: 02/18/2000    Years since quitting: 22.5    Passive exposure: Past   Smokeless tobacco: Never   Tobacco comments:    3-4 cigarettes per day at the most (recreational).  03/03/2021 hfb  Vaping Use   Vaping Use: Never used  Substance Use Topics   Alcohol use: Not Currently    Alcohol/week: 21.0 standard drinks of alcohol    Types: 21 Standard drinks or equivalent per week   Drug use: No     Allergies   Cefdinir   Review of Systems Review of Systems  Constitutional:  Negative for fever.  HENT:  Positive for congestion, nosebleeds and rhinorrhea. Negative for ear pain and sore throat.   Respiratory:  Positive for cough. Negative for shortness of breath and wheezing.      Physical Exam Triage Vital Signs ED Triage Vitals  Enc Vitals Group     BP      Pulse      Resp      Temp      Temp src      SpO2      Weight      Height      Head Circumference      Peak Flow      Pain Score      Pain Loc      Pain Edu?      Excl. in GC?    No data found.  Updated Vital Signs BP (!) 161/87 (BP Location: Left Arm)   Pulse (!) 104   Temp 99.2 F (37.3 C) (Oral)   Ht 5\' 10"  (1.778 m)   Wt 230 lb (104.3 kg)   SpO2 99%   BMI 33.00 kg/m   Visual Acuity Right Eye Distance:   Left Eye Distance:   Bilateral Distance:    Right Eye Near:   Left Eye Near:    Bilateral Near:     Physical Exam Vitals and nursing note reviewed.  Constitutional:      Appearance: Normal appearance. He is not ill-appearing.  HENT:     Head:  Normocephalic and atraumatic.     Right Ear: Tympanic membrane, ear canal  and external ear normal. There is no impacted cerumen.     Left Ear: Tympanic membrane, ear canal and external ear normal. There is no impacted cerumen.     Nose: Congestion present.     Comments: Patient has mild edema and erythema of the nasal mucosa bilaterally.  There is bloody residue present in the right nare but no active epistaxis at this time.    Mouth/Throat:     Mouth: Mucous membranes are moist.     Pharynx: Oropharynx is clear. No oropharyngeal exudate or posterior oropharyngeal erythema.  Cardiovascular:     Rate and Rhythm: Normal rate and regular rhythm.     Pulses: Normal pulses.     Heart sounds: Normal heart sounds. No murmur heard.    No friction rub. No gallop.  Pulmonary:     Effort: Pulmonary effort is normal.     Breath sounds: Normal breath sounds. No wheezing, rhonchi or rales.  Musculoskeletal:     Cervical back: Normal range of motion and neck supple.  Lymphadenopathy:     Cervical: No cervical adenopathy.  Skin:    General: Skin is warm and dry.     Capillary Refill: Capillary refill takes less than 2 seconds.     Findings: No erythema or rash.  Neurological:     General: No focal deficit present.     Mental Status: He is alert and oriented to person, place, and time.      UC Treatments / Results  Labs (all labs ordered are listed, but only abnormal results are displayed) Labs Reviewed  SARS CORONAVIRUS 2 BY RT PCR - Abnormal; Notable for the following components:      Result Value   SARS Coronavirus 2 by RT PCR POSITIVE (*)    All other components within normal limits  CBC WITH DIFFERENTIAL/PLATELET - Abnormal; Notable for the following components:   WBC 3.2 (*)    RBC 3.90 (*)    HCT 38.5 (*)    MCH 34.9 (*)    Platelets 56 (*)    Lymphs Abs 0.6 (*)    All other components within normal limits    EKG   Radiology No results found.  Procedures Procedures  (including critical care time)  Medications Ordered in UC Medications - No data to display  Initial Impression / Assessment and Plan / UC Course  I have reviewed the triage vital signs and the nursing notes.  Pertinent labs & imaging results that were available during my care of the patient were reviewed by me and considered in my medical decision making (see chart for details).   Patient is a nontoxic-appearing 32 male presenting requesting Paxlovid after testing COVID-positive as outlined HPI above.  He does have a history of thrombocytopenia as well and has been experiencing nosebleeds.  He does not have an active nosebleed at this time and he denies any pouring of blood from his nose it is more of a slight trickle and blood be present when he blows his nose.  Recent blood work from May showed a platelet count of 52.  Given that he is COVID-positive there is concern for worsening thrombocytopenia so I will check a CBC to evaluate his platelet count.  I will also order a COVID PCR as confirmatory testing.  If his thrombocytopenia has markedly increased I will refer him to the emergency department.  If his platelet count is similar to that his count and may I will start him on Paxlovid.  COVID  PCR is positive.  CBC shows a platelet count of 56, which is an improvement over 54 from May.  I will discharge patient on the diagnosis of COVID-19 and start him on Paxlovid twice daily for 5 days.  Also Atrovent nasal spray to help with the nasal congestion, Tessalon Perles and Promethazine DM cough syrup to with cough and congestion.  Final Clinical Impressions(s) / UC Diagnoses   Final diagnoses:  COVID-19     Discharge Instructions      You have tested positive for COVID-19.  The new CDC guidelines state you do not have to quarantine due to being COVID-positive but you do need to wear a mask around others for the first 5 days of your symptoms.  If you develop a fever then you need to  quarantine until you have it had a fever for 24 hours without taking over-the-counter medication to control your fever.  Use over-the-counter Tylenol and or ibuprofen according to package instructions as needed for fever and bodyaches.  Take the Paxlovid twice daily for 5 days for treatment of COVID-19.  Use the Atrovent nasal spray, 2 squirts up each nostril every 6 hours, as needed for nasal congestion and drainage.  Use the Tessalon Perles every 8 hours during the day as needed for cough and use the Promethazine DM cough syrup at bedtime as needed for cough and congestion.  If you develop any shortness of breath, especially at rest, feel as though you cannot catch your breath, you are unable to speak in full sentences, or is a late sign your lip start turning blue you need to call 911 and go to the ER.     ED Prescriptions     Medication Sig Dispense Auth. Provider   benzonatate (TESSALON) 100 MG capsule Take 2 capsules (200 mg total) by mouth every 8 (eight) hours. 21 capsule Becky Augusta, NP   ipratropium (ATROVENT) 0.06 % nasal spray Place 2 sprays into both nostrils 4 (four) times daily. 15 mL Becky Augusta, NP   nirmatrelvir/ritonavir (PAXLOVID) 20 x 150 MG & 10 x 100MG  TABS Take 3 tablets by mouth 2 (two) times daily for 5 days. 30 tablet Becky Augusta, NP   promethazine-dextromethorphan (PROMETHAZINE-DM) 6.25-15 MG/5ML syrup Take 5 mLs by mouth 4 (four) times daily as needed. 118 mL Becky Augusta, NP      PDMP not reviewed this encounter.   Becky Augusta, NP 08/21/22 1459

## 2022-08-21 NOTE — Discharge Instructions (Addendum)
You have tested positive for COVID-19.  The new CDC guidelines state you do not have to quarantine due to being COVID-positive but you do need to wear a mask around others for the first 5 days of your symptoms.  If you develop a fever then you need to quarantine until you have it had a fever for 24 hours without taking over-the-counter medication to control your fever.  Use over-the-counter Tylenol and or ibuprofen according to package instructions as needed for fever and bodyaches.  Take the Paxlovid twice daily for 5 days for treatment of COVID-19.  Use the Atrovent nasal spray, 2 squirts up each nostril every 6 hours, as needed for nasal congestion and drainage.  Use the Tessalon Perles every 8 hours during the day as needed for cough and use the Promethazine DM cough syrup at bedtime as needed for cough and congestion.  If you develop any shortness of breath, especially at rest, feel as though you cannot catch your breath, you are unable to speak in full sentences, or is a late sign your lip start turning blue you need to call 911 and go to the ER.

## 2022-09-24 NOTE — Telephone Encounter (Signed)
Mychart message sent again to patient requesting SD card to be brought in for [place] to download information for review.

## 2022-10-20 ENCOUNTER — Other Ambulatory Visit: Payer: Self-pay | Admitting: Family Medicine

## 2022-10-20 DIAGNOSIS — Z131 Encounter for screening for diabetes mellitus: Secondary | ICD-10-CM

## 2022-10-20 DIAGNOSIS — K7030 Alcoholic cirrhosis of liver without ascites: Secondary | ICD-10-CM

## 2022-10-20 DIAGNOSIS — Z79899 Other long term (current) drug therapy: Secondary | ICD-10-CM

## 2022-10-20 DIAGNOSIS — E559 Vitamin D deficiency, unspecified: Secondary | ICD-10-CM

## 2022-10-20 DIAGNOSIS — Z125 Encounter for screening for malignant neoplasm of prostate: Secondary | ICD-10-CM

## 2022-10-20 DIAGNOSIS — Z13220 Encounter for screening for lipoid disorders: Secondary | ICD-10-CM

## 2022-10-27 ENCOUNTER — Other Ambulatory Visit (INDEPENDENT_AMBULATORY_CARE_PROVIDER_SITE_OTHER): Payer: 59

## 2022-10-27 DIAGNOSIS — Z79899 Other long term (current) drug therapy: Secondary | ICD-10-CM

## 2022-10-27 DIAGNOSIS — Z13220 Encounter for screening for lipoid disorders: Secondary | ICD-10-CM | POA: Diagnosis not present

## 2022-10-27 DIAGNOSIS — Z131 Encounter for screening for diabetes mellitus: Secondary | ICD-10-CM | POA: Diagnosis not present

## 2022-10-27 DIAGNOSIS — E559 Vitamin D deficiency, unspecified: Secondary | ICD-10-CM | POA: Diagnosis not present

## 2022-10-27 DIAGNOSIS — Z125 Encounter for screening for malignant neoplasm of prostate: Secondary | ICD-10-CM

## 2022-10-27 DIAGNOSIS — K7030 Alcoholic cirrhosis of liver without ascites: Secondary | ICD-10-CM | POA: Diagnosis not present

## 2022-10-27 LAB — CBC WITH DIFFERENTIAL/PLATELET
Basophils Absolute: 0 10*3/uL (ref 0.0–0.1)
Basophils Relative: 1.2 % (ref 0.0–3.0)
Eosinophils Absolute: 0.1 10*3/uL (ref 0.0–0.7)
Eosinophils Relative: 3 % (ref 0.0–5.0)
HCT: 42 % (ref 39.0–52.0)
Hemoglobin: 14.1 g/dL (ref 13.0–17.0)
Lymphocytes Relative: 36.8 % (ref 12.0–46.0)
Lymphs Abs: 1 10*3/uL (ref 0.7–4.0)
MCHC: 33.6 g/dL (ref 30.0–36.0)
MCV: 102.2 fl — ABNORMAL HIGH (ref 78.0–100.0)
Monocytes Absolute: 0.3 10*3/uL (ref 0.1–1.0)
Monocytes Relative: 9.7 % (ref 3.0–12.0)
Neutro Abs: 1.4 10*3/uL (ref 1.4–7.7)
Neutrophils Relative %: 49.3 % (ref 43.0–77.0)
Platelets: 55 10*3/uL — ABNORMAL LOW (ref 150.0–400.0)
RBC: 4.11 Mil/uL — ABNORMAL LOW (ref 4.22–5.81)
RDW: 14.3 % (ref 11.5–15.5)
WBC: 2.7 10*3/uL — ABNORMAL LOW (ref 4.0–10.5)

## 2022-10-27 LAB — LIPID PANEL
Cholesterol: 224 mg/dL — ABNORMAL HIGH (ref 0–200)
HDL: 57.9 mg/dL (ref >39.00)
LDL Cholesterol: 142 mg/dL — ABNORMAL HIGH (ref 0–99)
NonHDL: 165.6
Total CHOL/HDL Ratio: 4
Triglycerides: 117 mg/dL (ref 0.0–149.0)
VLDL: 23.4 mg/dL (ref 0.0–40.0)

## 2022-10-27 LAB — HEPATIC FUNCTION PANEL
ALT: 23 U/L (ref 0–53)
AST: 56 U/L — ABNORMAL HIGH (ref 0–37)
Albumin: 3.4 g/dL — ABNORMAL LOW (ref 3.5–5.2)
Alkaline Phosphatase: 101 U/L (ref 39–117)
Bilirubin, Direct: 0.6 mg/dL — ABNORMAL HIGH (ref 0.0–0.3)
Total Bilirubin: 2.3 mg/dL — ABNORMAL HIGH (ref 0.2–1.2)
Total Protein: 6.8 g/dL (ref 6.0–8.3)

## 2022-10-27 LAB — BASIC METABOLIC PANEL
BUN: 6 mg/dL (ref 6–23)
CO2: 31 meq/L (ref 19–32)
Calcium: 8.5 mg/dL (ref 8.4–10.5)
Chloride: 104 meq/L (ref 96–112)
Creatinine, Ser: 0.77 mg/dL (ref 0.40–1.50)
GFR: 103 mL/min (ref >60.00)
Glucose, Bld: 120 mg/dL — ABNORMAL HIGH (ref 70–99)
Potassium: 4.1 meq/L (ref 3.5–5.1)
Sodium: 140 meq/L (ref 135–145)

## 2022-10-27 LAB — VITAMIN D 25 HYDROXY (VIT D DEFICIENCY, FRACTURES): VITD: 39.31 ng/mL (ref 30.00–100.00)

## 2022-10-27 LAB — HEMOGLOBIN A1C: Hgb A1c MFr Bld: 4.7 % (ref 4.6–6.5)

## 2022-10-29 LAB — PSA, TOTAL WITH REFLEX TO PSA, FREE: PSA, Total: 0.3 ng/mL (ref <=4.0)

## 2022-11-01 NOTE — Progress Notes (Unsigned)
Kyle Purohit T. Cerena Baine, MD, CAQ [facility] [address] [place], [address]  Phone: [phone]  FAX: [phone]  Kyle Mercer - 52 y.o. male  MRN [id number]  Date of Birth: Aug 23, 1970  Date: 11/03/2022  PCP: Hannah Beat, MD  Referral: Hannah Beat, MD  No chief complaint on file.  Patient Care Team: Hannah Beat, MD as PCP - General Subjective:   Kyle Mercer is a 52 y.o. pleasant patient who presents with the following:  Preventative Health Maintenance Visit:  Health Maintenance Summary Reviewed and updated, unless pt declines services.  Tobacco History Reviewed. Alcohol: No concerns, no excessive use Exercise Habits: Some activity, rec at least 30 mins 5 times a week STD concerns: no risk or activity to increase risk Drug Use: None  Other ongoing medical issues include cirrhosis.  Influenza vaccine COVID-vaccine  Health Maintenance  Topic Date Due   INFLUENZA VACCINE  09/18/2022   COVID-19 Vaccine (6 - 2023-24 season) 10/19/2022   DTaP/Tdap/Td (2 - Td or Tdap) 11/23/2022   Colonoscopy  06/01/2026   Hepatitis C Screening  Completed   HIV Screening  Completed   Zoster Vaccines- Shingrix  Completed   HPV VACCINES  Aged Out   Immunization History  Administered Date(s) Administered   COVID-19, mRNA, vaccine(Comirnaty)12 years and older 11/25/2021   Hep A / Hep B 02/23/2019, 03/28/2019   Influenza Inj Mdck Quad Pf 01/02/2020   Influenza,inj,Quad PF,6+ Mos 11/22/2012, 11/10/2016, 11/11/2017, 10/28/2021   Influenza,inj,quad, With Preservative 03/05/2015   Influenza-Unspecified 11/16/2018   PFIZER(Purple Top)SARS-COV-2 Vaccination 05/08/2019, 05/29/2019, 12/03/2019, 06/30/2020   Tdap 11/22/2012   Zoster Recombinant(Shingrix) 09/10/2020, 11/13/2020   Patient Active Problem List   Diagnosis Date Noted   Alcoholic cirrhosis of liver without ascites (HCC) 08/18/2019    Priority: High    Liver lesion hepatic subsegment four? 02/03/2019    Priority: Medium    Thrombocytopenia (HCC) 02/03/2019    Priority: Medium    Sleep apnea 06/25/2009    Priority: Low   Allergic rhinitis 06/25/2008    Priority: Low   GERD 06/25/2008    Priority: Low   NEPHROLITHIASIS, HX OF 06/25/2008    Priority: Low   Morbid obesity (HCC) 03/03/2022   Hx of adenomatous and sessile serrated colonic polyps 06/11/2021   Anxiety state 06/25/2008    Past Medical History:  Diagnosis Date   Alcoholic cirrhosis of liver without ascites (HCC) 08/18/2019   Anxiety    on meds   GERD (gastroesophageal reflux disease)    takes OTC meds PRN   History of nephrolithiasis    Hx of adenomatous and sessile serrated colonic polyps 06/11/2021   Hyperlipidemia    Kidney stones    Liver lesion    Seasonal allergies    Sleep apnea    uses CPAP   Substance abuse (HCC)    H/O ETOH   Thrombocytopenia (HCC) 02/03/2019    Past Surgical History:  Procedure Laterality Date   COLONOSCOPY     ESOPHAGOGASTRODUODENOSCOPY  10/2018   ROTATOR CUFF REPAIR Right    UPPER GASTROINTESTINAL ENDOSCOPY      Family History  Problem Relation Age of Onset   Diabetes Mother    Heart disease Mother    Colon polyps Father    Diabetes Father    Heart disease Father    Diabetes Brother    Esophageal cancer Neg Hx    Pancreatic cancer Neg Hx    Stomach cancer Neg Hx  Liver disease Neg Hx    Colon cancer Neg Hx    Crohn's disease Neg Hx    Rectal cancer Neg Hx    Ulcerative colitis Neg Hx     Social History   Social History Narrative   Married, 1 biologic son born 33, 1 daughter by marriage born 1982 when he has grandchildren    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  No drug use.  Update May 2023-"wine only on the weekends"   regular exercise- no       Quit alcohol August 2023    Past Medical History, Surgical History, Social History, Family History, Problem List, Medications, and Allergies have been reviewed and updated if relevant.  Review of Systems: Pertinent positives are listed above.  Otherwise, a full 14 point review of systems has been done in full and it is negative except where it is noted positive.  Objective:   There were no vitals taken for this visit. Ideal Body Weight:    Ideal Body Weight:   No results found.    10/28/2021    2:09 PM 08/17/2019    2:40 PM 11/11/2017    8:56 AM 11/10/2016    3:21 PM  Depression screen PHQ 2/9  Decreased Interest 0 0 0 0  Down, Depressed, Hopeless 0 0 0 0  PHQ - 2 Score 0 0 0 0     GEN: well developed, well nourished, no acute distress Eyes: conjunctiva and lids normal, PERRLA, EOMI ENT: TM clear, nares clear, oral exam WNL Neck: supple, no lymphadenopathy, no thyromegaly, no JVD Pulm: clear to auscultation and percussion, respiratory effort normal CV: regular rate and rhythm, S1-S2, no murmur, rub or gallop, no bruits, peripheral pulses normal and symmetric, no cyanosis, clubbing, edema or varicosities GI: soft, non-tender; no hepatosplenomegaly, masses; active bowel sounds all quadrants GU: deferred Lymph: no cervical, axillary or inguinal adenopathy MSK: gait normal, muscle tone and strength WNL, no joint swelling, effusions, discoloration, crepitus  SKIN: clear, good turgor, color WNL, no rashes, lesions, or ulcerations Neuro: normal mental status, normal strength, sensation, and motion Psych: alert; oriented to person, place and time, normally interactive and not anxious or depressed in appearance.  All labs reviewed with patient. Results for orders placed or performed in visit on 10/27/22  VITAMIN D 25 Hydroxy (Vit-D Deficiency, Fractures)  Result Value Ref Range   VITD 39.31 30.00 - 100.00 ng/mL  PSA, Total with Reflex to PSA, Free  Result Value Ref Range   PSA,  Total 0.3 < OR = 4.0 ng/mL  Lipid panel  Result Value Ref Range   Cholesterol 224 (H) 0 - 200 mg/dL   Triglycerides 956.2 0.0 - 149.0 mg/dL   HDL 13.08 >65.78 mg/dL   VLDL 46.9 0.0 - 62.9 mg/dL   LDL Cholesterol 528 (H) 0 - 99 mg/dL   Total CHOL/HDL Ratio 4    NonHDL 165.60   Hemoglobin A1c  Result Value Ref Range   Hgb A1c MFr Bld 4.7 4.6 - 6.5 %  Hepatic function panel  Result Value Ref Range   Total Bilirubin 2.3 (H) 0.2 - 1.2 mg/dL   Bilirubin, Direct 0.6 (H) 0.0 - 0.3 mg/dL   Alkaline Phosphatase 101 39 - 117 U/L   AST 56 (H) 0 - 37 U/L   ALT 23 0 -  53 U/L   Total Protein 6.8 6.0 - 8.3 g/dL   Albumin 3.4 (L) 3.5 - 5.2 g/dL  CBC with Differential/Platelet  Result Value Ref Range   WBC 2.7 (L) 4.0 - 10.5 K/uL   RBC 4.11 (L) 4.22 - 5.81 Mil/uL   Hemoglobin 14.1 13.0 - 17.0 g/dL   HCT 16.1 09.6 - 04.5 %   MCV 102.2 (H) 78.0 - 100.0 fl   MCHC 33.6 30.0 - 36.0 g/dL   RDW 40.9 81.1 - 91.4 %   Platelets 55.0 (L) 150.0 - 400.0 K/uL   Neutrophils Relative % 49.3 43.0 - 77.0 %   Lymphocytes Relative 36.8 12.0 - 46.0 %   Monocytes Relative 9.7 3.0 - 12.0 %   Eosinophils Relative 3.0 0.0 - 5.0 %   Basophils Relative 1.2 0.0 - 3.0 %   Neutro Abs 1.4 1.4 - 7.7 K/uL   Lymphs Abs 1.0 0.7 - 4.0 K/uL   Monocytes Absolute 0.3 0.1 - 1.0 K/uL   Eosinophils Absolute 0.1 0.0 - 0.7 K/uL   Basophils Absolute 0.0 0.0 - 0.1 K/uL  Basic metabolic panel  Result Value Ref Range   Sodium 140 135 - 145 mEq/L   Potassium 4.1 3.5 - 5.1 mEq/L   Chloride 104 96 - 112 mEq/L   CO2 31 19 - 32 mEq/L   Glucose, Bld 120 (H) 70 - 99 mg/dL   BUN 6 6 - 23 mg/dL   Creatinine, Ser 7.82 0.40 - 1.50 mg/dL   GFR 956.21 >30.86 mL/min   Calcium 8.5 8.4 - 10.5 mg/dL    Assessment and Plan:     ICD-10-CM   1. Healthcare maintenance  Z00.00       Health Maintenance Exam: The patient's preventative maintenance and recommended screening tests for an annual wellness exam were reviewed in full today. Brought  up to date unless services declined.  Counselled on the importance of diet, exercise, and its role in overall health and mortality. The patient's FH and SH was reviewed, including their home life, tobacco status, and drug and alcohol status.  Follow-up in 1 year for physical exam or additional follow-up below.  Disposition: No follow-ups on file.  No orders of the defined types were placed in this encounter.  Medications Discontinued During This Encounter  Medication Reason   fluconazole (DIFLUCAN) 150 MG tablet    No orders of the defined types were placed in this encounter.   Signed,  Elpidio Galea. Jakirah Zaun, MD   Allergies as of 11/03/2022       Reactions   Cefdinir    REACTION: cramping in stomach, vomiting and diarrhea        Medication List        Accurate as of November 01, 2022  9:36 AM. If you have any questions, ask your nurse or doctor.          STOP taking these medications    fluconazole 150 MG tablet Commonly known as: DIFLUCAN       TAKE these medications    benzonatate 100 MG capsule Commonly known as: TESSALON Take 2 capsules (200 mg total) by mouth every 8 (eight) hours.   dicyclomine 20 MG tablet Commonly known as: BENTYL Take 1 tablet (20 mg total) by mouth 3 (three) times daily before meals for 5 days.   famotidine 20 MG tablet Commonly known as: PEPCID Take 20 mg by mouth daily as needed for heartburn or indigestion.   fluticasone 50 MCG/ACT nasal spray Commonly known  as: FLONASE PLACE 2 SPRAYS INTO THE NOSE ONCE DAILY.   ipratropium 0.06 % nasal spray Commonly known as: ATROVENT Place 2 sprays into both nostrils 4 (four) times daily.   ondansetron 4 MG disintegrating tablet Commonly known as: ZOFRAN-ODT Take 1 tablet (4 mg total) by mouth every 8 (eight) hours as needed for nausea or vomiting.   promethazine-dextromethorphan 6.25-15 MG/5ML syrup Commonly known as: PROMETHAZINE-DM Take 5 mLs by mouth 4 (four) times daily  as needed.   sertraline 50 MG tablet Commonly known as: ZOLOFT TAKE 1 TABLET BY MOUTH EVERY DAY

## 2022-11-02 ENCOUNTER — Other Ambulatory Visit: Payer: Self-pay | Admitting: Family Medicine

## 2022-11-03 ENCOUNTER — Encounter: Payer: Self-pay | Admitting: Family Medicine

## 2022-11-03 ENCOUNTER — Ambulatory Visit (INDEPENDENT_AMBULATORY_CARE_PROVIDER_SITE_OTHER): Payer: 59 | Admitting: Family Medicine

## 2022-11-03 ENCOUNTER — Telehealth: Payer: Self-pay | Admitting: Adult Health

## 2022-11-03 VITALS — BP 130/70 | HR 91 | Temp 99.1°F | Ht 69.75 in | Wt 236.0 lb

## 2022-11-03 DIAGNOSIS — Z0000 Encounter for general adult medical examination without abnormal findings: Secondary | ICD-10-CM | POA: Diagnosis not present

## 2022-11-03 DIAGNOSIS — Z23 Encounter for immunization: Secondary | ICD-10-CM | POA: Diagnosis not present

## 2022-11-03 NOTE — Telephone Encounter (Signed)
New message   The patient at the front desk drop off [facility] Card for NP.   Card place in NP box.

## 2022-11-07 NOTE — Telephone Encounter (Signed)
Routing to [employer] as an Financial planner.

## 2022-12-19 ENCOUNTER — Other Ambulatory Visit: Payer: Self-pay | Admitting: Nurse Practitioner

## 2022-12-19 ENCOUNTER — Encounter: Payer: Self-pay | Admitting: Nurse Practitioner

## 2022-12-19 DIAGNOSIS — K766 Portal hypertension: Secondary | ICD-10-CM

## 2022-12-19 DIAGNOSIS — K7030 Alcoholic cirrhosis of liver without ascites: Secondary | ICD-10-CM

## 2022-12-19 DIAGNOSIS — K3189 Other diseases of stomach and duodenum: Secondary | ICD-10-CM

## 2022-12-29 ENCOUNTER — Encounter: Payer: Self-pay | Admitting: Internal Medicine

## 2023-02-03 ENCOUNTER — Ambulatory Visit
Admission: RE | Admit: 2023-02-03 | Discharge: 2023-02-03 | Disposition: A | Discharge status: Home / Self Care | Payer: 59 | Source: Ambulatory Visit | Attending: Nurse Practitioner

## 2023-02-03 DIAGNOSIS — K766 Portal hypertension: Secondary | ICD-10-CM

## 2023-02-03 DIAGNOSIS — K3189 Other diseases of stomach and duodenum: Secondary | ICD-10-CM

## 2023-02-03 DIAGNOSIS — K7030 Alcoholic cirrhosis of liver without ascites: Secondary | ICD-10-CM

## 2023-06-24 ENCOUNTER — Other Ambulatory Visit: Payer: Self-pay | Admitting: Nurse Practitioner

## 2023-06-24 DIAGNOSIS — K319 Disease of stomach and duodenum, unspecified: Secondary | ICD-10-CM

## 2023-06-24 DIAGNOSIS — K7030 Alcoholic cirrhosis of liver without ascites: Secondary | ICD-10-CM

## 2023-06-24 DIAGNOSIS — K766 Portal hypertension: Secondary | ICD-10-CM

## 2023-07-03 ENCOUNTER — Ambulatory Visit
Admission: RE | Admit: 2023-07-03 | Discharge: 2023-07-03 | Disposition: A | Discharge status: Home / Self Care | Source: Ambulatory Visit | Attending: Nurse Practitioner | Admitting: Nurse Practitioner

## 2023-07-03 DIAGNOSIS — K766 Portal hypertension: Secondary | ICD-10-CM | POA: Diagnosis present

## 2023-07-03 DIAGNOSIS — K319 Disease of stomach and duodenum, unspecified: Secondary | ICD-10-CM

## 2023-07-03 DIAGNOSIS — K7030 Alcoholic cirrhosis of liver without ascites: Secondary | ICD-10-CM

## 2023-08-17 IMAGING — US US ABDOMEN COMPLETE
1 series · 15 of 25 positions shown · non-contrast
Comparison: MR abdomen 04/27/2019

CLINICAL DATA: History of cirrhosis.

EXAM:
ABDOMEN ULTRASOUND COMPLETE

[Series 1: us abdomen complete mc & wl · 15 of 90 slices shown]
[im 1/90]
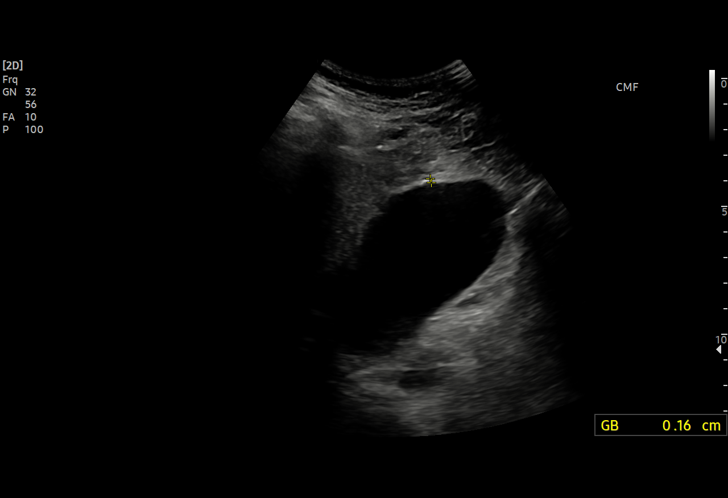
[im 8/90]
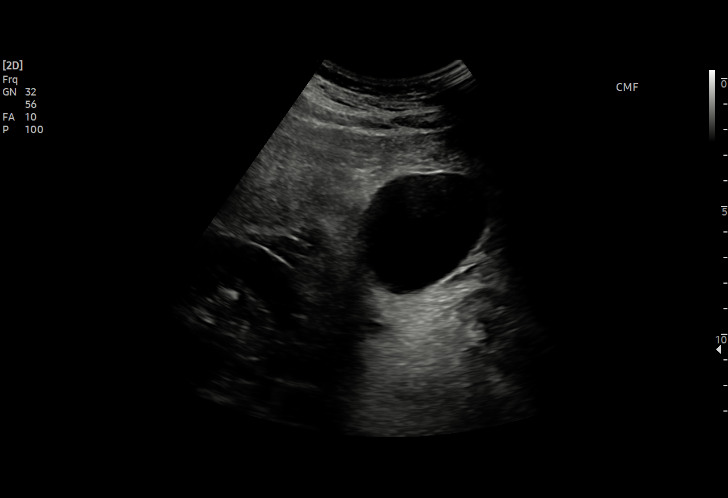
[im 15/90]
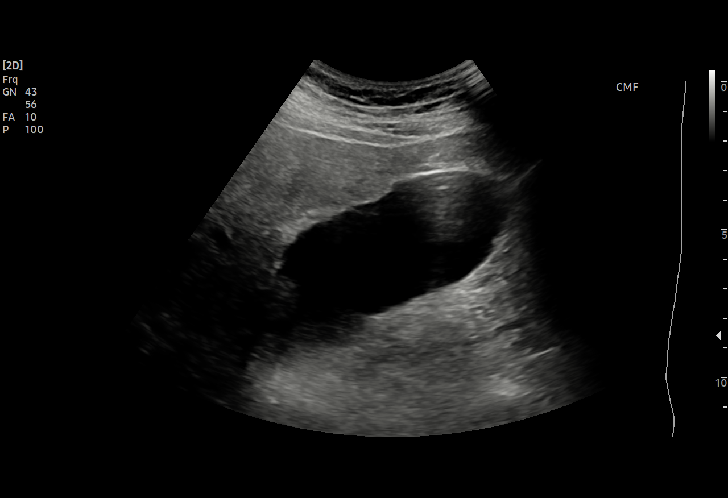
[im 19/90]
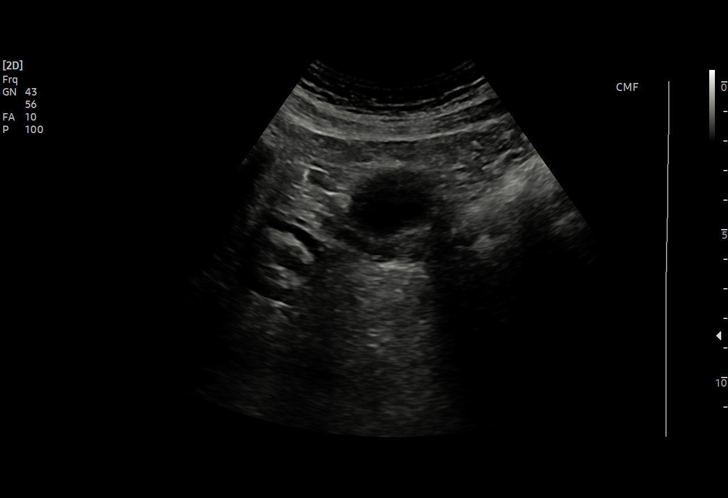
[im 26/90]
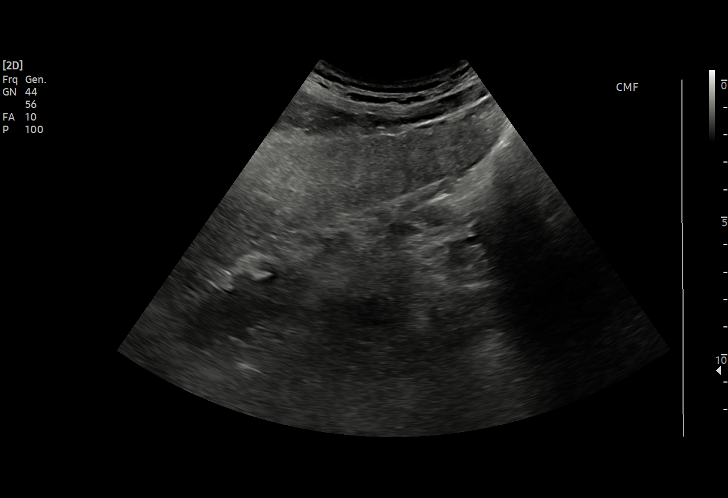
[im 34/90]
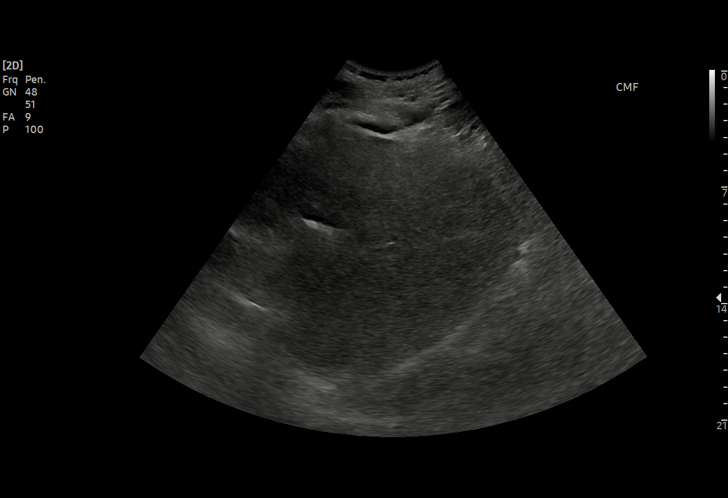
[im 38/90]
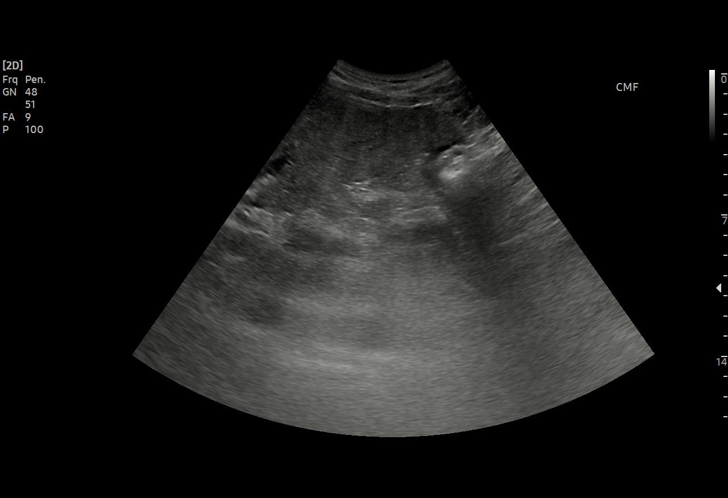
[im 45/90]
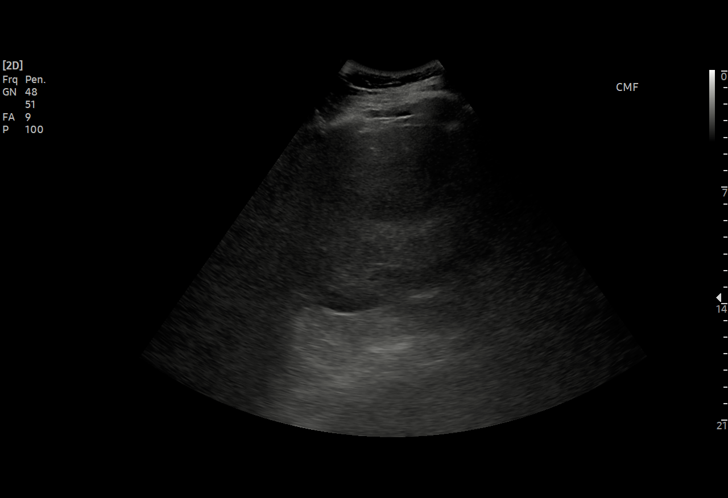
[im 52/90]
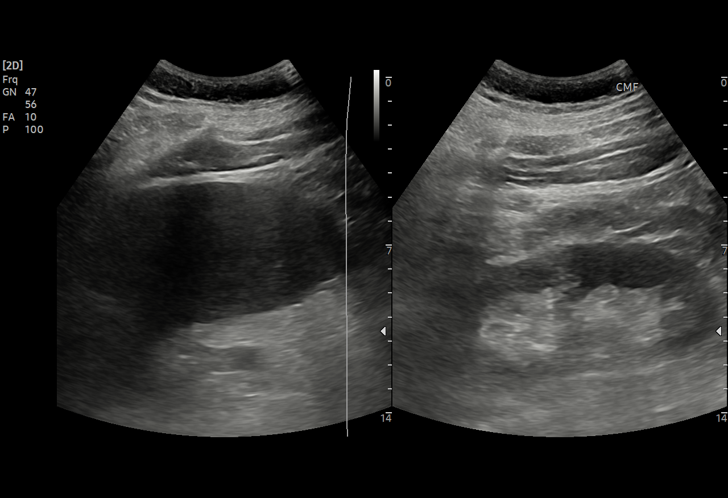
[im 56/90]
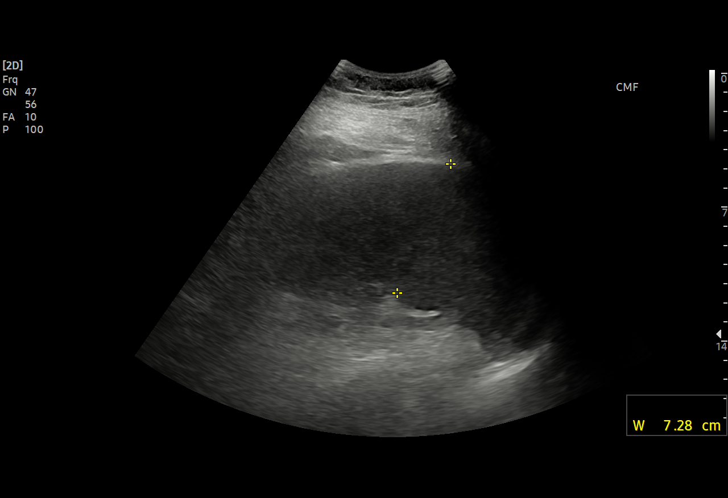
[im 64/90]
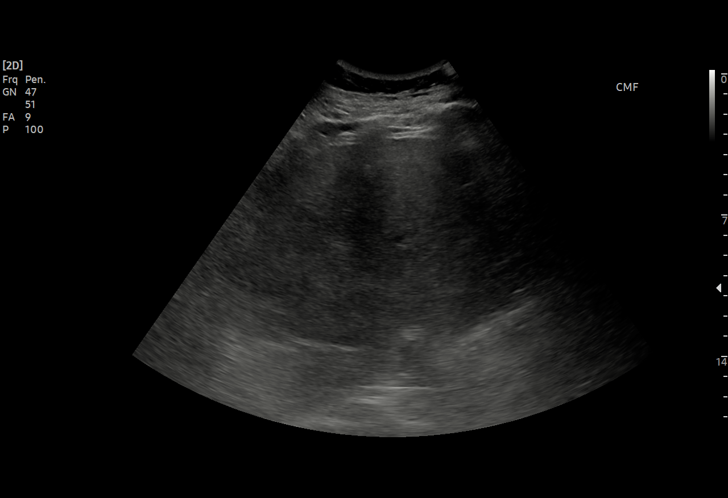
[im 71/90]
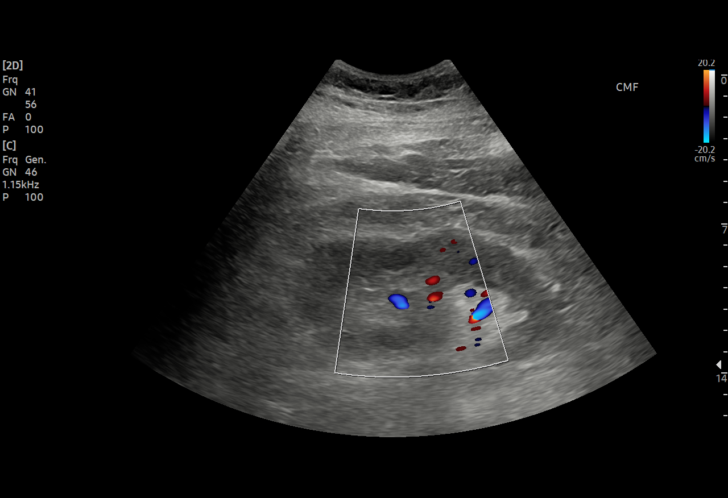
[im 75/90]
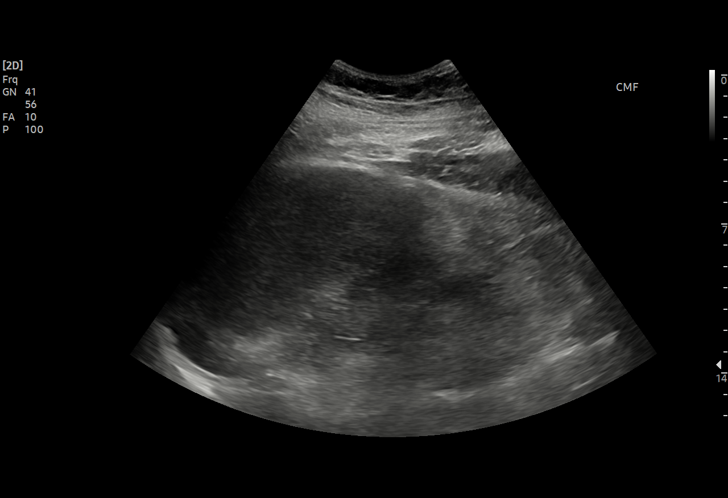
[im 82/90]
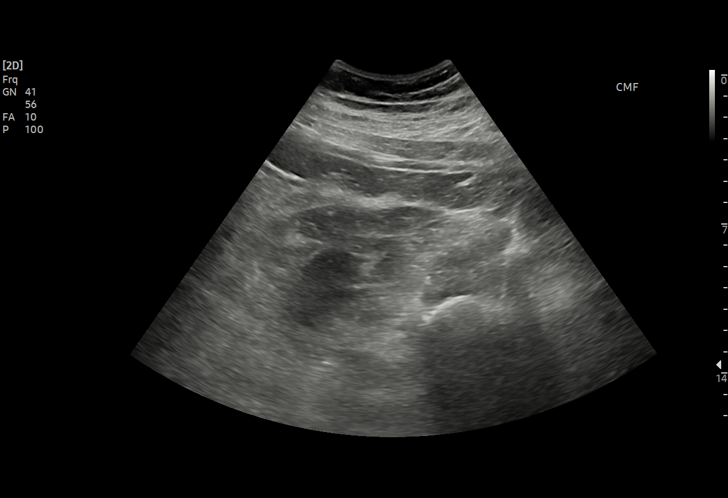
[im 90/90]
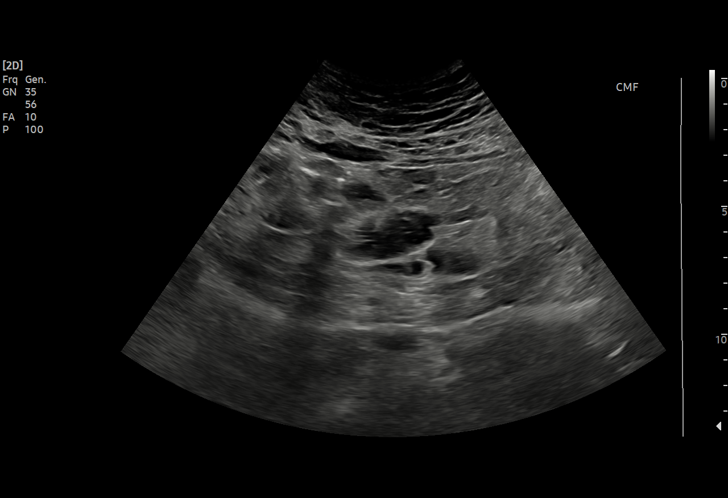

[15 of 25 positions shown; findings below may reference images not displayed]

FINDINGS: Gallbladder: No gallstones or wall thickening visualized. No
sonographic Murphy sign noted by sonographer.

Common bile duct: Diameter: 3 mm

Liver: Increased in echogenicity. No focal lesion. Portal vein is
patent on color Doppler imaging with normal direction of blood flow
towards the liver.

IVC: No abnormality visualized.

Pancreas: Poorly visualized.

Spleen: 17.4 cm, enlarged.

Right Kidney: Length: 13 cm. Echogenicity within normal limits. No
mass or hydronephrosis visualized.

Left Kidney: Length: 12.7 cm. Echogenicity within normal limits. No
mass or hydronephrosis visualized.

Abdominal aorta: No aneurysm visualized.

Other findings: None.
IMPRESSION: Increased hepatic parenchymal echogenicity suggestive of steatosis.

Splenomegaly.

## 2023-09-05 ENCOUNTER — Other Ambulatory Visit: Payer: Self-pay | Admitting: Family Medicine

## 2023-10-13 ENCOUNTER — Encounter: Payer: Self-pay | Admitting: Family Medicine

## 2023-10-14 ENCOUNTER — Other Ambulatory Visit: Payer: Self-pay | Admitting: Family Medicine

## 2023-10-14 DIAGNOSIS — Z131 Encounter for screening for diabetes mellitus: Secondary | ICD-10-CM

## 2023-10-14 DIAGNOSIS — R5383 Other fatigue: Secondary | ICD-10-CM

## 2023-10-14 DIAGNOSIS — Z125 Encounter for screening for malignant neoplasm of prostate: Secondary | ICD-10-CM

## 2023-10-14 DIAGNOSIS — K7030 Alcoholic cirrhosis of liver without ascites: Secondary | ICD-10-CM

## 2023-10-14 DIAGNOSIS — Z79899 Other long term (current) drug therapy: Secondary | ICD-10-CM

## 2023-10-14 DIAGNOSIS — E559 Vitamin D deficiency, unspecified: Secondary | ICD-10-CM

## 2023-10-14 DIAGNOSIS — Z13220 Encounter for screening for lipoid disorders: Secondary | ICD-10-CM

## 2023-10-30 LAB — LAB REPORT - SCANNED
EGFR: 111
HM Hepatitis Screen: NEGATIVE

## 2023-11-01 ENCOUNTER — Encounter: Payer: Self-pay | Admitting: Family Medicine

## 2023-11-01 NOTE — Progress Notes (Signed)
 Kyle Swingle T. Trever Streater, MD, CAQ Sports Medicine [facility] at [facility] [address] [place], [address]  Phone: [phone]  FAX: [phone]  Kyle Mercer - 53 y.o. male  MRN [id number]  Date of Birth: 1970-04-22  Date: 11/04/2023  PCP: Watt Mirza, MD  Referral: Watt Mirza, MD  No chief complaint on file.  Patient Care Team: Watt Mirza, MD as PCP - General Subjective:   Kyle Mercer is a 53 y.o. pleasant patient who presents with the following:  Discussed the use of AI scribe software for clinical note transcription with the patient, who gave verbal consent to proceed.  History of Present Illness Kyle Mercer is a 53 year old male with cirrhosis who presents for CPX.  He has a history of cirrhosis and is under the care of a hepatologist. He undergoes regular ultrasounds every six months to monitor for potential liver cancer and is due for an endoscopy, which has not yet been scheduled.  He is also under the care of a rheumatologist and is awaiting results to determine if he has ankylosing spondylitis instead of diffuse idiopathic skeletal hyperostosis. He experiences significant pain and is hopeful for pharmaceutical options if ankylosing spondylitis is confirmed. He is waiting for additional blood test results to rule out other conditions.  He experiences chronic fatigue and excessive sleep, which he attributes to his liver condition. His platelet count has been consistently low, recently dropping to 42. He has a history of low white blood cell count, which is related to his cirrhosis.  He experiences joint and back pain, which limits his physical activity. He has had Achilles tendinitis in his right ankle for almost a year and uses shoe lifts and [employer] shoes as recommended by an orthopedist. He reports issues with his knees, back, and neck as well.  He has had two doses of the hepatitis B vaccine but needs a third. He  has a history of adverse reactions to the flu shot but plans to proceed with it today.  He denies smoking, vaping, or using drugs. His physical activity is limited to working around the house and not structured exercise. He is motivated to maintain his health for his family, particularly his grandchildren.    Preventative Health Maintenance Visit:  Health Maintenance Summary Reviewed and updated, unless pt declines services.  Tobacco History Reviewed. Alcohol: No concerns, no excessive use Exercise Habits: Some activity, rec at least 30 mins 5 times a week - rare STD concerns: no risk or activity to increase risk Drug Use: None  History of alcoholic cirrhosis  Tdap Prevnar 20 Hep B COVID booster  Health Maintenance  Topic Date Due   Pneumococcal Vaccine: 50+ Years (1 of 2 - PCV) Never done   Hepatitis B Vaccines 19-59 Average Risk (3 of 3 - 19+ 3-dose series) 08/23/2019   DTaP/Tdap/Td (2 - Td or Tdap) 11/23/2022   Influenza Vaccine  09/18/2023   COVID-19 Vaccine (6 - Pfizer risk 2024-25 season) 10/19/2023   Colonoscopy  06/01/2026   Hepatitis C Screening  Completed   HIV Screening  Completed   Zoster Vaccines- Shingrix  Completed   HPV VACCINES  Aged Out   Meningococcal B Vaccine  Aged Out   Immunization History  Administered Date(s) Administered   Hep A / Hep B 02/23/2019, 03/28/2019   Influenza Inj Mdck Quad Pf 01/02/2020   Influenza, Seasonal, Injecte, Preservative Fre 11/03/2022   Influenza,inj,Quad PF,6+ Mos 11/22/2012, 11/10/2016, 11/11/2017, 10/28/2021  Influenza,inj,quad, With Preservative 03/05/2015   Influenza-Unspecified 11/16/2018   PFIZER(Purple Top)SARS-COV-2 Vaccination 05/08/2019, 05/29/2019, 12/03/2019, 06/30/2020   Pfizer(Comirnaty)Fall Seasonal Vaccine 12 years and older 12/27/2022   Tdap 11/22/2012   Zoster Recombinant(Shingrix) 09/10/2020, 11/13/2020   Patient Active Problem List   Diagnosis Date Noted   Alcoholic cirrhosis of liver without  ascites (HCC) 08/18/2019    Priority: High   Liver lesion hepatic subsegment four? 02/03/2019    Priority: Medium    Thrombocytopenia (HCC) 02/03/2019    Priority: Medium    Sleep apnea 06/25/2009    Priority: Low   Allergic rhinitis 06/25/2008    Priority: Low   GERD 06/25/2008    Priority: Low   NEPHROLITHIASIS, HX OF 06/25/2008    Priority: Low   Osteoarthritis 11/04/2023   Morbid obesity (HCC) 03/03/2022   Hx of adenomatous and sessile serrated colonic polyps 06/11/2021   Anxiety state 06/25/2008    Past Medical History:  Diagnosis Date   Alcoholic cirrhosis of liver without ascites (HCC) 08/18/2019   Anxiety    GERD (gastroesophageal reflux disease)    History of nephrolithiasis    Hx of adenomatous and sessile serrated colonic polyps 06/11/2021   Hyperlipidemia    Kidney stones    Liver lesion    OSA on CPAP    Seasonal allergies    Substance abuse (HCC)    H/O ETOH   Thrombocytopenia (HCC) 02/03/2019    Past Surgical History:  Procedure Laterality Date   ESOPHAGOGASTRODUODENOSCOPY  10/2018   ROTATOR CUFF REPAIR Right     Family History  Problem Relation Age of Onset   Diabetes Mother    Heart disease Mother    Colon polyps Father    Diabetes Father    Heart disease Father    Diabetes Brother    Esophageal cancer Neg Hx    Pancreatic cancer Neg Hx    Stomach cancer Neg Hx    Liver disease Neg Hx    Colon cancer Neg Hx    Crohn's disease Neg Hx    Rectal cancer Neg Hx    Ulcerative colitis Neg Hx     Social History   Social History Narrative   Married, 1 biologic son born 67, 1 daughter by marriage born 1982 when he has grandchildren    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  No drug use.  Update May 2023-wine only on the weekends   regular exercise- no      Quit alcohol August 2023     Past Medical History, Surgical History, Social History, Family History, Problem List, Medications, and Allergies have been reviewed and updated if relevant.  Review of Systems: Pertinent positives are listed above.  Otherwise, a full 14 point review of systems has been done in full and it is negative except where it is noted positive.  Objective:   BP 120/64   Pulse 74   Temp 97.8 F (36.6 C) (Temporal)   Ht 5' 9.5 (1.765 m)   Wt 224 lb 6 oz (101.8 kg)   SpO2 98%   BMI 32.66 kg/m  Ideal Body Weight: Weight in (lb) to have BMI = 25: 171.4  Ideal Body Weight: Weight in (lb) to have BMI = 25: 171.4 No results found.    11/04/2023    8:56 AM 11/03/2022  10:11 AM 10/28/2021    2:09 PM 08/17/2019    2:40 PM 11/11/2017    8:56 AM  Depression screen PHQ 2/9  Decreased Interest 0 0 0 0 0  Down, Depressed, Hopeless 0 0 0 0 0  PHQ - 2 Score 0 0 0 0 0     GEN: well developed, well nourished, no acute distress Eyes: conjunctiva and lids normal, PERRLA, EOMI ENT: TM clear, nares clear, oral exam WNL Neck: supple, no lymphadenopathy, no thyromegaly, no JVD Pulm: clear to auscultation and percussion, respiratory effort normal CV: regular rate and rhythm, S1-S2, no murmur, rub or gallop, no bruits, peripheral pulses normal and symmetric, no cyanosis, clubbing, edema or varicosities GI: soft, non-tender; no hepatosplenomegaly, masses; active bowel sounds all quadrants GU: deferred Lymph: no cervical, axillary or inguinal adenopathy MSK: gait normal, muscle tone and strength WNL, no joint swelling, effusions, discoloration, crepitus  SKIN: clear, good turgor, color WNL, no rashes, lesions, or ulcerations Neuro: normal mental status, normal strength, sensation, and motion Psych: alert; oriented to person, place and time, normally interactive and not anxious or depressed in appearance.  All labs reviewed with patient. 09/05/2023 Last OV with Jacques Schroeder, MD   Assessment and Plan:      ICD-10-CM   1. Healthcare maintenance  Z00.00     2. Thrombocytopenia (HCC)  D69.6 Ambulatory referral to Hematology / Oncology    3. Alcoholic cirrhosis of liver without ascites (HCC)  K70.30 Ambulatory referral to Hematology / Oncology     Assessment & Plan  Adult Wellness Visit Routine wellness visit. Discussed health maintenance, vaccinations, and physical activity. Up to date on cancer screenings and labs, except liver function and thrombocytopenia-related abnormalities. - Administer flu and tetanus vaccines. - Schedule pneumonia and third hepatitis B vaccines, Covid booster - Encourage structured physical activity.  Alcoholic cirrhosis of liver with thrombocytopenia and fatigue Chronic alcoholic cirrhosis with thrombocytopenia and fatigue. Platelet count at 42, elevated bilirubin levels. Thrombocytopenia likely secondary to cirrhosis. Abstinence from alcohol crucial. Considering second opinion for advanced liver treatment. - Refer to hematology for thrombocytopenia evaluation. - Discuss second opinion with [facility] or [facility] hepatology. - Continue alcohol abstinence. - Monitor liver function and platelet count regularly.  I am not entirely sure what to do about the patient's thrombocytopenia which continues to worsen.  And a platelet count of 42 is worrisome.  I would like to get the opinion of hematology to see if there is anything additional we should be doing.  Chronic joint pain and right Achilles tendinitis Chronic joint pain in knees, back, neck, and persistent right Achilles tendinitis. Uses orthopedic-recommended shoe lifts and supportive footwear. Pain management and physical activity limited. - Continue using shoe lifts and supportive footwear.   Patient Instructions   Prevnar 20 (pneumonia)  Hep B #3   Covid booster   Health Maintenance Exam: The patient's preventative maintenance and recommended screening tests for an annual wellness exam were reviewed in full  today. Brought up to date unless services declined.  Counselled on the importance of diet, exercise, and its role in overall health and mortality. The patient's FH and SH was reviewed, including their home life, tobacco status, and drug and alcohol status.  Follow-up in 1 year for physical exam or additional follow-up below.  Disposition: No follow-ups on file.  No orders of the defined types were placed in this encounter.  There are no discontinued medications. Orders Placed This Encounter  Procedures   Ambulatory referral to Hematology / Oncology  Signed,  Jacques DASEN. Abir Craine, MD   Allergies as of 11/04/2023       Reactions   Cefdinir    REACTION: cramping in stomach, vomiting and diarrhea        Medication List        Accurate as of November 04, 2023  9:31 AM. If you have any questions, ask your nurse or doctor.          famotidine 20 MG tablet Commonly known as: PEPCID Take 20 mg by mouth daily as needed for heartburn or indigestion.   fluticasone  50 MCG/ACT nasal spray Commonly known as: FLONASE  PLACE 2 SPRAYS INTO THE NOSE ONCE DAILY.   sertraline  50 MG tablet Commonly known as: ZOLOFT  TAKE 1 TABLET BY MOUTH EVERY DAY

## 2023-11-02 ENCOUNTER — Other Ambulatory Visit (INDEPENDENT_AMBULATORY_CARE_PROVIDER_SITE_OTHER)

## 2023-11-02 ENCOUNTER — Telehealth: Payer: Self-pay

## 2023-11-02 DIAGNOSIS — K7030 Alcoholic cirrhosis of liver without ascites: Secondary | ICD-10-CM

## 2023-11-02 DIAGNOSIS — Z131 Encounter for screening for diabetes mellitus: Secondary | ICD-10-CM | POA: Diagnosis not present

## 2023-11-02 DIAGNOSIS — Z13220 Encounter for screening for lipoid disorders: Secondary | ICD-10-CM | POA: Diagnosis not present

## 2023-11-02 DIAGNOSIS — R5383 Other fatigue: Secondary | ICD-10-CM

## 2023-11-02 DIAGNOSIS — Z79899 Other long term (current) drug therapy: Secondary | ICD-10-CM

## 2023-11-02 DIAGNOSIS — E559 Vitamin D deficiency, unspecified: Secondary | ICD-10-CM

## 2023-11-02 DIAGNOSIS — Z125 Encounter for screening for malignant neoplasm of prostate: Secondary | ICD-10-CM

## 2023-11-02 LAB — CBC WITH DIFFERENTIAL/PLATELET
Basophils Absolute: 0 K/uL (ref 0.0–0.1)
Basophils Relative: 1.2 % (ref 0.0–3.0)
Eosinophils Absolute: 0.1 K/uL (ref 0.0–0.7)
Eosinophils Relative: 3.5 % (ref 0.0–5.0)
HCT: 39.8 % (ref 39.0–52.0)
Hemoglobin: 13.5 g/dL (ref 13.0–17.0)
Lymphocytes Relative: 25.7 % (ref 12.0–46.0)
Lymphs Abs: 0.6 K/uL — ABNORMAL LOW (ref 0.7–4.0)
MCHC: 33.9 g/dL (ref 30.0–36.0)
MCV: 100.7 fl — ABNORMAL HIGH (ref 78.0–100.0)
Monocytes Absolute: 0.3 K/uL (ref 0.1–1.0)
Monocytes Relative: 11.9 % (ref 3.0–12.0)
Neutro Abs: 1.4 K/uL (ref 1.4–7.7)
Neutrophils Relative %: 57.7 % (ref 43.0–77.0)
Platelets: 42 K/uL — CL (ref 150.0–400.0)
RBC: 3.96 Mil/uL — ABNORMAL LOW (ref 4.22–5.81)
RDW: 14.5 % (ref 11.5–15.5)
WBC: 2.5 K/uL — ABNORMAL LOW (ref 4.0–10.5)

## 2023-11-02 LAB — BASIC METABOLIC PANEL WITH GFR
BUN: 6 mg/dL (ref 6–23)
CO2: 29 meq/L (ref 19–32)
Calcium: 8.3 mg/dL — ABNORMAL LOW (ref 8.4–10.5)
Chloride: 107 meq/L (ref 96–112)
Creatinine, Ser: 0.66 mg/dL (ref 0.40–1.50)
GFR: 107.14 mL/min (ref >60.00)
Glucose, Bld: 99 mg/dL (ref 70–99)
Potassium: 4 meq/L (ref 3.5–5.1)
Sodium: 142 meq/L (ref 135–145)

## 2023-11-02 LAB — HEPATIC FUNCTION PANEL
ALT: 20 U/L (ref 0–53)
AST: 52 U/L — ABNORMAL HIGH (ref 0–37)
Albumin: 3.4 g/dL — ABNORMAL LOW (ref 3.5–5.2)
Alkaline Phosphatase: 109 U/L (ref 39–117)
Bilirubin, Direct: 0.8 mg/dL — ABNORMAL HIGH (ref 0.0–0.3)
Total Bilirubin: 2.4 mg/dL — ABNORMAL HIGH (ref 0.2–1.2)
Total Protein: 6.5 g/dL (ref 6.0–8.3)

## 2023-11-02 LAB — LIPID PANEL
Cholesterol: 199 mg/dL (ref 0–200)
HDL: 60 mg/dL (ref >39.00)
LDL Cholesterol: 119 mg/dL — ABNORMAL HIGH (ref 0–99)
NonHDL: 138.8
Total CHOL/HDL Ratio: 3
Triglycerides: 97 mg/dL (ref 0.0–149.0)
VLDL: 19.4 mg/dL (ref 0.0–40.0)

## 2023-11-02 LAB — VITAMIN B12: Vitamin B-12: 375 pg/mL (ref 211–911)

## 2023-11-02 LAB — VITAMIN D 25 HYDROXY (VIT D DEFICIENCY, FRACTURES): VITD: 38.31 ng/mL (ref 30.00–100.00)

## 2023-11-02 LAB — HEMOGLOBIN A1C: Hgb A1c MFr Bld: 4.7 % (ref 4.6–6.5)

## 2023-11-02 NOTE — Telephone Encounter (Signed)
 Not very different from recent readings.  Known Cirrhosis.

## 2023-11-02 NOTE — Telephone Encounter (Signed)
 Received call from [facility], with the following critical lab results: Low platelet count- 42, 000

## 2023-11-03 ENCOUNTER — Ambulatory Visit: Payer: Self-pay | Admitting: Family Medicine

## 2023-11-04 ENCOUNTER — Ambulatory Visit: Admitting: Family Medicine

## 2023-11-04 ENCOUNTER — Encounter: Payer: Self-pay | Admitting: Family Medicine

## 2023-11-04 VITALS — BP 120/64 | HR 74 | Temp 97.8°F | Ht 69.5 in | Wt 224.4 lb

## 2023-11-04 DIAGNOSIS — Z23 Encounter for immunization: Secondary | ICD-10-CM

## 2023-11-04 DIAGNOSIS — Z0000 Encounter for general adult medical examination without abnormal findings: Secondary | ICD-10-CM

## 2023-11-04 DIAGNOSIS — K7030 Alcoholic cirrhosis of liver without ascites: Secondary | ICD-10-CM | POA: Diagnosis not present

## 2023-11-04 DIAGNOSIS — D696 Thrombocytopenia, unspecified: Secondary | ICD-10-CM

## 2023-11-04 DIAGNOSIS — M1990 Unspecified osteoarthritis, unspecified site: Secondary | ICD-10-CM | POA: Insufficient documentation

## 2023-11-04 LAB — PSA, TOTAL WITH REFLEX TO PSA, FREE: PSA, Total: 0.3 ng/mL (ref <=4.0)

## 2023-11-04 NOTE — Addendum Note (Signed)
 Addended by: WENDELL ARLAND RAMAN on: 11/04/2023 09:42 AM   Modules accepted: Orders

## 2023-11-04 NOTE — Patient Instructions (Signed)
  Prevnar 20 (pneumonia)  Hep B #3   Covid booster

## 2023-11-10 ENCOUNTER — Encounter: Payer: Self-pay | Admitting: Internal Medicine

## 2023-11-16 ENCOUNTER — Inpatient Hospital Stay: Attending: Oncology | Admitting: Oncology

## 2023-11-16 ENCOUNTER — Inpatient Hospital Stay

## 2023-11-16 ENCOUNTER — Encounter: Payer: Self-pay | Admitting: Oncology

## 2023-11-16 VITALS — BP 133/75 | HR 73 | Temp 97.7°F | Resp 18 | Wt 227.8 lb

## 2023-11-16 DIAGNOSIS — Z87891 Personal history of nicotine dependence: Secondary | ICD-10-CM | POA: Diagnosis not present

## 2023-11-16 DIAGNOSIS — K7030 Alcoholic cirrhosis of liver without ascites: Secondary | ICD-10-CM | POA: Insufficient documentation

## 2023-11-16 DIAGNOSIS — D72819 Decreased white blood cell count, unspecified: Secondary | ICD-10-CM | POA: Diagnosis not present

## 2023-11-16 DIAGNOSIS — D696 Thrombocytopenia, unspecified: Secondary | ICD-10-CM | POA: Diagnosis not present

## 2023-11-16 DIAGNOSIS — D709 Neutropenia, unspecified: Secondary | ICD-10-CM | POA: Diagnosis not present

## 2023-11-16 DIAGNOSIS — R011 Cardiac murmur, unspecified: Secondary | ICD-10-CM | POA: Insufficient documentation

## 2023-11-16 LAB — CBC WITH DIFFERENTIAL/PLATELET
Abs Immature Granulocytes: 0 K/uL (ref 0.00–0.07)
Band Neutrophils: 0 %
Basophils Absolute: 0 K/uL (ref 0.0–0.1)
Basophils Relative: 1 %
Blasts: 0 %
Eosinophils Absolute: 0.1 K/uL (ref 0.0–0.5)
Eosinophils Relative: 3 %
HCT: 38.3 % — ABNORMAL LOW (ref 39.0–52.0)
Hemoglobin: 13.3 g/dL (ref 13.0–17.0)
Immature Granulocytes: 0 %
Lymphocytes Relative: 26 %
Lymphs Abs: 0.6 K/uL — ABNORMAL LOW (ref 0.7–4.0)
MCH: 34.7 pg — ABNORMAL HIGH (ref 26.0–34.0)
MCHC: 34.7 g/dL (ref 30.0–36.0)
MCV: 100 fL (ref 80.0–100.0)
Metamyelocytes Relative: 0 %
Monocytes Absolute: 0.3 K/uL (ref 0.1–1.0)
Monocytes Relative: 13 %
Myelocytes: 0 %
Neutro Abs: 1.2 K/uL — ABNORMAL LOW (ref 1.7–7.7)
Neutrophils Relative %: 57 %
Other: 0 %
Platelets: 39 K/uL — ABNORMAL LOW (ref 150–400)
Promyelocytes Relative: 0 %
RBC: 3.83 MIL/uL — ABNORMAL LOW (ref 4.22–5.81)
RDW: 13.7 % (ref 11.5–15.5)
WBC: 2.2 K/uL — ABNORMAL LOW (ref 4.0–10.5)
nRBC: 0 % (ref 0.0–0.2)
nRBC: 0 /100{WBCs}

## 2023-11-16 LAB — TECHNOLOGIST SMEAR REVIEW
Plt Morphology: NORMAL
RBC MORPHOLOGY: NORMAL
WBC MORPHOLOGY: NORMAL

## 2023-11-16 LAB — FOLATE: Folate: 16.9 ng/mL (ref >5.9)

## 2023-11-16 LAB — IMMATURE PLATELET FRACTION: Immature Platelet Fraction: 1.6 % (ref 1.2–8.6)

## 2023-11-16 NOTE — Assessment & Plan Note (Signed)
 Recommend patient further discuss with primary care provider for evaluation.

## 2023-11-16 NOTE — Assessment & Plan Note (Addendum)
 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 Rule out other etiologies. Check CBC, smear, flow cytometry, B12, multiple myeloma panel, immature platelet fraction.  Smear.  B12 was previously checked and is at low normal end.  Recommend patient to empirically take vitamin B12 oral supplementation.

## 2023-11-16 NOTE — Assessment & Plan Note (Signed)
 Likely secondary to cirrhosis. Pending above workup.  Check flow cytometry.

## 2023-11-16 NOTE — Assessment & Plan Note (Addendum)
 Follow-up with hepatology.  AFP has been monitored. Patient gets ultrasound liver for [facility] screening.

## 2023-11-16 NOTE — Progress Notes (Addendum)
 Hematology/Oncology Consult note [phone] [phone]        REFERRING PROVIDER: Watt Mirza, MD   CHIEF COMPLAINTS/REASON FOR VISIT:  Evaluation of thrombocytopenia.   ASSESSMENT & PLAN:   Thrombocytopenia Chronic, cytopenia, likely secondary to cirrhosis/splenomegaly. Rule out other etiologies. Check CBC, smear, flow cytometry, B12, multiple myeloma panel, immature platelet fraction.  Smear.  B12 was previously checked and is at low normal end.  Recommend patient to empirically take vitamin B12 oral supplementation.  Alcoholic cirrhosis of liver without ascites (HCC) Follow-up with hepatology.  AFP has been monitored. Patient gets ultrasound liver for [facility] screening.  Leukopenia Likely secondary to cirrhosis. Pending above workup.  Check flow cytometry.  Heart murmur Recommend patient further discuss with primary care provider for evaluation.   Orders Placed This Encounter  Procedures   CBC with Differential/Platelet    Standing Status:   Future    Number of Occurrences:   1    Expected Date:   11/16/2023    Expiration Date:   02/14/2024   Immature Platelet Fraction    Standing Status:   Future    Number of Occurrences:   1    Expected Date:   11/16/2023    Expiration Date:   02/14/2024   Multiple Myeloma Panel (SPEP&IFE w/QIG)    Standing Status:   Future    Number of Occurrences:   1    Expected Date:   11/16/2023    Expiration Date:   02/14/2024   Kappa/lambda light chains    Standing Status:   Future    Number of Occurrences:   1    Expected Date:   11/16/2023    Expiration Date:   02/14/2024   Flow cytometry panel-leukemia/lymphoma work-up    Standing Status:   Future    Number of Occurrences:   1    Expected Date:   11/16/2023    Expiration Date:   02/14/2024   Folate    Standing Status:   Future    Number of Occurrences:   1    Expected Date:   11/16/2023    Expiration Date:   02/14/2024   Technologist smear review     Standing Status:   Future    Number of Occurrences:   1    Expected Date:   11/16/2023    Expiration Date:   02/14/2024    Clinical information::   thrombocytopenia   Follow-up in 3 months. All questions were answered. The patient knows to call the clinic with any problems, questions or concerns.  Zelphia Cap, MD, PhD [facility] Hematology Oncology 11/16/2023   HISTORY OF PRESENTING ILLNESS:   Kyle Mercer is a  53 y.o.  male with PMH listed below was seen in consultation at the request of  Copland, Mirza, MD  for evaluation of thrombocytopenia.   Discussed the use of AI scribe software for clinical note transcription with the patient, who gave verbal consent to proceed.  Denies any acute bleeding events.  Patient has chronic thrombocytopenia which has progressively get worse recently.  He has a history of cirrhosis secondary to alcohol use, which he stopped two years ago. He was diagnosed with DISH within the last year and previously used alcohol for pain management.   His vitamin B12 level is 375, and he is not on B12 supplements. No unintentional weight loss or night sweats. He has not had recent procedures requiring clearance for low platelet counts.  He underwent a colonoscopy three years  ago and an EGD two years ago, with a recommendation for another EGD for variceal screening. He is followed by [facility] hepatology.  AFP has been monitored periodically.        MEDICAL HISTORY:  Past Medical History:  Diagnosis Date   Alcoholic cirrhosis of liver without ascites (HCC) 08/18/2019   Anxiety    GERD (gastroesophageal reflux disease)    History of nephrolithiasis    Hx of adenomatous and sessile serrated colonic polyps 06/11/2021   Hyperlipidemia    Kidney stones    Liver lesion    OSA on CPAP    Seasonal allergies    Substance abuse (HCC)    H/O ETOH   Thrombocytopenia 02/03/2019    SURGICAL HISTORY: Past Surgical History:  Procedure Laterality Date    ESOPHAGOGASTRODUODENOSCOPY  10/2018   ROTATOR CUFF REPAIR Right     SOCIAL HISTORY: Social History   Socioeconomic History   Marital status: Married    Spouse name: Not on file   Number of children: Not on file   Years of education: Not on file   Highest education level: Not on file  Occupational History   Occupation: SOFTWARE Secondary school teacher: [employer]    Comment: Engineer, 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  No drug use.  Update May 2023-wine only on the weekends   regular exercise- no      Quit alcohol August 2023   Social Drivers of Health   Financial Resource Strain: Low Risk  (11/16/2023)   Overall Financial Resource Strain (CARDIA)    Difficulty of Paying Living Expenses: Not very hard  Food Insecurity: No Food Insecurity (11/16/2023)   Hunger Vital Sign    Worried About Running Out of Food in the Last Year: Never true    Ran Out of Food in the Last Year: Never true  Transportation Needs: No Transportation Needs (11/16/2023)   PRAPARE - Therapist, art (Medical): No    Lack of Transportation (Non-Medical): No  Physical Activity: Not on file  Stress: No Stress Concern Present (11/16/2023)   [employer] of Occupational Health - Occupational Stress Questionnaire    Feeling of Stress: Not at all  Social Connections: Not on file  Intimate Partner Violence: Not At Risk (11/16/2023)   Humiliation, Afraid, Rape, and Kick questionnaire    Fear of Current or Ex-Partner:  No    Emotionally Abused: No    Physically Abused: No    Sexually Abused: No    FAMILY HISTORY: Family History  Problem Relation Age of Onset   Diabetes Mother    Heart disease Mother    Colon polyps Father    Diabetes Father    Heart disease Father    Diabetes Brother    Esophageal cancer Neg Hx    Pancreatic cancer Neg Hx    Stomach cancer Neg Hx    Liver disease Neg Hx    Colon cancer Neg Hx    Crohn's disease Neg Hx    Rectal cancer Neg Hx    Ulcerative colitis Neg Hx     ALLERGIES:  is allergic to cefdinir.  MEDICATIONS:  Current Outpatient Medications  Medication Sig Dispense Refill   famotidine (PEPCID) 20 MG tablet Take 20 mg by mouth daily as needed for heartburn or indigestion.     fluticasone  (FLONASE ) 50 MCG/ACT nasal spray PLACE 2 SPRAYS INTO THE NOSE ONCE DAILY. 48 mL 0   sertraline  (ZOLOFT ) 50 MG tablet TAKE 1 TABLET BY MOUTH EVERY DAY 90 tablet 3   No current facility-administered medications for this visit.    Review of Systems  Constitutional:  Positive for fatigue. Negative for appetite change, chills, fever and unexpected weight change.  HENT:   Negative for hearing loss and voice change.   Eyes:  Negative for eye problems and icterus.  Respiratory:  Negative for chest tightness, cough and shortness of breath.   Cardiovascular:  Negative for chest pain and leg swelling.  Gastrointestinal:  Negative for abdominal distention and abdominal pain.  Endocrine: Negative for hot flashes.  Genitourinary:  Negative for  difficulty urinating, dysuria and frequency.   Musculoskeletal:  Negative for arthralgias.  Skin:  Negative for itching and rash.  Neurological:  Negative for light-headedness and numbness.  Hematological:  Negative for adenopathy. Bruises/bleeds easily.  Psychiatric/Behavioral:  Negative for confusion.    PHYSICAL EXAMINATION:  Vitals:   11/16/23 1124 11/16/23 1134  BP: (!) 158/81 133/75  Pulse: 73   Resp: 18   Temp: 97.7 F (36.5 C)   SpO2: 100%    Filed Weights   11/16/23 1124  Weight: 227 lb 12.8 oz (103.3 kg)    Physical Exam Constitutional:      General: He is not in acute distress. HENT:     Head: Normocephalic and atraumatic.  Eyes:     General: No scleral icterus. Cardiovascular:     Rate and Rhythm: Normal rate and regular rhythm.     Heart sounds: Murmur heard.  Pulmonary:     Effort: Pulmonary effort is normal. No respiratory distress.     Breath sounds: No wheezing.  Abdominal:     General: Bowel sounds are normal. There is no distension.     Palpations: Abdomen is soft.  Musculoskeletal:        General: No deformity. Normal range of motion.     Cervical back: Normal range of motion and neck supple.  Skin:    General: Skin is warm and dry.     Findings: No erythema or rash.  Neurological:     Mental Status: He is alert and oriented to person, place, and time. Mental status is at baseline.     Cranial Nerves: No cranial nerve deficit.     Coordination: Coordination normal.  Psychiatric:        Mood and Affect: Mood normal.  LABORATORY DATA:  I have reviewed the data as listed    Latest Ref Rng & Units 11/16/2023   12:03 PM 11/02/2023   11:28 AM 10/27/2022    9:33 AM  CBC  WBC 4.0 - 10.5 K/uL 2.2  2.5  2.7   Hemoglobin 13.0 - 17.0 g/dL 86.6  86.4  85.8   Hematocrit 39.0 - 52.0 % 38.3  39.8  42.0   Platelets 150 - 400 K/uL 39  42.0 Repeated and verified X2.  55.0       Latest Ref Rng & Units 11/02/2023   11:28 AM 10/27/2022    9:33 AM  06/24/2022    9:31 AM  CMP  Glucose 70 - 99 mg/dL 99  879  877   BUN 6 - 23 mg/dL 6  6  10    Creatinine 0.40 - 1.50 mg/dL 9.33  9.22  9.31   Sodium 135 - 145 mEq/L 142  140  133   Potassium 3.5 - 5.1 mEq/L 4.0  4.1  3.6   Chloride 96 - 112 mEq/L 107  104  103   CO2 19 - 32 mEq/L 29  31  24    Calcium 8.4 - 10.5 mg/dL 8.3  8.5  9.0   Total Protein 6.0 - 8.3 g/dL 6.5  6.8  7.1   Total Bilirubin 0.2 - 1.2 mg/dL 2.4  2.3  4.7   Alkaline Phos 39 - 117 U/L 109  101  89   AST 0 - 37 U/L 52  56  59   ALT 0 - 53 U/L 20  23  24        RADIOGRAPHIC STUDIES: I have personally reviewed the radiological images as listed and agreed with the findings in the report. No results found.

## 2023-11-17 LAB — KAPPA/LAMBDA LIGHT CHAINS
Kappa free light chain: 39.2 mg/L — ABNORMAL HIGH (ref 3.3–19.4)
Kappa, lambda light chain ratio: 1.33 (ref 0.26–1.65)
Lambda free light chains: 29.4 mg/L — ABNORMAL HIGH (ref 5.7–26.3)

## 2023-11-18 LAB — MULTIPLE MYELOMA PANEL, SERUM
Albumin SerPl Elph-Mcnc: 3.1 g/dL (ref 2.9–4.4)
Albumin/Glob SerPl: 1 (ref 0.7–1.7)
Alpha 1: 0.2 g/dL (ref 0.0–0.4)
Alpha2 Glob SerPl Elph-Mcnc: 0.4 g/dL (ref 0.4–1.0)
B-Globulin SerPl Elph-Mcnc: 1.1 g/dL (ref 0.7–1.3)
Gamma Glob SerPl Elph-Mcnc: 1.7 g/dL (ref 0.4–1.8)
Globulin, Total: 3.4 g/dL (ref 2.2–3.9)
IgA: 626 mg/dL — ABNORMAL HIGH (ref 90–386)
IgG (Immunoglobin G), Serum: 1781 mg/dL — ABNORMAL HIGH (ref 603–1613)
IgM (Immunoglobulin M), Srm: 86 mg/dL (ref 20–172)
Total Protein ELP: 6.5 g/dL (ref 6.0–8.5)

## 2023-11-19 LAB — COMP PANEL: LEUKEMIA/LYMPHOMA

## 2023-11-21 ENCOUNTER — Ambulatory Visit: Payer: Self-pay | Admitting: Oncology

## 2023-12-02 ENCOUNTER — Other Ambulatory Visit: Payer: Self-pay | Admitting: Family Medicine

## 2023-12-16 ENCOUNTER — Ambulatory Visit
Admission: RE | Admit: 2023-12-16 | Discharge: 2023-12-16 | Disposition: A | Discharge status: Home / Self Care | Attending: Physician Assistant | Admitting: Physician Assistant

## 2023-12-16 ENCOUNTER — Ambulatory Visit: Payer: Self-pay

## 2023-12-16 VITALS — BP 163/92 | HR 93 | Temp 98.7°F | Resp 18 | Wt 227.9 lb

## 2023-12-16 DIAGNOSIS — R519 Headache, unspecified: Secondary | ICD-10-CM | POA: Diagnosis not present

## 2023-12-16 DIAGNOSIS — B349 Viral infection, unspecified: Secondary | ICD-10-CM

## 2023-12-16 DIAGNOSIS — R051 Acute cough: Secondary | ICD-10-CM | POA: Diagnosis not present

## 2023-12-16 DIAGNOSIS — R197 Diarrhea, unspecified: Secondary | ICD-10-CM | POA: Diagnosis not present

## 2023-12-16 DIAGNOSIS — R0981 Nasal congestion: Secondary | ICD-10-CM

## 2023-12-16 LAB — SARS CORONAVIRUS 2 BY RT PCR: SARS Coronavirus 2 by RT PCR: NEGATIVE

## 2023-12-16 MED ORDER — IPRATROPIUM BROMIDE 0.06 % NA SOLN: 2.0000 | Freq: Four times a day (QID) | NASAL | 0 refills | Status: DC

## 2023-12-16 MED ORDER — PSEUDOEPH-BROMPHEN-DM 30-2-10 MG/5ML PO SYRP
10.0000 mL | ORAL_SOLUTION | Freq: Four times a day (QID) | ORAL | 0 refills | Status: AC | PRN
Start: 2023-12-16 — End: 2023-12-23

## 2023-12-16 NOTE — ED Provider Notes (Signed)
 [facility]    CSN: [id number] Arrival date & time: 12/16/23  1115      History   Chief Complaint Chief Complaint  Patient presents with   Headache   Cough   Diarrhea    HPI ULYESS Mercer is a 53 y.o. male presenting for 3 day history of fatigue, sweats, chills, cough, congestion, abdominal pain, and diarrhea. No recorded fever, sore throat, vomiting, or weakness. Has taken Imodium, Robitussin and other decongestants OTC. Patient recently flew to [place] before onset of symptoms. Reports being on 4 different flights. No other concerns.   HPI  Past Medical History:  Diagnosis Date   Alcoholic cirrhosis of liver without ascites (HCC) 08/18/2019   Anxiety    GERD (gastroesophageal reflux disease)    History of nephrolithiasis    Hx of adenomatous and sessile serrated colonic polyps 06/11/2021   Hyperlipidemia    Kidney stones    Liver lesion    OSA on CPAP    Seasonal allergies    Substance abuse (HCC)    H/O ETOH   Thrombocytopenia 02/03/2019    Patient Active Problem List   Diagnosis Date Noted   Leukopenia 11/16/2023   Heart murmur 11/16/2023   Osteoarthritis 11/04/2023   Morbid obesity (HCC) 03/03/2022   Hx of adenomatous and sessile serrated colonic polyps 06/11/2021   Alcoholic cirrhosis of liver without ascites (HCC) 08/18/2019   Liver lesion hepatic subsegment four? 02/03/2019   Thrombocytopenia 02/03/2019   Sleep apnea 06/25/2009   Anxiety state 06/25/2008   Allergic rhinitis 06/25/2008   GERD 06/25/2008   NEPHROLITHIASIS, HX OF 06/25/2008    Past Surgical History:  Procedure Laterality Date   ESOPHAGOGASTRODUODENOSCOPY  10/2018   ROTATOR CUFF REPAIR Right        Home Medications    Prior to Admission medications   Medication Sig Start Date End Date Taking? Authorizing Provider  brompheniramine-pseudoephedrine-DM 30-2-10 MG/5ML syrup Take 10 mLs by mouth 4 (four) times daily as needed for up to 7 days. 12/16/23 12/23/23 Yes Arvis Huxley B, PA-C  ipratropium (ATROVENT ) 0.06 % nasal spray Place 2 sprays into both nostrils 4 (four) times daily. 12/16/23  Yes Arvis Huxley B, PA-C  famotidine (PEPCID) 20 MG tablet Take 20 mg by mouth daily as needed for heartburn or indigestion.    [provider]  fluticasone  (FLONASE ) 50 MCG/ACT nasal spray PLACE 2 SPRAYS INTO THE NOSE ONCE DAILY. 09/07/23   Copland, Jacques, MD  sertraline  (ZOLOFT ) 50 MG tablet TAKE 1 TABLET BY MOUTH EVERY DAY 12/02/23   Copland, Jacques, MD    Family History Family History  Problem Relation Age of Onset   Diabetes Mother    Heart disease Mother    Colon polyps Father    Diabetes Father    Heart disease Father    Diabetes Brother    Esophageal cancer Neg Hx    Pancreatic cancer Neg Hx    Stomach cancer Neg Hx    Liver disease Neg Hx    Colon cancer Neg Hx    Crohn's disease Neg Hx    Rectal cancer Neg Hx    Ulcerative colitis Neg Hx     Social History Social History   Tobacco Use   Smoking status: Former    Current packs/day: 0.00    Types: Cigarettes    Start date: 02/17/1993    Quit date: 02/18/2000    Years since quitting: 23.8    Passive exposure: Past   Smokeless tobacco:  Never   Tobacco comments:    3-4 cigarettes per day at the most (recreational).  03/03/2021 hfb  Vaping Use   Vaping status: Never Used  Substance Use Topics   Alcohol use: Not Currently    Alcohol/week: 21.0 standard drinks of alcohol    Types: 21 Standard drinks or equivalent per week   Drug use: No     Allergies   Cefdinir   Review of Systems Review of Systems  Constitutional:  Positive for chills, diaphoresis and fatigue. Negative for fever.  HENT:  Positive for congestion and rhinorrhea. Negative for sinus pressure, sinus pain and sore throat.   Respiratory:  Positive for cough. Negative for shortness of breath.   Cardiovascular:  Negative for chest pain.  Gastrointestinal:  Positive for abdominal pain and diarrhea. Negative for nausea  and vomiting.  Musculoskeletal:  Negative for myalgias.  Neurological:  Positive for headaches. Negative for weakness and light-headedness.  Hematological:  Negative for adenopathy.     Physical Exam Triage Vital Signs ED Triage Vitals  Encounter Vitals Group     BP      Girls Systolic BP Percentile      Girls Diastolic BP Percentile      Boys Systolic BP Percentile      Boys Diastolic BP Percentile      Pulse      Resp      Temp      Temp src      SpO2      Weight      Height      Head Circumference      Peak Flow      Pain Score      Pain Loc      Pain Education      Exclude from Growth Chart    No data found.  Updated Vital Signs BP (!) 163/92 (BP Location: Right Arm)   Pulse 93   Temp 98.7 F (37.1 C) (Oral)   Resp 18   Wt 227 lb 14.4 oz (103.4 kg)   SpO2 98%   BMI 33.17 kg/m      Physical Exam Vitals and nursing note reviewed.  Constitutional:      General: He is not in acute distress.    Appearance: Normal appearance. He is well-developed. He is not ill-appearing.  HENT:     Head: Normocephalic and atraumatic.     Right Ear: Tympanic membrane, ear canal and external ear normal.     Left Ear: Tympanic membrane, ear canal and external ear normal.     Nose: Congestion present.     Mouth/Throat:     Mouth: Mucous membranes are moist.     Pharynx: Oropharynx is clear.  Eyes:     General: No scleral icterus.    Conjunctiva/sclera: Conjunctivae normal.  Cardiovascular:     Rate and Rhythm: Normal rate and regular rhythm.  Pulmonary:     Effort: Pulmonary effort is normal. No respiratory distress.     Breath sounds: Normal breath sounds.  Abdominal:     Palpations: Abdomen is soft.     Tenderness: There is no abdominal tenderness.  Musculoskeletal:     Cervical back: Neck supple.  Skin:    General: Skin is warm and dry.     Capillary Refill: Capillary refill takes less than 2 seconds.  Neurological:     General: No focal deficit present.      Mental Status: He is alert. Mental status is at baseline.  Motor: No weakness.     Gait: Gait normal.  Psychiatric:        Mood and Affect: Mood normal.        Behavior: Behavior normal.      UC Treatments / Results  Labs (all labs ordered are listed, but only abnormal results are displayed) Labs Reviewed  SARS CORONAVIRUS 2 BY RT PCR    EKG   Radiology No results found.  Procedures Procedures (including critical care time)  Medications Ordered in UC Medications - No data to display  Initial Impression / Assessment and Plan / UC Course  I have reviewed the triage vital signs and the nursing notes.  Pertinent labs & imaging results that were available during my care of the patient were reviewed by me and considered in my medical decision making (see chart for details).   53 y/o male presents for fatigue, chills, sweats, cough, congestion, abdominal pain, and diarrhea x 3 days.  Vitals are stable and he is overall well appearing. NAD. On exam, has nasal congestion. Throat clear. Chest clear. Abdomen soft and non tender.   Resp panel obtained. Negative.   Viral illness. Supportive care advised. Sent Bromfed DM and Atrovent  nasal spray. Reviewed typical course of most viral illnesses. Reviewed return precautions.   Final Clinical Impressions(s) / UC Diagnoses   Final diagnoses:  Viral illness  Acute nonintractable headache, unspecified headache type  Acute cough  Diarrhea, unspecified type  Nasal congestion     Discharge Instructions      -Negative COVID/flu  URI/COLD SYMPTOMS: Your exam today is consistent with a viral illness. Antibiotics are not indicated at this time. Use medications as directed, including cough syrup, nasal saline, and decongestants. Your symptoms should improve over the next few days and resolve within 7-10 days. Increase rest and fluids. F/u if symptoms worsen or predominate such as sore throat, ear pain, productive cough, shortness of  breath, or if you develop high fevers or worsening fatigue over the next several days.       ED Prescriptions     Medication Sig Dispense Auth. Provider   brompheniramine-pseudoephedrine-DM 30-2-10 MG/5ML syrup Take 10 mLs by mouth 4 (four) times daily as needed for up to 7 days. 150 mL Arvis Huxley B, PA-C   ipratropium (ATROVENT ) 0.06 % nasal spray Place 2 sprays into both nostrils 4 (four) times daily. 15 mL Arvis Huxley NOVAK, PA-C      PDMP not reviewed this encounter.   Arvis Huxley NOVAK, PA-C 12/16/23 1246

## 2023-12-16 NOTE — Discharge Instructions (Addendum)
-  Negative COVID/flu  URI/COLD SYMPTOMS: Your exam today is consistent with a viral illness. Antibiotics are not indicated at this time. Use medications as directed, including cough syrup, nasal saline, and decongestants. Your symptoms should improve over the next few days and resolve within 7-10 days. Increase rest and fluids. F/u if symptoms worsen or predominate such as sore throat, ear pain, productive cough, shortness of breath, or if you develop high fevers or worsening fatigue over the next several days.

## 2023-12-16 NOTE — ED Triage Notes (Addendum)
 Pt c/o diarrhea,cough,congestion & HA x3 days. Had multiple episodes of diarrhea today. States was on 4 different flights over the weekend. Has tried OTC meds w/o relief.

## 2023-12-16 NOTE — Telephone Encounter (Signed)
 FYI Only or Action Required?: FYI only for provider: UC.  Patient was last seen in primary care on 11/04/2023 by Watt Mirza, MD.  Called Nurse Triage reporting Abdominal Pain.  Symptoms began several days ago.  Interventions attempted: OTC medications: Robitussin, allegra, benadryl.  Symptoms are: unchanged.  Triage Disposition: See Physician Within 24 Hours  Patient/caregiver understands and will follow disposition?: Yes  Copied from CRM [id number]. Topic: Clinical - Red Word Triage >> Dec 16, 2023 10:43 AM China J wrote: [facility] that prompted transfer to Nurse Triage: Patient is wanting to schedule an appointment. He thinks he may have a viral infection.  Symptoms: Coughing, drainage, runny nose, diarrhea, chills, hot sweats, feeling flushed, abdominal discomfort. Reason for Disposition  SEVERE coughing spells (e.g., whooping sound after coughing, vomiting after coughing)  Answer Assessment - Initial Assessment Questions No available appts today. Advised UC and ED if symptoms worsen. Pt reports going to [facility].  1. ONSET: When did the cough begin?      Sunday; [place], robitussin, allegra 2. SEVERITY: How bad is the cough today?      mild 3. SPUTUM: Describe the color of your sputum (e.g., none, dry cough; clear, white, yellow, green)     unsure  5. DIFFICULTY BREATHING: Are you having difficulty breathing? If Yes, ask: How bad is it? (e.g., mild, moderate, severe)      no 6. FEVER: Do you have a fever? If Yes, ask: What is your temperature, how was it measured, and when did it start?     denies 7. CARDIAC HISTORY: Do you have any history of heart disease? (e.g., heart attack, congestive heart failure)      no 8. LUNG HISTORY: Do you have any history of lung disease?  (e.g., pulmonary embolus, asthma, emphysema)     no 9. PE RISK FACTORS: Do you have a history of blood clots? (or: recent major surgery, recent prolonged travel,  bedridden)     no 10. OTHER SYMPTOMS: Do you have any other symptoms? (e.g., runny nose, wheezing, chest pain)           Diarrhea since Sunday, runny nose, cough, sneezing, chills, post nasal drip night sweats, headache denies fever, n/v, dizziness, faint Diarrhea 6 x times last 24 hours, watery and loose stools; able to keep food and drinks 12. TRAVEL: Have you traveled out of the country in the last month? (e.g., travel history, exposures)       no  Protocols used: Cough - Acute Productive-A-AH

## 2023-12-21 ENCOUNTER — Encounter: Payer: Self-pay | Admitting: Nurse Practitioner

## 2023-12-21 ENCOUNTER — Other Ambulatory Visit: Payer: Self-pay | Admitting: Nurse Practitioner

## 2023-12-21 DIAGNOSIS — D696 Thrombocytopenia, unspecified: Secondary | ICD-10-CM

## 2023-12-21 DIAGNOSIS — K7030 Alcoholic cirrhosis of liver without ascites: Secondary | ICD-10-CM

## 2024-02-01 ENCOUNTER — Ambulatory Visit: Payer: Self-pay

## 2024-02-01 ENCOUNTER — Encounter: Payer: Self-pay | Admitting: Emergency Medicine

## 2024-02-01 ENCOUNTER — Ambulatory Visit
Admission: EM | Admit: 2024-02-01 | Discharge: 2024-02-01 | Attending: Emergency Medicine | Admitting: Emergency Medicine

## 2024-02-01 ENCOUNTER — Inpatient Hospital Stay

## 2024-02-01 ENCOUNTER — Inpatient Hospital Stay: Attending: Oncology | Admitting: Oncology

## 2024-02-01 ENCOUNTER — Telehealth: Payer: Self-pay | Admitting: Internal Medicine

## 2024-02-01 ENCOUNTER — Encounter: Payer: Self-pay | Admitting: Oncology

## 2024-02-01 VITALS — BP 142/72 | HR 86 | Temp 100.0°F | Resp 19 | Wt 234.4 lb

## 2024-02-01 DIAGNOSIS — K7030 Alcoholic cirrhosis of liver without ascites: Secondary | ICD-10-CM | POA: Diagnosis not present

## 2024-02-01 DIAGNOSIS — D709 Neutropenia, unspecified: Secondary | ICD-10-CM | POA: Diagnosis not present

## 2024-02-01 DIAGNOSIS — D849 Immunodeficiency, unspecified: Secondary | ICD-10-CM | POA: Diagnosis not present

## 2024-02-01 DIAGNOSIS — R1031 Right lower quadrant pain: Secondary | ICD-10-CM

## 2024-02-01 DIAGNOSIS — D696 Thrombocytopenia, unspecified: Secondary | ICD-10-CM | POA: Diagnosis present

## 2024-02-01 DIAGNOSIS — R509 Fever, unspecified: Secondary | ICD-10-CM | POA: Insufficient documentation

## 2024-02-01 NOTE — Assessment & Plan Note (Addendum)
 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 B12 was previously checked and is at low normal end.  Recommend patient to empirically take vitamin B12 oral supplementation. Repeat cbc

## 2024-02-01 NOTE — ED Triage Notes (Addendum)
 Pt presents with diarrhea x 1 month, he vomited twice yesterday and has a fever since yesterday. He has lower abdominal pain after eating or drinking. Pt has taking imodium and it has stopped the diarrhea. His symptoms started after his first Simponi infusion.

## 2024-02-01 NOTE — Progress Notes (Signed)
 Hematology/Oncology Consult note [phone] [phone]        REFERRING PROVIDER: Watt Mirza, MD   CHIEF COMPLAINTS/REASON FOR VISIT:  Leukopenia,  thrombocytopenia. 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 Rule out other etiologies. Check CBC, smear, flow cytometry, B12, multiple myeloma panel, immature platelet fraction.  Smear.  B12 was previously checked and is at low normal end.  Recommend patient to empirically take vitamin B12 oral supplementation.   Orders Placed This Encounter  Procedures   CMP (Cancer Center only)    Standing Status:   Future    Expected Date:   02/15/2024    Expiration Date:   05/15/2024   CBC with Differential (Cancer Center Only)    Standing Status:   Future    Expected Date:   02/15/2024    Expiration Date:   05/15/2024   Vitamin B12    Standing Status:   Future    Expected Date:   02/15/2024    Expiration Date:   05/15/2024   Folate    Standing Status:   Future    Expected Date:   02/15/2024    Expiration Date:   05/15/2024   Follow-up TBD All questions were answered. The patient knows to call the clinic with any problems, questions or concerns.  Zelphia Cap, MD, PhD [facility] Hematology Oncology 02/01/2024   HISTORY OF PRESENTING ILLNESS:   Kyle Mercer is a  53 y.o.  male with PMH listed below was seen in consultation at the request of  Copland, Mirza, MD  for evaluation of thrombocytopenia.   Discussed the use of AI scribe software for clinical note transcription with the patient, who gave verbal consent to proceed.  Denies any acute bleeding events.  Patient has chronic thrombocytopenia which has progressively get worse recently.  He has a history of cirrhosis secondary to alcohol use, which he stopped two years ago. He was diagnosed with DISH within the last year and previously used alcohol for pain management.   His  vitamin B12 level is 375, and he is not on B12 supplements. No unintentional weight loss or night sweats. He has not had recent procedures requiring clearance for low platelet counts.  He underwent a colonoscopy three years ago and an EGD two years ago, with a recommendation for another EGD for variceal screening. He is followed by [facility] hepatology.  AFP has been monitored periodically.    INTERVAL HISTORY Kyle Mercer is a 53 y.o. male who has above history reviewed by me today presents for follow up visit for  leukopenia and thrombocytopenia.   He developed an acute fever last night with a maximum temperature of 101F, currently measuring 100F, associated with generalized malaise.  He has experienced persistent vomiting and diarrhea for the past month, which he initially attributed to Simponi Aria infusions started on December 23 2023, with the most recent infusion last week. He is uncertain if the gastrointestinal symptoms and fever are related.     MEDICAL HISTORY:  Past Medical History:  Diagnosis Date   Alcoholic cirrhosis of liver without ascites (HCC) 08/18/2019   Anxiety    GERD (gastroesophageal reflux disease)    History of nephrolithiasis    Hx of adenomatous and sessile serrated colonic polyps 06/11/2021   Hyperlipidemia    Kidney stones    Liver lesion    OSA on CPAP    Seasonal allergies    Substance abuse (HCC)  H/O ETOH   Thrombocytopenia 02/03/2019    SURGICAL HISTORY: Past Surgical History:  Procedure Laterality Date   ESOPHAGOGASTRODUODENOSCOPY  10/2018   ROTATOR CUFF REPAIR Right     SOCIAL HISTORY: Social History   Socioeconomic History   Marital status: Married    Spouse name: Not on file   Number of children: Not on file   Years of education: Not on file   Highest education level: Not on file  Occupational History   Occupation: SOFTWARE Secondary School Teacher: [employer]    Comment: Engineer, [employer]  Tobacco Use    Smoking status: Former    Current packs/day: 0.00    Types: Cigarettes    Start date: 02/17/1993    Quit date: 02/18/2000    Years since quitting: 23.9    Passive exposure: Past   Smokeless tobacco: Never   Tobacco comments:    3-4 cigarettes per day at the most (recreational).  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  No drug use.  Update May 2023-wine only on the weekends   regular exercise- no      Quit alcohol August 2023   Social Drivers of Health   Tobacco Use: Medium Risk (02/01/2024)   Patient History    Smoking Tobacco Use: Former    Smokeless Tobacco Use: Never    Passive Exposure: Past  Physicist, Medical Strain: Low Risk (11/16/2023)   Overall Financial Resource Strain (CARDIA)    Difficulty of Paying Living Expenses: Not very hard  Food Insecurity: No Food Insecurity (11/16/2023)   Epic    Worried About Programme Researcher, Broadcasting/film/video in the Last Year: Never true    Ran Out of Food in the Last Year: Never true  Transportation Needs: No Transportation Needs (11/16/2023)   Epic    Lack of Transportation (Medical): No    Lack of Transportation (Non-Medical): No  Physical Activity: Not on file  Stress: No Stress Concern Present (11/16/2023)   [employer] of Occupational Health - Occupational Stress Questionnaire    Feeling of Stress: Not at all  Social Connections: Not on file  Intimate  Partner Violence: Not At Risk (11/16/2023)   Epic    Fear of Current or Ex-Partner: No    Emotionally Abused: No    Physically Abused: No    Sexually Abused: No  Depression (PHQ2-9): Low Risk (11/16/2023)   Depression (PHQ2-9)    PHQ-2 Score: 0  Alcohol Screen: Not on file  Housing: Low Risk (11/16/2023)   Epic    Unable to Pay for Housing in the Last Year: No    Number of Times Moved in the Last Year: 0    Homeless in the Last Year: No  Utilities: Not At Risk (11/16/2023)   Epic    Threatened  with loss of utilities: No  Health Literacy: Adequate Health Literacy (11/16/2023)   B1300 Health Literacy    Frequency of need for help with medical instructions: Never    FAMILY HISTORY: Family History  Problem Relation Age of Onset   Diabetes Mother    Heart disease Mother    Colon polyps Father    Diabetes Father    Heart disease Father    Diabetes Brother    Esophageal cancer Neg Hx    Pancreatic cancer Neg Hx    Stomach cancer Neg Hx    Liver disease Neg Hx    Colon cancer Neg Hx    Crohn's disease Neg Hx    Rectal cancer Neg Hx    Ulcerative colitis Neg Hx     ALLERGIES:  is allergic to cefdinir.  MEDICATIONS:  Current Outpatient Medications  Medication Sig Dispense Refill   fluticasone  (FLONASE ) 50 MCG/ACT nasal spray PLACE 2 SPRAYS INTO THE NOSE ONCE DAILY. 48 mL 0   golimumab 2 mg/kg in sodium chloride  0.9 %      sertraline  (ZOLOFT ) 50 MG tablet TAKE 1 TABLET BY MOUTH EVERY DAY 90 tablet 3   acetaminophen (TYLENOL) 325 MG tablet 1 tablet as needed Orally every 6 hrs     carvedilol (COREG) 6.25 MG tablet 1 tablet with food Orally daily     famotidine (PEPCID) 20 MG tablet Take 20 mg by mouth daily as needed for heartburn or indigestion. (Patient not taking: Reported on 02/01/2024)     gabapentin (NEURONTIN) 100 MG capsule 1 capsule at bedtime for 3 days then 2 at bedtime Orally Once a day; Duration: 30 days     ipratropium (ATROVENT ) 0.06 % nasal spray Place 2 sprays  into both nostrils 4 (four) times daily. (Patient not taking: Reported on 02/01/2024) 15 mL 0   No current facility-administered medications for this visit.    Review of Systems  Constitutional:  Positive for fatigue. Negative for appetite change, chills, fever and unexpected weight change.  HENT:   Negative for hearing loss and voice change.   Eyes:  Negative for eye problems and icterus.  Respiratory:  Negative for chest tightness, cough and shortness of breath.   Cardiovascular:  Negative for chest pain and leg swelling.  Gastrointestinal:  Positive for diarrhea, nausea and vomiting. Negative for abdominal distention and abdominal pain.  Endocrine: Negative for hot flashes.  Genitourinary:  Negative for difficulty urinating, dysuria and frequency.   Musculoskeletal:  Negative for arthralgias.  Skin:  Negative for itching and rash.  Neurological:  Negative for light-headedness and numbness.  Hematological:  Negative for adenopathy. Bruises/bleeds easily.  Psychiatric/Behavioral:  Negative for confusion.    PHYSICAL EXAMINATION:  Vitals:   02/01/24 1323  BP: (!) 142/72  Pulse: 86  Resp: 19  Temp: 100 F (37.8 C)  SpO2: 98%   Filed Weights   02/01/24 1323  Weight: 234 lb 6.4 oz (106.3 kg)    Physical Exam Constitutional:      General: He is not in acute distress. HENT:     Head: Normocephalic and atraumatic.  Eyes:     General: No scleral icterus. Cardiovascular:     Rate and Rhythm: Normal rate and regular rhythm.     Heart sounds: Murmur heard.  Pulmonary:     Effort: Pulmonary effort is normal. No respiratory distress.     Breath sounds: No wheezing.  Abdominal:     General: Bowel sounds are normal. There is no distension.  Palpations: Abdomen is soft.  Musculoskeletal:        General: No deformity. Normal range of motion.     Cervical back: Normal range of motion and neck supple.  Skin:    General: Skin is warm and dry.     Findings: No erythema or  rash.  Neurological:     Mental Status: He is alert and oriented to person, place, and time. Mental status is at baseline.     Cranial Nerves: No cranial nerve deficit.     Coordination: Coordination normal.  Psychiatric:        Mood and Affect: Mood normal.     LABORATORY DATA:  I have reviewed the data as listed    Latest Ref Rng & Units 11/16/2023   12:03 PM 11/02/2023   11:28 AM 10/27/2022    9:33 AM  CBC  WBC 4.0 - 10.5 K/uL 2.2  2.5  2.7   Hemoglobin 13.0 - 17.0 g/dL 86.6  86.4  85.8   Hematocrit 39.0 - 52.0 % 38.3  39.8  42.0   Platelets 150 - 400 K/uL 39  42.0 Repeated and verified X2.  55.0       Latest Ref Rng & Units 11/02/2023   11:28 AM 10/27/2022    9:33 AM 06/24/2022    9:31 AM  CMP  Glucose 70 - 99 mg/dL 99  879  877   BUN 6 - 23 mg/dL 6  6  10    Creatinine 0.40 - 1.50 mg/dL 9.33  9.22  9.31   Sodium 135 - 145 mEq/L 142  140  133   Potassium 3.5 - 5.1 mEq/L 4.0  4.1  3.6   Chloride 96 - 112 mEq/L 107  104  103   CO2 19 - 32 mEq/L 29  31  24    Calcium 8.4 - 10.5 mg/dL 8.3  8.5  9.0   Total Protein 6.0 - 8.3 g/dL 6.5  6.8  7.1   Total Bilirubin 0.2 - 1.2 mg/dL 2.4  2.3  4.7   Alkaline Phos 39 - 117 U/L 109  101  89   AST 0 - 37 U/L 52  56  59   ALT 0 - 53 U/L 20  23  24        RADIOGRAPHIC STUDIES: I have personally reviewed the radiological images as listed and agreed with the findings in the report. No results found.

## 2024-02-01 NOTE — Telephone Encounter (Signed)
 Noted. Thanks.

## 2024-02-01 NOTE — Telephone Encounter (Signed)
 I am glad to see patient tomorrow.  If he can be seen today (either here or UC), then that would be a  better option.  If he is any worse, then he should be seen sooner.  Routed to PCP as FYI.  Thanks.

## 2024-02-01 NOTE — Assessment & Plan Note (Addendum)
 Likely secondary to cirrhosis. Previous work up showed negative peripheral blood flowcytometry, adequante B12 and folate. No M protein on SPEP.  Recheck levels in a few weeks.

## 2024-02-01 NOTE — Telephone Encounter (Signed)
 Inbound call from patient stating that he is having Diarrhea and nausea and vomiting. Patient has also had a temp of 101 and was requesting to speak to nurse. Please advise.

## 2024-02-01 NOTE — ED Provider Notes (Addendum)
 HPI  SUBJECTIVE:  Kyle Mercer is a 53 y.o. male who presents with lower abdominal pain that has now localized in 2 the right lower quadrant for the past 2 days.  He reports fevers Tmax 101 yesterday, 2 episodes of bilious, but nonbloody emesis yesterday.  He states that the car ride was painful yesterday.  It is not painful today.  He had a loose bowel movement last night and reports odorous urine.  No melena, hematochezia.  He reports anorexia, and decreased p.o. intake, but is tolerating some fluids.  No abdominal distention, new or different back pain, dysuria, urgency, frequency, cloudy urine, hematuria.  He reports increased nasal congestion, but no cough, shortness of breath, body aches, sore throat, change in his baseline headaches.  No testicular pain or swelling.  No antipyretic in the past 6 hours.  He tried Tylenol and Imodium without improvement in his symptoms.  Symptoms are worse with p.o. intake.  He has a past medical history of alcoholic cirrhosis of the liver followed by [facility], portal hypertension, GERD, nephrolithiasis, hyperlipidemia, thrombocytopenia followed by oncology, murmur, ankylosing spondylitis on a DMARD, cholelithiasis, nonobstructing nephrolithiasis, diverticulitis.  He had a colonoscopy 3 years ago that showed polyps.  No history of spontaneous bacterial peritonitis, ascites, abdominal surgeries UTI, pyelonephritis.  PCP: [employer].  He has had 1 month of intermittent diarrhea several times per week and 1 episode of emesis per week that he attributes to starting Simponi.  He was febrile at his hematologist's office at 100 today.  Past Medical History:  Diagnosis Date   Alcoholic cirrhosis of liver without ascites (HCC) 08/18/2019   Anxiety    GERD (gastroesophageal reflux disease)    History of nephrolithiasis    Hx of adenomatous and sessile serrated colonic polyps 06/11/2021   Hyperlipidemia    Kidney stones    Liver lesion    OSA on  CPAP    Seasonal allergies    Substance abuse (HCC)    H/O ETOH   Thrombocytopenia 02/03/2019    Past Surgical History:  Procedure Laterality Date   ESOPHAGOGASTRODUODENOSCOPY  10/2018   ROTATOR CUFF REPAIR Right     Family History  Problem Relation Age of Onset   Diabetes Mother    Heart disease Mother    Colon polyps Father    Diabetes Father    Heart disease Father    Diabetes Brother    Esophageal cancer Neg Hx    Pancreatic cancer Neg Hx    Stomach cancer Neg Hx    Liver disease Neg Hx    Colon cancer Neg Hx    Crohn's disease Neg Hx    Rectal cancer Neg Hx    Ulcerative colitis Neg Hx     Social History[1]  Current Medications[2]  Allergies[3]   ROS  As noted in HPI.   Physical Exam  BP (!) 160/79 (BP Location: Left Arm)   Pulse 94   Temp 98.3 F (36.8 C) (Oral)   Resp 18   Wt 107 kg   SpO2 98%   BMI 34.35 kg/m   Constitutional: Well developed, well nourished, no acute distress Eyes: PERRL, EOMI, conjunctiva normal bilaterally HENT: Normocephalic, atraumatic,mucus membranes moist Respiratory: Clear to auscultation bilaterally, no rales, no wheezing, no rhonchi Cardiovascular: Normal rate and rhythm, no murmurs, no gallops, no rubs GI: Soft, slightly distended, normal bowel sounds, right lower quadrant tenderness with rebound, no guarding.  Negative Rovsing.  Negative Murphy.  Negative tap table test. Back:  no CVAT skin: No rash, skin intact Musculoskeletal: No edema, no tenderness, no deformities Neurologic: Alert & oriented x 3, CN III-XII grossly intact, no motor deficits, sensation grossly intact Psychiatric: Speech and behavior appropriate   ED Course   Medications - No data to display  No orders of the defined types were placed in this encounter.  No results found for this or any previous visit (from the past 24 hours). No results found.  ED Clinical Impression  1. Right lower quadrant abdominal pain   2. Immunocompromised  patient      ED Assessment/Plan     Outside records reviewed.  Additional medical history obtained.  As noted in HPI.  Immunocompromised patient presents with right lower quadrant abdominal pain, fever.  He has rebound in the right lower quadrant.  The differential includes, but is not limited to, appendicitis, obstructing nephrolithiasis, ascites, spontaneous bacterial peritonitis, colitis, diverticulitis, obstruction, COVID infection, influenza, UTI.  Unfortunately, we no longer have labs for a full evaluation.  We do not have any advanced imaging available today either.  Transferring to the emergency department for comprehensive evaluation.  I believe he is stable to go via private vehicle.  Discussed medical decision making, rationale for transfer to the emergency department with the patient.  Advised him to be n.p.o. until his ER evaluation is complete.  he states that he will go [facility] the [facility] emergency department.  He declined nausea medicine here.  No orders of the defined types were placed in this encounter.     *This clinic note was created using Dragon dictation software. Therefore, there may be occasional mistakes despite careful proofreading. ?     Van Knee, MD 02/01/24 1605     [1]  Social History Tobacco Use   Smoking status: Former    Current packs/day: 0.00    Types: Cigarettes    Start date: 02/17/1993    Quit date: 02/18/2000    Years since quitting: 23.9    Passive exposure: Past   Smokeless tobacco: Never   Tobacco comments:    3-4 cigarettes per day at the most (recreational).  03/03/2021 hfb  Vaping Use   Vaping status: Never Used  Substance Use Topics   Alcohol use: Not Currently    Alcohol/week: 21.0 standard drinks of alcohol    Types: 21 Standard drinks or equivalent per week   Drug use: No  [2] No current facility-administered medications for this encounter.  Current Outpatient Medications:    carvedilol (COREG) 6.25 MG tablet, 1 tablet  with food Orally daily, Disp: , Rfl:    gabapentin (NEURONTIN) 100 MG capsule, 1 capsule at bedtime for 3 days then 2 at bedtime Orally Once a day; Duration: 30 days, Disp: , Rfl:    acetaminophen (TYLENOL) 325 MG tablet, 1 tablet as needed Orally every 6 hrs, Disp: , Rfl:    famotidine (PEPCID) 20 MG tablet, Take 20 mg by mouth daily as needed for heartburn or indigestion. (Patient not taking: Reported on 02/01/2024), Disp: , Rfl:    fluticasone  (FLONASE ) 50 MCG/ACT nasal spray, PLACE 2 SPRAYS INTO THE NOSE ONCE DAILY., Disp: 48 mL, Rfl: 0   golimumab 2 mg/kg in sodium chloride  0.9 %, , Disp: , Rfl:    ipratropium (ATROVENT ) 0.06 % nasal spray, Place 2 sprays into both nostrils 4 (four) times daily. (Patient not taking: Reported on 02/01/2024), Disp: 15 mL, Rfl: 0   sertraline  (ZOLOFT ) 50 MG tablet, TAKE 1 TABLET BY MOUTH EVERY DAY, Disp: 90  tablet, Rfl: 3 [3]  Allergies Allergen Reactions   Cefdinir     REACTION: cramping in stomach, vomiting and diarrhea     Van Knee, MD 02/01/24 1605

## 2024-02-01 NOTE — Telephone Encounter (Signed)
 FYI Only or Action Required?: FYI only for provider: appointment scheduled on 12/16.  Patient was last seen in primary care on 11/04/2023 by Watt Mirza, MD.  Called Nurse Triage reporting Abdominal Pain.  Symptoms began a week ago.  Interventions attempted: OTC medications: Imodium.  Symptoms are: unchanged.  Triage Disposition: See HCP Within 4 Hours (Or PCP Triage)  Patient/caregiver understands and will follow disposition?: Yes        Copied from CRM #[id number]. Topic: Clinical - Red Word Triage >> Feb 01, 2024  8:25 AM Cleave MATSU wrote: Red Word that prompted transfer to Nurse Triage: Pt is having gastro issues stomach hurting after eating and drinking , experiencing diahrea also a fever of 101 Reason for Disposition  [1] MILD-MODERATE pain AND [2] constant AND [3] present > 2 hours  Answer Assessment - Initial Assessment Questions 1. LOCATION: Where does it hurt?      Right side, mid-lower     2. RADIATION: Does the pain shoot anywhere else? (e.g., chest, back)     No    3. ONSET: When did the pain begin? (Minutes, hours or days ago)       Fever, 1 day, diarrhea ongoing for several weeks, vomiting x 1 week.     4. SUDDEN: Gradual or sudden onset?     Sudden, after eating     5. PATTERN Does the pain come and go, or is it constant?     Intermittent, occurs after eating    6. SEVERITY: How bad is the pain?  (e.g., Scale 1-10; mild, moderate, or severe)     5/10     7. RECURRENT SYMPTOM: Have you ever had this type of stomach pain before? If Yes, ask: When was the last time? and What happened that time?      No      8. CAUSE: What do you think is causing the stomach pain? (e.g., gallstones, recent abdominal surgery)     Possible side effect of [employer]     9. RELIEVING/AGGRAVATING FACTORS: What makes it better or worse? (e.g., antacids, bending or twisting motion, bowel movement)     Vomiting helps relieve pain,  movement makes pain worse.    10. OTHER SYMPTOMS: Do you have any other symptoms? (e.g., back pain, diarrhea, fever, urination pain, vomiting)  Diarrhea x 2 in the past 24 hours. Fever 101.0.    Patient called in to triage with complaints of abdominal pain, diarrhea, fever. The diarrhea has been ongoing for several weeks, vomiting x 1 week The patient stated began yesterday, in which he is taking Tylenol.  Appointment scheduled for further evaluation on 12/16; He agrees with the plan of care, and will reach out if symptoms worsen or persist.  Protocols used: Abdominal Pain - Male-A-AH

## 2024-02-01 NOTE — Discharge Instructions (Addendum)
 Abdominal pain and fever in an immunocompromised person such as yourself is concerning.  The differential includes appendicitis, obstructing nephrolithiasis, ascites, spontaneous bacterial peritonitis, colitis, diverticulitis.  I believe that you need a more comprehensive workup than what can be done here.  We do not have full laboratory testing or advanced imaging here.  Do not have anything to eat or drink into ER evaluation is complete.  Let them know if your abdominal pain or changes or gets worse.

## 2024-02-01 NOTE — ED Notes (Signed)
 Patient is being discharged from the Urgent Care and sent to the Emergency Department via POV . Per Van Knee, MD , patient is in need of higher level of care due to RLQ pain with fever . Patient is aware and verbalizes understanding of plan of care.  Vitals:   02/01/24 1522  BP: (!) 160/79  Pulse: 94  Resp: 18  Temp: 98.3 F (36.8 C)  SpO2: 98%

## 2024-02-01 NOTE — Assessment & Plan Note (Addendum)
 Follow-up with hepatology.  AFP has been monitored. Patient gets ultrasound liver for [facility] screening. He will get complete abd US  to include spleen this time.

## 2024-02-01 NOTE — Telephone Encounter (Signed)
 I spoke with Kyle Mercer;Kyle Mercer notified as instructed by Dr Cleatus. Kyle Mercer said he was about to call and cancel appt wit Dr Cleatus on 02/02/24; Kyle Mercer just saw hematologist and was advised to be seen today. Kyle Mercer is presently on his way to [facility] and Kyle Mercer asked me to cancel appt with Dr Cleatus for 02/02/24.appt cancelled per Kyle Mercer request and sending FYI note to Dr Cleatus and Dr Watt as PCP.

## 2024-02-01 NOTE — Assessment & Plan Note (Addendum)
 He is immunocompromised.  Recommend patient to go to urgent care for further evaluation. Need to rule out influenza and COVID 19

## 2024-02-02 ENCOUNTER — Emergency Department (HOSPITAL_BASED_OUTPATIENT_CLINIC_OR_DEPARTMENT_OTHER)

## 2024-02-02 ENCOUNTER — Encounter (HOSPITAL_BASED_OUTPATIENT_CLINIC_OR_DEPARTMENT_OTHER): Payer: Self-pay

## 2024-02-02 ENCOUNTER — Ambulatory Visit: Admitting: Family Medicine

## 2024-02-02 ENCOUNTER — Other Ambulatory Visit: Payer: Self-pay

## 2024-02-02 ENCOUNTER — Emergency Department (HOSPITAL_BASED_OUTPATIENT_CLINIC_OR_DEPARTMENT_OTHER)
Admission: EM | Admit: 2024-02-02 | Discharge: 2024-02-02 | Disposition: A | Discharge status: Home / Self Care | Attending: Emergency Medicine | Admitting: Emergency Medicine

## 2024-02-02 DIAGNOSIS — D696 Thrombocytopenia, unspecified: Secondary | ICD-10-CM | POA: Diagnosis not present

## 2024-02-02 DIAGNOSIS — R17 Unspecified jaundice: Secondary | ICD-10-CM | POA: Diagnosis not present

## 2024-02-02 DIAGNOSIS — K529 Noninfective gastroenteritis and colitis, unspecified: Secondary | ICD-10-CM

## 2024-02-02 DIAGNOSIS — R1030 Lower abdominal pain, unspecified: Secondary | ICD-10-CM | POA: Diagnosis present

## 2024-02-02 LAB — URINALYSIS, ROUTINE W REFLEX MICROSCOPIC
Bacteria, UA: NONE SEEN
Bilirubin Urine: NEGATIVE
Glucose, UA: NEGATIVE mg/dL
Ketones, ur: NEGATIVE mg/dL
Leukocytes,Ua: NEGATIVE
Nitrite: NEGATIVE
RBC / HPF: >50 RBC/hpf (ref 0–5)
Specific Gravity, Urine: 1.017 (ref 1.005–1.030)
pH: 7.5 (ref 5.0–8.0)

## 2024-02-02 LAB — CBC
HCT: 39.3 % (ref 39.0–52.0)
Hemoglobin: 13.7 g/dL (ref 13.0–17.0)
MCH: 35.3 pg — ABNORMAL HIGH (ref 26.0–34.0)
MCHC: 34.9 g/dL (ref 30.0–36.0)
MCV: 101.3 fL — ABNORMAL HIGH (ref 80.0–100.0)
Platelets: 44 K/uL — ABNORMAL LOW (ref 150–400)
RBC: 3.88 MIL/uL — ABNORMAL LOW (ref 4.22–5.81)
RDW: 13.9 % (ref 11.5–15.5)
WBC: 4.9 K/uL (ref 4.0–10.5)
nRBC: 0 % (ref 0.0–0.2)

## 2024-02-02 LAB — COMPREHENSIVE METABOLIC PANEL WITH GFR
ALT: 34 U/L (ref 0–44)
AST: 82 U/L — ABNORMAL HIGH (ref 15–41)
Albumin: 3.6 g/dL (ref 3.5–5.0)
Alkaline Phosphatase: 128 U/L — ABNORMAL HIGH (ref 38–126)
Anion gap: 8 (ref 5–15)
BUN: 6 mg/dL (ref 6–20)
CO2: 27 mmol/L (ref 22–32)
Calcium: 8.7 mg/dL — ABNORMAL LOW (ref 8.9–10.3)
Chloride: 104 mmol/L (ref 98–111)
Creatinine, Ser: 0.72 mg/dL (ref 0.61–1.24)
GFR, Estimated: >60 mL/min (ref >60)
Glucose, Bld: 109 mg/dL — ABNORMAL HIGH (ref 70–99)
Potassium: 3.9 mmol/L (ref 3.5–5.1)
Sodium: 139 mmol/L (ref 135–145)
Total Bilirubin: 4.3 mg/dL — ABNORMAL HIGH (ref 0.0–1.2)
Total Protein: 6.9 g/dL (ref 6.5–8.1)

## 2024-02-02 LAB — LIPASE, BLOOD: Lipase: 50 U/L (ref 11–51)

## 2024-02-02 MED ORDER — ONDANSETRON 4 MG PO TBDP: 4.0000 mg | ORAL_TABLET | Freq: Three times a day (TID) | ORAL | 0 refills | Status: AC | PRN

## 2024-02-02 MED ORDER — PIPERACILLIN-TAZOBACTAM 3.375 G IVPB 30 MIN
3.3750 g | Freq: Once | INTRAVENOUS | Status: AC
  Administered 2024-02-02: 16:00:00 3.375 g via INTRAVENOUS
  Filled 2024-02-02: qty 50

## 2024-02-02 MED ORDER — AMOXICILLIN-POT CLAVULANATE 875-125 MG PO TABS: 1.0000 | ORAL_TABLET | Freq: Two times a day (BID) | ORAL | 0 refills | Status: DC

## 2024-02-02 MED ORDER — IOHEXOL 300 MG/ML  SOLN
100.0000 mL | Freq: Once | INTRAMUSCULAR | Status: AC | PRN
  Administered 2024-02-02: 15:00:00 100 mL via INTRAVENOUS

## 2024-02-02 MED ORDER — SODIUM CHLORIDE 0.9 % IV BOLUS
1000.0000 mL | Freq: Once | INTRAVENOUS | Status: AC
  Administered 2024-02-02: 15:00:00 1000 mL via INTRAVENOUS

## 2024-02-02 NOTE — ED Triage Notes (Signed)
 Presents to ED with c/o lower abdominal pain ongoing for multiple weeks and worsening. Also has intermittent fevers, diarrhea, and N/V.

## 2024-02-02 NOTE — ED Provider Notes (Signed)
 [place] EMERGENCY DEPARTMENT AT [facility] Provider Note   CSN: [id number] Arrival date & time: 02/02/24  1227     Patient presents with: Abdominal Pain   Kyle Mercer is a 53 y.o. male patient with history of alcoholic cirrhosis, GERD, hyperlipidemia who presents to the emergency department today for further evaluation of lower abdominal pain which has been present for the last 3 days.  Patient states he recently started an infusion for ankylosing spondylitis last month.  He has total of 2 infusions and has had subsequent intermittent diarrhea since then.  On Sunday he started having lower abdominal pain which was worse with food and cause associated bilious vomiting and sometimes diarrhea.  He was seen evaluated urgent care yesterday he was advised to go to the emergency department at that time the patient did not want to spend the night in the ED so he waited until today.  Spoke with his GI doctor today who advised him to come to the emergency department.  Denies fever today but was having intermittent fevers with the highest up to 101.    Abdominal Pain      Prior to Admission medications  Medication Sig Start Date End Date Taking? Authorizing Provider  amoxicillin -clavulanate (AUGMENTIN ) 875-125 MG tablet Take 1 tablet by mouth every 12 (twelve) hours. 02/02/24  Yes Theotis, Zahniya Zellars M, PA-C  ondansetron  (ZOFRAN -ODT) 4 MG disintegrating tablet Take 1 tablet (4 mg total) by mouth every 8 (eight) hours as needed for nausea or vomiting. 02/02/24  Yes Theotis Peers M, PA-C  acetaminophen (TYLENOL) 325 MG tablet 1 tablet as needed Orally every 6 hrs    [provider]  carvedilol (COREG) 6.25 MG tablet 1 tablet with food Orally daily 12/21/23   [provider]  famotidine (PEPCID) 20 MG tablet Take 20 mg by mouth daily as needed for heartburn or indigestion. Patient not taking: Reported on 02/01/2024    [provider]  fluticasone  (FLONASE ) 50  MCG/ACT nasal spray PLACE 2 SPRAYS INTO THE NOSE ONCE DAILY. 09/07/23   Copland, Jacques, MD  gabapentin (NEURONTIN) 100 MG capsule 1 capsule at bedtime for 3 days then 2 at bedtime Orally Once a day; Duration: 30 days 11/26/23   [provider]  golimumab 2 mg/kg in sodium chloride  0.9 %  12/23/23   [provider]  ipratropium (ATROVENT ) 0.06 % nasal spray Place 2 sprays into both nostrils 4 (four) times daily. Patient not taking: Reported on 02/01/2024 12/16/23   Arvis Huxley B, PA-C  sertraline  (ZOLOFT ) 50 MG tablet TAKE 1 TABLET BY MOUTH EVERY DAY 12/02/23   Copland, Jacques, MD    Allergies: Cefdinir    Review of Systems  Gastrointestinal:  Positive for abdominal pain.  All other systems reviewed and are negative.   Updated Vital Signs BP 137/68 (BP Location: Right Arm)   Pulse 85   Temp 98.4 F (36.9 C) (Oral)   Resp 17   Ht 5' 9 (1.753 m)   Wt 104.3 kg   SpO2 99%   BMI 33.97 kg/m   Physical Exam Vitals and nursing note reviewed.  Constitutional:      General: He is not in acute distress.    Appearance: Normal appearance.  HENT:     Head: Normocephalic and atraumatic.  Eyes:     General:        Right eye: No discharge.        Left eye: No discharge.  Cardiovascular:     Comments:  Regular rate and rhythm.  S1/S2 are distinct without any evidence of murmur, rubs, or gallops.  Radial pulses are 2+ bilaterally.  Dorsalis pedis pulses are 2+ bilaterally.  No evidence of pedal edema. Pulmonary:     Comments: Clear to auscultation bilaterally.  Normal effort.  No respiratory distress.  No evidence of wheezes, rales, or rhonchi heard throughout. Abdominal:     General: Abdomen is flat. Bowel sounds are normal. There is no distension.     Tenderness: There is no abdominal tenderness. There is no guarding or rebound.  Musculoskeletal:        General: Normal range of motion.     Cervical back: Neck supple.  Skin:    General: Skin is warm and dry.      Coloration: Skin is jaundiced.     Findings: No rash.  Neurological:     General: No focal deficit present.     Mental Status: He is alert.  Psychiatric:        Mood and Affect: Mood normal.        Behavior: Behavior normal.     (all labs ordered are listed, but only abnormal results are displayed) Labs Reviewed  COMPREHENSIVE METABOLIC PANEL WITH GFR - Abnormal; Notable for the following components:      Result Value   Glucose, Bld 109 (*)    Calcium 8.7 (*)    AST 82 (*)    Alkaline Phosphatase 128 (*)    Total Bilirubin 4.3 (*)    All other components within normal limits  CBC - Abnormal; Notable for the following components:   RBC 3.88 (*)    MCV 101.3 (*)    MCH 35.3 (*)    Platelets 44 (*)    All other components within normal limits  URINALYSIS, ROUTINE W REFLEX MICROSCOPIC - Abnormal; Notable for the following components:   Hgb urine dipstick MODERATE (*)    Protein, ur TRACE (*)    All other components within normal limits  LIPASE, BLOOD    EKG: None  Radiology: CT ABDOMEN PELVIS W CONTRAST Result Date: 02/02/2024 CLINICAL DATA:  Right lower quadrant abdominal pain. EXAM: CT ABDOMEN AND PELVIS WITH CONTRAST TECHNIQUE: Multidetector CT imaging of the abdomen and pelvis was performed using the standard protocol following bolus administration of intravenous contrast. RADIATION DOSE REDUCTION: This exam was performed according to the departmental dose-optimization program which includes automated exposure control, adjustment of the mA and/or kV according to patient size and/or use of iterative reconstruction technique. CONTRAST:  OMNIPAQUE  IOHEXOL  300 MG/ML  SOLN COMPARISON:  December 30, 2018.  April 27, 2019. FINDINGS: Lower chest: No acute abnormality. Hepatobiliary: Cholelithiasis is noted without definite biliary dilatation. Nodular hepatic contours are noted consistent with hepatic cirrhosis. Innumerable small low densities are noted throughout both hepatic  lobes concerning for possible metastatic disease. Further evaluation with MRI is recommended. Pancreas: Unremarkable. No pancreatic ductal dilatation or surrounding inflammatory changes. Spleen: Moderate splenomegaly is noted. Adrenals/Urinary Tract: Adrenal glands appear normal. Bilateral nonobstructive nephrolithiasis is noted. No hydronephrosis or renal obstruction is noted. Urinary bladder is unremarkable. Stomach/Bowel: The appendix is unremarkable. There is no evidence of bowel obstruction. Mild wall thickening of cecum is noted with surrounding inflammatory changes concerning for possible infectious or inflammatory colitis. There also appears to be wall thickening of second and third portion of duodenum suggesting possible duodenitis or peptic ulcer disease. Vascular/Lymphatic: Portal vein is patent, although extensive collateral veins are noted in left upper quadrant with large paraesophageal  varices present as well, consistent with portal hypertension. Reproductive: Prostate is unremarkable. Other: No definite ascites or hernia. Musculoskeletal: No acute or significant osseous findings. IMPRESSION: 1. Hepatic cirrhosis is noted with moderate splenomegaly and extensive collateral veins in left upper quadrant as well as large paraesophageal varices, consistent with portal hypertension. 2. Innumerable small low densities are noted throughout both hepatic lobes concerning for possible metastatic disease. Further evaluation with MRI is recommended. 3. Cholelithiasis. 4. Bilateral nonobstructive nephrolithiasis. 5. Mild wall thickening of cecum is noted with surrounding inflammatory changes concerning for possible infectious or inflammatory colitis. 6. Wall thickening of second and third portion of duodenum is noted suggesting possible duodenitis or peptic ulcer disease. Electronically Signed   By: Lynwood Landy Raddle M.D.   On: 02/02/2024 14:51     Procedures   Medications Ordered in the ED  sodium chloride   0.9 % bolus 1,000 mL (0 mLs Intravenous Stopped 02/02/24 1549)  iohexol  (OMNIPAQUE ) 300 MG/ML solution 100 mL (100 mLs Intravenous Contrast Given 02/02/24 1430)  piperacillin -tazobactam (ZOSYN ) IVPB 3.375 g (0 g Intravenous Stopped 02/02/24 1643)    Clinical Course as of 02/02/24 1827  Tue Feb 02, 2024  1540 On repeat evaluation, patient is still doing well.  I notified him of labs and imaging.  We talked about the findings on the CT scan specifically around the inflammatory versus infectious colitis.  Given his history of cirrhosis and overall immunocompromise state, I will plan to give him 1 round of IV antibiotics here via Zosyn  and send him home with Augmentin . [CF]  1657 On repeat evaluation, patient finishes antibiotics.  We went over everything 1 more time.  He is aware of the lesions on CT scan and will follow-up in the outpatient setting for MRI.  Will plan to discharge home with GI follow-up.  Patient will be given Augmentin .  I also given him some Zofran  ODT for nausea and vomiting.  Patient agreeable with plan.  He is safe for discharge. [CF]  1657 Urinalysis, Routine w reflex microscopic -Urine, Clean Catch(!) Nitrite negative.  Patient is having no urinary symptoms. [CF]  1657 CBC(!) There is some thrombocytopenia erythrocytopenia.  At patient's baseline. [CF]  1658 Comprehensive metabolic panel(!) There is hyperbilirubinemia slightly elevated from patient's overall baseline. [CF]  1658 Lipase, blood Negative. [CF]  1658 CT ABDOMEN PELVIS W CONTRAST There is some findings suggestive of colitis.  Will place him on antibiotics.  I do agree with the radiologist interpretation. [CF]    Clinical Course User Index [CF] Theotis Cameron HERO, PA-C                                 Medical Decision Making This patient presents to the ED for concern of abdominal pain, this involves an extensive number of treatment options, and is a complaint that carries with it a high risk of complications  and morbidity.  The differential diagnosis is broad and Given work up low suspicion for acute hepatobiliary disease (including acute cholecystitis or cholangitis), does have chronic cirrhosis, acute pancreatitis (neg lipase), PUD (including gastric perforation), acute infectious processes (pneumonia, hepatitis, pyelonephritis), acute appendicitis, vascular catastrophe, bowel obstruction, viscus perforation, or testicular torsion, diverticulitis.   Co morbidities that complicate the patient evaluation  Cirrhosis with cytopenia   Additional history obtained:  Additional history and/or information obtained from chart review, notable for prior visits.   Lab Tests:  I Ordered, and personally interpreted labs.  The pertinent results include: See ED clinical course   Imaging Studies ordered:  I ordered imaging studies including CT scan of the abdomen and pelvis with contrast I independently visualized and interpreted imaging which showed colitis I agree with the radiologist interpretation   Cardiac Monitoring:  The patient was maintained on a cardiac monitor.  I personally viewed and interpreted the cardiac monitored which showed an underlying rhythm of: Normal sinus rhythm   Medicines ordered and prescription drug management:  I ordered medication including antibiotics for presumed infectious colitis Reevaluation of the patient after these medicines showed that the patient improved I have reviewed the patients home medicines and have made adjustments as needed   Test Considered:  None   Critical Interventions:  Antibiotics   Consultations Obtained:  None   Problem List / ED Course:  See ED clinical course   Reevaluation:  After the interventions noted above, I reevaluated the patient and found that they have :improved   Social Determinants of Health:  Health literacy   Disposition:  After consideration of the diagnostic results and the patients response  to treatment, I feel that the patient would benefit from discharge home with GI follow-up and Augmentin .  Also prescribed him Zofran . He is agreeable with plan. All questions and concerns addressed.    Amount and/or Complexity of Data Reviewed Labs: ordered. Decision-making details documented in ED Course. Radiology: ordered. Decision-making details documented in ED Course.  Risk Prescription drug management.     Final diagnoses:  Colitis    ED Discharge Orders          Ordered    amoxicillin -clavulanate (AUGMENTIN ) 875-125 MG tablet  Every 12 hours        02/02/24 1654    ondansetron  (ZOFRAN -ODT) 4 MG disintegrating tablet  Every 8 hours PRN        02/02/24 1654               Theotis Cameron HERO, PA-C 02/02/24 1827    Neysa Caron PARAS, DO 02/03/24 1241

## 2024-02-02 NOTE — Telephone Encounter (Signed)
 Patient advised.

## 2024-02-02 NOTE — Telephone Encounter (Signed)
 He was given correct advice to go to ED - multiple sig medical problems, immunosuppressed and fever - needs quicker evaluation than we can do in office  [facility]) but if he needs admitted will get an ambulance ride to hospital  3 people waiting at [facility] now

## 2024-02-02 NOTE — Telephone Encounter (Signed)
 Patient with cirrhosis and on Simponi through rheumatology calls with complaints of vomiting and diarrhea x a month. Now new onset fever with worsening vomiting. Temperature ranges from 100 to 101. Eating/drinking can trigger the vomiting and diarrhea. He was seen in an urgent care and advised to go to the ER. Patient chose not to go due to the long wait in the ER.  He is still in the induction phase of Simponi. He has not contacted his rheumatologist about the fever. Patient last seen here 2 years ago.

## 2024-02-02 NOTE — Discharge Instructions (Addendum)
 As we discussed, CT scan did show some evidence of inflammatory versus infectious colitis which is just inflammation of the colon.  I am going to place you on some antibiotics like we discussed.  You did receive some IV antibiotics already today.  You can start the oral antibiotics tomorrow.  I have also given you some Zofran  for some nausea and vomiting.  I would like for you to follow-up with your GI doctor for furtherevaluation.  You can return to the emergency department for any worsening symptoms.

## 2024-02-08 ENCOUNTER — Telehealth: Payer: Self-pay | Admitting: Internal Medicine

## 2024-02-08 NOTE — Telephone Encounter (Signed)
 The pt has been scheduled to see Harlene tomorrow for colitis and appt made per pt request to see Dr Avram next available

## 2024-02-08 NOTE — Telephone Encounter (Signed)
 Inbound call from patient stating he was advised to go to ED last week and was diagnosed with colitis and was given Augmentin  but patient would like to speak to nurse and be advised on if he should continue taking medication since every time he takes it he starts to feel a weird abdominal pain. Requesting a call back  Please advise  Thank you

## 2024-02-09 ENCOUNTER — Ambulatory Visit: Admitting: Gastroenterology

## 2024-02-09 ENCOUNTER — Encounter: Payer: Self-pay | Admitting: Gastroenterology

## 2024-02-09 VITALS — BP 118/62 | HR 76 | Ht 69.5 in | Wt 230.0 lb

## 2024-02-09 DIAGNOSIS — R933 Abnormal findings on diagnostic imaging of other parts of digestive tract: Secondary | ICD-10-CM

## 2024-02-09 DIAGNOSIS — D696 Thrombocytopenia, unspecified: Secondary | ICD-10-CM

## 2024-02-09 DIAGNOSIS — K766 Portal hypertension: Secondary | ICD-10-CM

## 2024-02-09 DIAGNOSIS — K769 Liver disease, unspecified: Secondary | ICD-10-CM

## 2024-02-09 DIAGNOSIS — A09 Infectious gastroenteritis and colitis, unspecified: Secondary | ICD-10-CM | POA: Diagnosis not present

## 2024-02-09 DIAGNOSIS — R161 Splenomegaly, not elsewhere classified: Secondary | ICD-10-CM

## 2024-02-09 DIAGNOSIS — K7460 Unspecified cirrhosis of liver: Secondary | ICD-10-CM | POA: Diagnosis not present

## 2024-02-09 DIAGNOSIS — K7030 Alcoholic cirrhosis of liver without ascites: Secondary | ICD-10-CM

## 2024-02-09 NOTE — Patient Instructions (Signed)
 You have been scheduled for an MRI at [facility] on Wednesday 02/24/24. Your appointment time is 9:30 am. Please arrive to admitting (at main entrance of the hospital) 30 minutes prior to your appointment time for registration purposes. Please make certain not to have anything to eat or drink 6 hours prior to your test. In addition, if you have any metal in your body, have a pacemaker or defibrillator, please be sure to let your ordering physician know. This test typically takes 45 minutes to 1 hour to complete. Should you need to reschedule, please call [phone] to do so.

## 2024-02-09 NOTE — Progress Notes (Addendum)
 "    02/09/2024 Kyle Mercer [id number] [birth year]   Discussed the use of AI scribe software for clinical note transcription with the patient, who gave verbal consent to proceed.  History of Present Illness Kyle Mercer is a 53 year old male with cirrhosis, portal hypertension with splenomegaly and thrombocytopenia, and ankylosing spondylitis who presents for follow-up after a recent ER visit for colitis and ongoing gastrointestinal symptoms.  He is followed by hepatology [facility]) and hematology and rheumatology. Carvedilol was prescribed but not started due to concerns about low blood pressure. Most recent platelet count was 44,000. Surveillance endoscopy was performed in 2023, with another recommended by hepatology. Regular blood work is obtained, and platelet transfusion is under consideration for procedures. 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  Since Thanksgiving, he has had multiple episodes of diarrhea each week and vomiting about once weekly. Symptoms worsened to fever and multiple episodes of vomiting per day, prompting urgent care and ER visits. CT scan in the ER showed colitis, and he was started on Augmentin . He is over halfway through a 10-day course, which has caused significant gastrointestinal upset and stomach pain despite taking it with food. No fever since starting antibiotics. Vomiting and nausea have improved, with Zofran  required only on the first day. Bowel movements are all liquid, sometimes darker than normal, without visible blood. No blood in vomitus, which appears as acid and mucus without coffee ground or dark appearance. Persistent tenderness is more pronounced in the lower abdomen.  Recent CT scan identified liver lesions. Imaging also showed an enlarged spleen and collateral vessels. An ultrasound with Doppler had been scheduled to assess splenic size and vascular flow, but MRI may provide sufficient information for both liver and spleen  evaluation.  Ankylosing spondylitis and diffuse idiopathic skeletal hyperostosis are present. Due to cirrhosis, he cannot take standard anti-inflammatory medications. Simponi infusions were started, with two loading doses in November and December. The next infusion was canceled due to recent illness, and no relief of rheumatologic symptoms has been achieved from Simponi thus far.  CT abdomen and pelvis with contrast:  IMPRESSION: 1. Hepatic cirrhosis is noted with moderate splenomegaly and extensive collateral veins in left upper quadrant as well as large paraesophageal varices, consistent with portal hypertension. 2. Innumerable small low densities are noted throughout both hepatic lobes concerning for possible metastatic disease. Further evaluation with MRI is recommended. 3. Cholelithiasis. 4. Bilateral nonobstructive nephrolithiasis. 5. Mild wall thickening of cecum is noted with surrounding inflammatory changes concerning for possible infectious or inflammatory colitis. 6. Wall thickening of second and third portion of duodenum is noted suggesting possible duodenitis or peptic ulcer disease.    Past Medical History:  Diagnosis Date   Alcoholic cirrhosis of liver without ascites (HCC) 08/18/2019   Anxiety    GERD (gastroesophageal reflux disease)    History of nephrolithiasis    Hx of adenomatous and sessile serrated colonic polyps 06/11/2021   Hyperlipidemia    Kidney stones    Liver lesion    OSA on CPAP    Seasonal allergies    Substance abuse (HCC)    H/O ETOH   Thrombocytopenia 02/03/2019   Past Surgical History:  Procedure Laterality Date   ESOPHAGOGASTRODUODENOSCOPY  10/2018   ROTATOR CUFF REPAIR Right     reports that he quit smoking about 23 years ago. His smoking use included cigarettes. He started smoking about 30 years ago. He has been exposed to tobacco smoke. He has never used  smokeless tobacco. He reports that he does not currently use alcohol after a  past usage of about 21.0 standard drinks of alcohol per week. He reports that he does not use drugs. family history includes Colon polyps in his father; Diabetes in his brother, father, and mother; Heart disease in his father and mother. Allergies[1]    Outpatient Encounter Medications as of 02/09/2024  Medication Sig   acetaminophen (TYLENOL) 325 MG tablet 1 tablet as needed Orally every 6 hrs   amoxicillin -clavulanate (AUGMENTIN ) 875-125 MG tablet Take 1 tablet by mouth every 12 (twelve) hours.   carvedilol (COREG) 6.25 MG tablet 1 tablet with food Orally daily   fluticasone  (FLONASE ) 50 MCG/ACT nasal spray PLACE 2 SPRAYS INTO THE NOSE ONCE DAILY.   golimumab 2 mg/kg in sodium chloride  0.9 %    ondansetron  (ZOFRAN -ODT) 4 MG disintegrating tablet Take 1 tablet (4 mg total) by mouth every 8 (eight) hours as needed for nausea or vomiting.   sertraline  (ZOLOFT ) 50 MG tablet TAKE 1 TABLET BY MOUTH EVERY DAY   [DISCONTINUED] famotidine (PEPCID) 20 MG tablet Take 20 mg by mouth daily as needed for heartburn or indigestion. (Patient not taking: Reported on 02/01/2024)   [DISCONTINUED] gabapentin (NEURONTIN) 100 MG capsule 1 capsule at bedtime for 3 days then 2 at bedtime Orally Once a day; Duration: 30 days   [DISCONTINUED] ipratropium (ATROVENT ) 0.06 % nasal spray Place 2 sprays into both nostrils 4 (four) times daily. (Patient not taking: Reported on 02/01/2024)   No facility-administered encounter medications on file as of 02/09/2024.     REVIEW OF SYSTEMS  : All other systems reviewed and negative except where noted in the History of Present Illness.   PHYSICAL EXAM: BP 118/62 (BP Location: Right Arm, Patient Position: Sitting, Cuff Size: Normal)   Pulse 76   Ht 5' 9.5 (1.765 m)   Wt 230 lb (104.3 kg)   BMI 33.48 kg/m  General: Well developed white male in no acute distress Head: Normocephalic and atraumatic Eyes:  Sclerae anicteric, conjunctiva pink. Ears: Normal auditory  acuity Lungs: Clear throughout to auscultation; no W/R/R. Heart: Regular rate and rhythm; no M/R/G. Abdomen: Soft, somewhat distended.  BS present.  Minimal lower abdominal TTP. Musculoskeletal: Symmetrical with no gross deformities  Skin: No lesions on visible extremities Extremities: No edema  Neurological: Alert oriented x 4, grossly non-focal Psychological:  Alert and cooperative. Normal mood and affect  Assessment & Plan Infectious colitis Acute infectious colitis, likely bacterial, with symptom improvement following antibiotic therapy. Ongoing mild gastrointestinal upset attributed to Augmentin ; fever and vomiting have resolved. Immunosuppression from Simponi considered in management. - Advised to complete at least seven days of Augmentin  unless gastrointestinal symptoms become intolerable. - Instructed to take Augmentin  with food to minimize gastrointestinal upset. - Simponi infusions remain on hold pending follow-up and symptom resolution (this is prescribed by rheumatology).  Cirrhosis of liver with portal hypertension, splenomegaly, and thrombocytopenia Chronic cirrhosis with portal hypertension and splenomegaly resulting in persistent thrombocytopenia, increasing procedural bleeding risk and necessitating careful planning for surveillance endoscopy and possible platelet transfusion.  Follows with transplant/liver clinic. - Discussed need for surveillance endoscopy due to thrombocytopenia and risk of varices as requested by Stephane Quest, NP. - Discussed potential need for platelet transfusion during procedure, to be arranged at the hospital outpatient unit if indicated. - Will contact him to finalize endoscopy scheduling and location after consulting with the other provider. - Carvedilol remains on hold due to blood pressure concerns.  Liver lesions under  evaluation Indeterminate liver lesions on CT require further characterization. MRI preferred for detailed evaluation. - Ordered  MRI of the liver to further evaluate lesions and assess portal hypertension and splenomegaly.  Abnormal CT scan showing wall thickening of the second third portion of the duodenum suggesting possible duodenitis or peptic ulcer disease.  No NSAID use.   CC:  Copland, Spencer, MD     [facility] Attending    I agree with the Advanced Practitioner's note, impression and recommendations with the following additions:  I think the most important thing is to get the MRI to understand if he has neoplasia in the liver.  It could be regenerative nodules but hepatocellular carcinoma or some form of metastatic disease is possible.  I think of his platelets stay where they are I am comfortable with a diagnostic endoscopy without platelet transfusion.  The other option would be the medications that increase platelets.  Lupita CHARLENA Commander, MD, [facility]     [1]  Allergies Allergen Reactions   Cefdinir     REACTION: cramping in stomach, vomiting and diarrhea   "

## 2024-02-16 ENCOUNTER — Other Ambulatory Visit (HOSPITAL_COMMUNITY)

## 2024-02-20 NOTE — Telephone Encounter (Signed)
 US  was ordered at time of office visit in November and letter sent to patient on 01/30/2024 as no results or scheduling info had been received.  Patient had a single phase contrast CT performed in the ED on 02/02/2024 for evaluation of lower abdominal pain which reported . Innumerable small low densities are noted throughout both hepatic lobes concerning for possible metastatic  Disease our office was not made aware of this ED visit or results.    He was seen by GI for follow up of ED visit who ordered MRI w and w/o contrast currently scheduled for 02/24/2024.  Given new CT finding MRI is more appropriate that US .   Of note patient with chronic mild elevation of AFP dating back to at least 2023 and stable with most recent AFP lowest since 2023.  Additionally patient had similar reported appearance to the liver on single phase contrast CT in 2023.

## 2024-02-22 ENCOUNTER — Other Ambulatory Visit

## 2024-02-22 ENCOUNTER — Telehealth: Payer: Self-pay

## 2024-02-22 ENCOUNTER — Inpatient Hospital Stay: Attending: Oncology

## 2024-02-22 DIAGNOSIS — D696 Thrombocytopenia, unspecified: Secondary | ICD-10-CM

## 2024-02-22 LAB — CBC WITH DIFFERENTIAL (CANCER CENTER ONLY)
Abs Immature Granulocytes: 0.01 K/uL (ref 0.00–0.07)
Basophils Absolute: 0 K/uL (ref 0.0–0.1)
Basophils Relative: 1 %
Eosinophils Absolute: 0.1 K/uL (ref 0.0–0.5)
Eosinophils Relative: 3 %
HCT: 39.5 % (ref 39.0–52.0)
Hemoglobin: 13.4 g/dL (ref 13.0–17.0)
Immature Granulocytes: 0 %
Lymphocytes Relative: 28 %
Lymphs Abs: 0.6 K/uL — ABNORMAL LOW (ref 0.7–4.0)
MCH: 34.6 pg — ABNORMAL HIGH (ref 26.0–34.0)
MCHC: 33.9 g/dL (ref 30.0–36.0)
MCV: 102.1 fL — ABNORMAL HIGH (ref 80.0–100.0)
Monocytes Absolute: 0.4 K/uL (ref 0.1–1.0)
Monocytes Relative: 16 %
Neutro Abs: 1.2 K/uL — ABNORMAL LOW (ref 1.7–7.7)
Neutrophils Relative %: 52 %
Platelet Count: 40 K/uL — ABNORMAL LOW (ref 150–400)
RBC: 3.87 MIL/uL — ABNORMAL LOW (ref 4.22–5.81)
RDW: 13.5 % (ref 11.5–15.5)
WBC Count: 2.3 K/uL — ABNORMAL LOW (ref 4.0–10.5)
nRBC: 0 % (ref 0.0–0.2)

## 2024-02-22 LAB — CMP (CANCER CENTER ONLY)
ALT: 38 U/L (ref 0–44)
AST: 97 U/L — ABNORMAL HIGH (ref 15–41)
Albumin: 3.5 g/dL (ref 3.5–5.0)
Alkaline Phosphatase: 114 U/L (ref 38–126)
Anion gap: 7 (ref 5–15)
BUN: 7 mg/dL (ref 6–20)
CO2: 26 mmol/L (ref 22–32)
Calcium: 9.1 mg/dL (ref 8.9–10.3)
Chloride: 101 mmol/L (ref 98–111)
Creatinine: 0.81 mg/dL (ref 0.61–1.24)
GFR, Estimated: >60 mL/min
Glucose, Bld: 127 mg/dL — ABNORMAL HIGH (ref 70–99)
Potassium: 3.7 mmol/L (ref 3.5–5.1)
Sodium: 134 mmol/L — ABNORMAL LOW (ref 135–145)
Total Bilirubin: 4.9 mg/dL (ref 0.0–1.2)
Total Protein: 6.9 g/dL (ref 6.5–8.1)

## 2024-02-22 LAB — FOLATE: Folate: 13 ng/mL

## 2024-02-22 LAB — VITAMIN B12: Vitamin B-12: 2050 pg/mL — ABNORMAL HIGH (ref 180–914)

## 2024-02-22 NOTE — Telephone Encounter (Signed)
 Received critical lab from Luke Horn at [place] lab. MD notified.   Total bili 4.9

## 2024-02-24 ENCOUNTER — Other Ambulatory Visit: Payer: Self-pay | Admitting: *Deleted

## 2024-02-24 ENCOUNTER — Ambulatory Visit (HOSPITAL_COMMUNITY)
Admission: RE | Admit: 2024-02-24 | Discharge: 2024-02-24 | Disposition: A | Discharge status: Home / Self Care | Source: Ambulatory Visit | Attending: Gastroenterology | Admitting: Gastroenterology

## 2024-02-24 ENCOUNTER — Telehealth: Payer: Self-pay | Admitting: *Deleted

## 2024-02-24 ENCOUNTER — Ambulatory Visit: Payer: Self-pay | Admitting: Oncology

## 2024-02-24 DIAGNOSIS — K7030 Alcoholic cirrhosis of liver without ascites: Secondary | ICD-10-CM

## 2024-02-24 DIAGNOSIS — K769 Liver disease, unspecified: Secondary | ICD-10-CM

## 2024-02-24 MED ORDER — GADOBUTROL 1 MMOL/ML IV SOLN
10.0000 mL | Freq: Once | INTRAVENOUS | Status: AC | PRN
  Administered 2024-02-24: 10 mL via INTRAVENOUS

## 2024-02-24 NOTE — Telephone Encounter (Signed)
 Zehr, Jessica D, PA-C  Kyle Mercer, CMA Seems like he is okay with scheduling endoscopy at the hospital?  Maybe we can do a stat CBC that morning of the procedure?

## 2024-02-24 NOTE — Telephone Encounter (Signed)
 Spoke with patient and he agreed to proceed with EGD. Procedure scheduled for 03/31/24 @ 10:30 am

## 2024-02-25 ENCOUNTER — Ambulatory Visit: Payer: Self-pay | Admitting: Gastroenterology

## 2024-02-29 ENCOUNTER — Telehealth: Payer: Self-pay

## 2024-02-29 ENCOUNTER — Telehealth: Payer: Self-pay | Admitting: Pharmacy Technician

## 2024-02-29 ENCOUNTER — Telehealth: Payer: Self-pay | Admitting: Oncology

## 2024-02-29 ENCOUNTER — Other Ambulatory Visit: Payer: Self-pay

## 2024-02-29 ENCOUNTER — Other Ambulatory Visit (HOSPITAL_COMMUNITY): Payer: Self-pay

## 2024-02-29 ENCOUNTER — Telehealth: Payer: Self-pay | Admitting: Pharmacist

## 2024-02-29 DIAGNOSIS — D696 Thrombocytopenia, unspecified: Secondary | ICD-10-CM

## 2024-02-29 MED ORDER — DOPTELET 20 MG PO TABS: 40.0000 mg | ORAL_TABLET | Freq: Every day | ORAL | 0 refills | Status: DC

## 2024-02-29 NOTE — Telephone Encounter (Signed)
 Clinical Pharmacist Practitioner Encounter   Received new prescription for Doptelet  (avatrombopag) for the treatment of thrombocytopenia from liver cirrhosis, planned duration of 5 days. Currently procedure scheduled for 03/31/24  CBC from 02/22/2024 assessed, platelet count 40 prescribed as appropriate for platelet count. Prescription dose and frequency assessed.   Current medication list in Epic reviewed, no DDIs with avatrombopag identified.  Evaluated chart and no patient barriers to medication adherence identified.   Prescription has been e-scribed to the [facility] for benefits analysis and approval.  Oral Oncology Clinic will continue to follow for insurance authorization, copayment issues, initial counseling and start date.   Levy Cedano N. Annaston Upham, PharmD, BCOP, CPP Hematology/Oncology Clinical Pharmacist [facility] Oral Chemotherapy Navigation Clinic [phone]  02/29/2024 12:39 PM

## 2024-02-29 NOTE — Telephone Encounter (Signed)
-----   Message from [place] E sent at 02/29/2024  1:03 PM EST ----- Regarding: RE: PLT increase options Lab only appt scheduled 2 days prior to procedure.  Follow up appts scheduled.  Left voicemail for pt with appt details. Phone note documented. ----- Message ----- From: Rodgers Renaee SAILOR, RPH-CPP Sent: 02/29/2024  12:36 PM EST To: Almarie JINNY Nett, RN; Fonda KANDICE Sax, CMA# Subject: RE: PLT increase options                       We will get to work on it! ----- Message ----- From: Babara Call, MD Sent: 02/27/2024   3:59 PM EST To: Almarie JINNY Nett, RN; Fonda KANDICE Sax, CMA# Subject: EMELDA: PLT increase options                       Patient has liver cirrhosis and will need TPO prior to his procedure which is currently schedule on 2/12.  Alyson and [name], please check insurance approval for avatrombopag, plan 40 mg once daily for 5 days, start 10-13 days prior to procedure.   Please arrange him to do cbc w hold tube 1-2 days prior. please arrange him to follow up in 3 months lab MD cbc, hold tube  thanks. ----- Message ----- From: Avram Lupita BRAVO, MD Sent: 02/26/2024   3:09 PM EST To: Call Babara, MD Subject: PLT increase options                           Dr. Babara,  He will need EGD and colonoscopy - I think current PLT levels of 40 K are ok for EGD and colonoscopy w/ biopsy but should I need to do more that could be a problem.  I am going to see him in office 1/15 - I hope  What are your thoughts about avotrmbopag or lusotrombopag?  He is currently on for procedures at hospital 2/12 - I could potentially do him earlier in our ambulatory center but want to eyball him to see if that is ok. Would be safer in hospital in case of problems. Thanks for your help.  Lupita

## 2024-02-29 NOTE — Telephone Encounter (Signed)
 Oral Oncology Patient Advocate Encounter   New authorization   Received notification that prior authorization for Doptelet  is required.   PA submitted on CMM via [employer] [id number] Status is pending     Kyle Mercer ([name]) [name], CPhT  [facility], [name], [facility] - Oral Chemotherapy Patient Advocate Specialist III Phone: [phone]  Fax: [phone]

## 2024-02-29 NOTE — Telephone Encounter (Signed)
 Lvm for pt regarding new appts that were added. Lab only on 2/10 and 3 month follow up on 4/20. Scheduling phone number provided if pt needs to change these dates/times.  Appts added per inbasket from MD.

## 2024-03-01 ENCOUNTER — Other Ambulatory Visit (HOSPITAL_COMMUNITY): Payer: Self-pay

## 2024-03-01 MED ORDER — DOPTELET 20 MG PO TABS: 40.0000 mg | ORAL_TABLET | Freq: Every day | ORAL | 0 refills | Status: AC

## 2024-03-01 NOTE — Telephone Encounter (Signed)
 Oral Oncology Patient Advocate Encounter  Prior Authorization for Doptelet  has been approved.    PA# [id number] Effective dates: 02/29/2024 through 03/30/2024  Patient must fill through [facility].   Kyle Mercer ([name]) [name], CPhT  [facility], [name], [facility] - Oral Chemotherapy Patient Advocate Specialist III Phone: [phone]  Fax: [phone]

## 2024-03-03 ENCOUNTER — Ambulatory Visit (INDEPENDENT_AMBULATORY_CARE_PROVIDER_SITE_OTHER): Admitting: Internal Medicine

## 2024-03-03 ENCOUNTER — Ambulatory Visit: Payer: Self-pay | Admitting: Internal Medicine

## 2024-03-03 ENCOUNTER — Encounter: Payer: Self-pay | Admitting: Internal Medicine

## 2024-03-03 ENCOUNTER — Other Ambulatory Visit (INDEPENDENT_AMBULATORY_CARE_PROVIDER_SITE_OTHER)

## 2024-03-03 VITALS — BP 110/52 | HR 67 | Ht 69.0 in | Wt 232.4 lb

## 2024-03-03 DIAGNOSIS — R933 Abnormal findings on diagnostic imaging of other parts of digestive tract: Secondary | ICD-10-CM

## 2024-03-03 DIAGNOSIS — K7030 Alcoholic cirrhosis of liver without ascites: Secondary | ICD-10-CM

## 2024-03-03 DIAGNOSIS — K766 Portal hypertension: Secondary | ICD-10-CM | POA: Diagnosis not present

## 2024-03-03 DIAGNOSIS — K3189 Other diseases of stomach and duodenum: Secondary | ICD-10-CM

## 2024-03-03 DIAGNOSIS — D696 Thrombocytopenia, unspecified: Secondary | ICD-10-CM

## 2024-03-03 DIAGNOSIS — K8020 Calculus of gallbladder without cholecystitis without obstruction: Secondary | ICD-10-CM

## 2024-03-03 LAB — HEPATIC FUNCTION PANEL
ALT: 27 U/L (ref 3–53)
AST: 59 U/L — ABNORMAL HIGH (ref 5–37)
Albumin: 3.1 g/dL — ABNORMAL LOW (ref 3.5–5.2)
Alkaline Phosphatase: 84 U/L (ref 39–117)
Bilirubin, Direct: 1.3 mg/dL — ABNORMAL HIGH (ref 0.1–0.3)
Total Bilirubin: 3.8 mg/dL — ABNORMAL HIGH (ref 0.2–1.2)
Total Protein: 6 g/dL (ref 6.0–8.3)

## 2024-03-03 LAB — PROTIME-INR
INR: 1.7 ratio — ABNORMAL HIGH (ref 0.8–1.0)
Prothrombin Time: 17.6 s — ABNORMAL HIGH (ref 9.6–13.1)

## 2024-03-03 NOTE — Progress Notes (Signed)
 [address]       Kyle Mercer 54 y.o. [birth year] [id number]  Assessment & Plan:   Encounter Diagnoses  Name Primary?   Alcoholic cirrhosis of liver without ascites (HCC) Yes   Thrombocytopenia    Abnormal CT scan, small bowel    Portal hypertensive gastropathy (HCC)    Asymptomatic cholelithiasis     Cirrhosis with slight episodic peripheral edema and slightly increased bilirubin. 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 Risk of progression noted. - Will communicate with hepatology regarding elevated bilirubin. - Ordered repeat liver chemistries and prothrombin time. - Provided guidance on sodium restriction (=2 grams/day) and monitoring for edema. - Advised monitoring for hepatic decompensation or increased edema. 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 Doptelet  treatment increase platelets prior to upcoming EGD in February considered pending insurance approval. - Coordinated with hematology regarding initiation of Doptelet , pending insurance approval.  Colitis, resolved clinically Prior colitis episode resolved with normal bowel movements and no active disease.  Colonoscopy 2023 with 2 adenomas and 1 sessile serrated polyp plans for 2020 8 repeat do not think we need to repeat colonoscopy because of the CT findings and the clinical scenario  Hyponatremia secondary to cirrhosis Chronic mild hyponatremia due to liver disease. Sodium increase contraindicated. - Educated on sodium restriction and rationale against increasing dietary sodium. - Placed reminder in chart regarding sodium management.  Gallstones, asymptomatic Incidental gallstones, asymptomatic with no complications. No intervention needed. - Provided  reassurance and advised no intervention unless symptoms develop.  CC: Copland, Jacques, MD Stephane Quest NP Dr. KANDICE Setter [facility]    Subjective:   Chief Complaint: Follow-up of abnormal liver CT, subsequent MRI and abnormal CT duodenum and colon  HPI 54 year old white man with cirrhosis portal hypertension, splenomegaly and thrombocytopenia who also has ankylosing spondylitis.  He was seen in our clinic by Harlene Rand PA-C on 02/09/2024 for diarrhea and abdominal pain, CT scan in the emergency department had shown colitis versus diverticulitis he was given Augmentin  which caused side effects.  He had multiple liver lesions on the CT scan and these were suspected to be regenerative nodules.  His bilirubin has been rising.  He currently has an appointment to have EGD and colonoscopy to evaluate wall thickening of cecum and wall thickening of the duodenum seen on the CT in December.  He had also had a request from [facility] to perform a surveillance endoscopy due to thrombocytopenia and risk of varices.  Lab Results  Component Value Date   ALT 27 03/03/2024   AST 59 (H) 03/03/2024   ALKPHOS 84 03/03/2024   BILITOT 3.8 (H) 03/03/2024      Latest Ref Rng & Units 02/22/2024   11:05 AM 02/02/2024   12:43 PM 11/16/2023   12:03 PM  CBC  WBC 4.0 - 10.5 K/uL 2.3  4.9  2.2   Hemoglobin 13.0 - 17.0 g/dL 86.5  86.2  86.6   Hematocrit 39.0 - 52.0 % 39.5  39.3  38.3   Platelets 150 - 400 K/uL 40  44  39    Lab Results  Component Value Date   INR 1.7 (H) 03/03/2024   INR 1.3 (H) 01/30/2022   INR 1.4 (H) 05/31/2021   Discussed the use of AI scribe software  for clinical note transcription with the patient, who gave verbal consent to proceed.   Gastrointestinal Symptoms: - Recent episode of diarrhea, nausea, and vomiting requiring emergency room evaluation - Imaging revealed thickening in the duodenum, and in right colon concerning for colitis - treated with Augmentin  -  Symptoms included significant diarrhea, cramping, and nausea, necessitating proximity to a restroom and limiting oral intake to sips of water - Since December, gastrointestinal symptoms have largely resolved with normalization of bowel habits and formed stools  Hepatic Disease and Laboratory Findings: - Cirrhosis with prior CT scan showing liver nodules, further evaluated by MRI - Bilirubin remains mildly elevated - Platelet count is low but stable - Known mild hyponatremia on laboratory testing  Peripheral Edema: - Intermittent swelling of the feet over the past week, more pronounced on the left after prolonged sitting - Swelling resolves overnight with elevation - Mindful of sodium intake, does not add salt to food, and has reduced chip consumption  Gallstones: - Known gallstone identified on ultrasound last summer - he asks if this is a problem - No current symptoms of biliary colic, including no postprandial right upper quadrant pain or pain radiating to the back  Musculoskeletal Pain and Stiffness: - History of ankylosing spondylitis and diffuse idiopathic skeletal hyperostosis (DISH) - Ongoing pain and stiffness, particularly in the neck, lower back, and hips - NSAIDs previously used but now avoided - Received two loading doses of Simponi, but treatment was paused due to concerns about immunosuppression and colitis, and no benefit was experienced - Declined gabapentin due to concerns about neurological side effects      Allergies[1] Active Medications[2] Past Medical History:  Diagnosis Date   Alcoholic cirrhosis of liver without ascites (HCC) 08/18/2019   Ankylosing spondylitis (HCC)    Anxiety    DISH (diffuse idiopathic skeletal hyperostosis)    GERD (gastroesophageal reflux disease)    History of nephrolithiasis    Hx of adenomatous and sessile serrated colonic polyps 06/11/2021   Hyperlipidemia    Kidney stones    Liver lesion    OSA on CPAP    Seasonal allergies     Substance abuse (HCC)    H/O ETOH   Thrombocytopenia 02/03/2019   Past Surgical History:  Procedure Laterality Date   COLONOSCOPY     ESOPHAGOGASTRODUODENOSCOPY  10/2018   ROTATOR CUFF REPAIR Right    Social History   Social History Narrative   Married, 1 biologic son born 30, 1 daughter by marriage born 1982 when he has grandchildren    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  No drug use.  Update May 2023-wine only on the weekends   regular exercise- no      Quit alcohol August 2023   family history includes Colon polyps in his father; Diabetes in his brother, father, and mother; Heart disease in his father and mother.   Review of Systems As per HPI  Objective:   Physical Exam @BP  (!) 110/52   Pulse 67   Ht 5' 9 (1.753 m)   Wt 232 lb 6 oz (105.4 kg)   BMI 34.32 kg/m @  General:  NAD Eyes:   Faintly icteric Lungs:  clear Heart::  S1S2 no rubs, murmurs or gallops Abdomen:  soft and nontender, BS+, diastasis recti, no HSM/mass Ext:   no edema,  cyanosis or clubbing    Data Reviewed:  See HPI and assessment and plan    [1]  Allergies Allergen Reactions   Cefdinir     REACTION: cramping in stomach, vomiting and diarrhea  [2]  Current Meds  Medication Sig   acetaminophen (TYLENOL) 325 MG tablet 1 tablet as needed Orally every 6 hrs   avatrombopag maleate  (DOPTELET ) 20 MG tablet Take 2 tablets (40 mg total) by mouth daily. Take with food.  Take for 5 days.   carvedilol (COREG) 6.25 MG tablet 1 tablet with food Orally daily   fluticasone  (FLONASE ) 50 MCG/ACT nasal spray PLACE 2 SPRAYS INTO THE NOSE ONCE DAILY.   ondansetron  (ZOFRAN -ODT) 4 MG disintegrating tablet Take 1 tablet (4 mg total) by mouth every 8 (eight) hours as needed for nausea or vomiting.   sertraline  (ZOLOFT ) 50 MG tablet TAKE 1  TABLET BY MOUTH EVERY DAY   "

## 2024-03-03 NOTE — Patient Instructions (Signed)
 Your provider has requested that you go to the basement level for lab work before leaving today. Press "B" on the elevator. The lab is located at the first door on the left as you exit the elevator.  Due to recent changes in healthcare laws, you may see the results of your imaging and laboratory studies on MyChart before your provider has had a chance to review them.  We understand that in some cases there may be results that are confusing or concerning to you. Not all laboratory results come back in the same time frame and the provider may be waiting for multiple results in order to interpret others.  Please give [place] 48 hours in order for your provider to thoroughly review all the results before contacting the office for clarification of your results.   I appreciate the opportunity to care for you. Stan Head, MD, [facility]

## 2024-03-04 LAB — AFP TUMOR MARKER: AFP-Tumor Marker: 13.4 ng/mL — ABNORMAL HIGH

## 2024-03-07 ENCOUNTER — Encounter: Payer: Self-pay | Admitting: Pharmacist

## 2024-03-07 DIAGNOSIS — K7030 Alcoholic cirrhosis of liver without ascites: Secondary | ICD-10-CM

## 2024-03-07 DIAGNOSIS — D696 Thrombocytopenia, unspecified: Secondary | ICD-10-CM

## 2024-03-07 NOTE — Telephone Encounter (Signed)
 Encounter opened in error.

## 2024-03-07 NOTE — Progress Notes (Signed)
 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 Currently procedure scheduled for 03/31/24   Patient will start Doptelet  on 03/20/2024.  Treatment goal: Supportive  Counseled patient on administration, dosing, side effects, monitoring, drug-food interactions, safe handling, storage, and disposal. Patient will take 2 tablets (40 mg total) by mouth daily. Take with food. Take for 5 days.   Side effects include but not limited to: headache and fatigue.    Reviewed with patient importance of keeping a medication schedule and plan for any missed doses.  After discussion with patient no patient barriers to medication adherence identified.   Distress evaluation: N/A  Communication and Learning Assessment Primary learner: patient Barriers to learning: No barriers Preferred language: English Learning preferences: Listening Reading   Mr. Mccravy voiced understanding and appreciation. All questions answered. Medication handout provided.  Provided patient with [facility] phone number. Patient knows to call the office with questions or concerns. Reviewed with patient the expectations for rescheduling or cancelling appointments.  [facility] will continue to follow.  Khristian Seals N. Obryan Radu, PharmD, BCOP, CPP Hematology/Oncology Clinical Pharmacist [facility] Oral [facility]otherapy [facility] [phone]  03/07/2024 2:04 PM

## 2024-03-07 NOTE — Telephone Encounter (Signed)
 Spoke with Mr. Boomhower today, he reports he has not heard from [facility] and has not tried to call them.  Instructed patient to give [facility] a phone call.  He was provided their phone number and a previous MyChart message.  He knows to call the office if he has issues getting his medication set up for delivery  Patient's procedure date is to 02/29/24, based on this instructed patient to start Doptelet  on 03/20/2024, this would be within the 10 to 13-day prescribed start window pre-procedure.

## 2024-03-07 NOTE — Patient Instructions (Signed)
 [facility] - A DEPT OF [facility]    Thank you for choosing [facility] to provide your oncology/hematology care and for allowing us  to participate in your care today!  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 Currently procedure scheduled for 03/31/24   Treatment goal: Supportive  Medication handout has been provided.   **For oral cancer medication prescription refill requests, contact your pharmacy and they will contact our office if needed. Allow 5-7 days for refills to be completed by your specialty pharmacy.    Cancer Center General Instructions:  If you have an appointment at the [facility], please go directly to the Cancer Center and check in at the registration area.  We strive to give you quality time with your provider. You may need to reschedule your appointment if you arrive late (15 or more minutes).  Arriving late affects you and other patients whose appointments are after yours.  Also, if you miss three or more appointments without notifying the office, you may be dismissed from the clinic at the provider's discretion.      BELOW ARE SYMPTOMS THAT SHOULD BE REPORTED IMMEDIATELY: *FEVER GREATER THAN 100.4 F (38 C) OR HIGHER *CHILLS OR SWEATING *NAUSEA AND VOMITING THAT IS NOT CONTROLLED WITH YOUR NAUSEA MEDICATION *UNUSUAL SHORTNESS OF BREATH *UNUSUAL BRUISING OR BLEEDING *URINARY PROBLEMS (pain or burning when urinating, or frequent urination) *BOWEL PROBLEMS (unusual diarrhea, constipation, pain near the anus) TENDERNESS IN MOUTH AND THROAT WITH OR WITHOUT PRESENCE OF ULCERS (sore throat, sores in mouth, or a toothache) UNUSUAL RASH, SWELLING OR PAIN  UNUSUAL VAGINAL DISCHARGE OR ITCHING   Items with * indicate a potential emergency and should be followed up as soon as possible or go to the  Emergency Department if any problems should occur.  Please show the CHEMOTHERAPY ALERT CARD at check-in to the Emergency Department and triage nurse.  Should you have questions after your visit or need to cancel or reschedule your appointment, please contact [facility] - A DEPT OF [facility]  [phone] and follow the prompts.  Office hours are 8:00 a.m. to 4:30 p.m. Monday - Friday. Please note that voicemails left after 4:00 p.m. may not be returned until the following business day.  We are closed weekends and major holidays. You have access to a nurse at all times for urgent questions. Please call the main number to the clinic [phone] and follow the prompts.  For any non-urgent questions, you may also contact your provider using MyChart. We now offer e-Visits for anyone 51 and older to request care online for non-urgent symptoms. For details visit [email or web address].   Also download the MyChart app! Go to the app store, search MyChart, open the app, select [place], and log in with your MyChart username and password.

## 2024-03-11 NOTE — Telephone Encounter (Signed)
 [facility] online portal states that patient's order status was close due to the pharmacy not being able to reach the patient.  Called Kyle Mercer and left a voicemail asking him to please give [facility] a call to set up his medication shipment.  Stressed to patient that I am worried he may not be able to get medication in time if he does not call them soon.  Asked that he call me back to let me know when he is established shipment with the pharmacy.  Additionally will send patient another MyChart message.

## 2024-03-14 NOTE — Telephone Encounter (Signed)
 I was able to speak with Mr. Septer today. He has not yet spoken to [facility]. He told me he would give him a call today. Patient is aware of the time constraints of getting the medication delivered in time to start as planned on 03/20/24.   Will not call patient for further reminders about contacting [facility]. He know to call me back if he has any issues once he calls the pharmacy.

## 2024-03-18 ENCOUNTER — Other Ambulatory Visit: Payer: Self-pay

## 2024-03-22 ENCOUNTER — Encounter: Payer: Self-pay | Admitting: Internal Medicine

## 2024-03-22 ENCOUNTER — Telehealth: Payer: Self-pay | Admitting: *Deleted

## 2024-03-23 ENCOUNTER — Other Ambulatory Visit: Payer: Self-pay | Admitting: *Deleted

## 2024-03-23 ENCOUNTER — Telehealth: Payer: Self-pay | Admitting: Gastroenterology

## 2024-03-23 DIAGNOSIS — K7030 Alcoholic cirrhosis of liver without ascites: Secondary | ICD-10-CM

## 2024-03-23 NOTE — Telephone Encounter (Addendum)
 Procedure:Endoscopy Procedure date: 03/31/24 Procedure location: [facility] Arrival Time: 9:15 am Spoke with the patient Y/N: Yes Any prep concerns? No  Has the patient obtained the prep from the pharmacy ? No prep needed Do you have a care partner and transportation: Yes Any additional concerns? No

## 2024-03-29 ENCOUNTER — Inpatient Hospital Stay

## 2024-03-29 ENCOUNTER — Ambulatory Visit: Admitting: Internal Medicine

## 2024-03-30 ENCOUNTER — Inpatient Hospital Stay

## 2024-03-31 ENCOUNTER — Ambulatory Visit (HOSPITAL_COMMUNITY): Admission: RE | Admit: 2024-03-31 | Admitting: Internal Medicine

## 2024-03-31 ENCOUNTER — Encounter (HOSPITAL_COMMUNITY): Admission: RE | Payer: Self-pay | Source: Home / Self Care

## 2024-03-31 SURGERY — EGD (ESOPHAGOGASTRODUODENOSCOPY)
Anesthesia: Monitor Anesthesia Care

## 2024-06-06 ENCOUNTER — Inpatient Hospital Stay: Admitting: Oncology

## 2024-06-06 ENCOUNTER — Inpatient Hospital Stay
